# Patient Record
Sex: Female | Born: 1940
Health system: Southern US, Community
[De-identification: ages and names within clinical notes are randomized; demographics above are authoritative.]

## PROBLEM LIST (undated history)

## (undated) DIAGNOSIS — E669 Obesity, unspecified: Secondary | ICD-10-CM

## (undated) DIAGNOSIS — Z9289 Personal history of other medical treatment: Secondary | ICD-10-CM

## (undated) DIAGNOSIS — K921 Melena: Secondary | ICD-10-CM

## (undated) DIAGNOSIS — C20 Malignant neoplasm of rectum: Secondary | ICD-10-CM

## (undated) DIAGNOSIS — C49A0 Gastrointestinal stromal tumor, unspecified site: Secondary | ICD-10-CM

## (undated) DIAGNOSIS — R601 Generalized edema: Secondary | ICD-10-CM

## (undated) DIAGNOSIS — K922 Gastrointestinal hemorrhage, unspecified: Secondary | ICD-10-CM

## (undated) DIAGNOSIS — D509 Iron deficiency anemia, unspecified: Secondary | ICD-10-CM

## (undated) DIAGNOSIS — D500 Iron deficiency anemia secondary to blood loss (chronic): Secondary | ICD-10-CM

## (undated) DIAGNOSIS — K2970 Gastritis, unspecified, without bleeding: Secondary | ICD-10-CM

## (undated) DIAGNOSIS — K219 Gastro-esophageal reflux disease without esophagitis: Secondary | ICD-10-CM

## (undated) DIAGNOSIS — K649 Unspecified hemorrhoids: Secondary | ICD-10-CM

## (undated) DIAGNOSIS — N179 Acute kidney failure, unspecified: Secondary | ICD-10-CM

## (undated) HISTORY — PX: ABDOMINAL HYSTERECTOMY: SHX81

## (undated) HISTORY — DX: Acute kidney failure, unspecified: N17.9

## (undated) HISTORY — DX: Personal history of other medical treatment: Z92.89

## (undated) HISTORY — DX: Generalized edema: R60.1

## (undated) HISTORY — DX: Unspecified hemorrhoids: K64.9

## (undated) HISTORY — DX: Gastrointestinal hemorrhage, unspecified: K92.2

## (undated) HISTORY — DX: Gastrointestinal stromal tumor, unspecified site: C49.A0

## (undated) HISTORY — DX: Iron deficiency anemia, unspecified: D50.9

## (undated) HISTORY — DX: Melena: K92.1

## (undated) HISTORY — DX: Gastritis, unspecified, without bleeding: K29.70

## (undated) HISTORY — DX: Malignant neoplasm of rectum: C20

## (undated) HISTORY — DX: Iron deficiency anemia secondary to blood loss (chronic): D50.0

---

## 2012-03-31 ENCOUNTER — Emergency Department (HOSPITAL_COMMUNITY): Payer: Medicare Other

## 2012-03-31 ENCOUNTER — Inpatient Hospital Stay (HOSPITAL_COMMUNITY)
Admission: EM | Admit: 2012-03-31 | Discharge: 2012-04-06 | DRG: 375 | Disposition: A | Discharge status: Home Health | Payer: Medicare Other | Attending: Internal Medicine | Admitting: Internal Medicine

## 2012-03-31 ENCOUNTER — Encounter (HOSPITAL_COMMUNITY): Payer: Self-pay | Admitting: Nurse Practitioner

## 2012-03-31 DIAGNOSIS — R5383 Other fatigue: Secondary | ICD-10-CM | POA: Diagnosis not present

## 2012-03-31 DIAGNOSIS — R339 Retention of urine, unspecified: Secondary | ICD-10-CM | POA: Diagnosis present

## 2012-03-31 DIAGNOSIS — D490 Neoplasm of unspecified behavior of digestive system: Secondary | ICD-10-CM | POA: Diagnosis not present

## 2012-03-31 DIAGNOSIS — J9819 Other pulmonary collapse: Secondary | ICD-10-CM | POA: Diagnosis not present

## 2012-03-31 DIAGNOSIS — E440 Moderate protein-calorie malnutrition: Secondary | ICD-10-CM | POA: Diagnosis present

## 2012-03-31 DIAGNOSIS — D500 Iron deficiency anemia secondary to blood loss (chronic): Secondary | ICD-10-CM | POA: Diagnosis present

## 2012-03-31 DIAGNOSIS — K2970 Gastritis, unspecified, without bleeding: Secondary | ICD-10-CM

## 2012-03-31 DIAGNOSIS — K5730 Diverticulosis of large intestine without perforation or abscess without bleeding: Secondary | ICD-10-CM | POA: Diagnosis present

## 2012-03-31 DIAGNOSIS — D649 Anemia, unspecified: Secondary | ICD-10-CM

## 2012-03-31 DIAGNOSIS — J90 Pleural effusion, not elsewhere classified: Secondary | ICD-10-CM | POA: Diagnosis not present

## 2012-03-31 DIAGNOSIS — E8809 Other disorders of plasma-protein metabolism, not elsewhere classified: Secondary | ICD-10-CM | POA: Diagnosis present

## 2012-03-31 DIAGNOSIS — K922 Gastrointestinal hemorrhage, unspecified: Secondary | ICD-10-CM | POA: Diagnosis present

## 2012-03-31 DIAGNOSIS — R630 Anorexia: Secondary | ICD-10-CM | POA: Diagnosis present

## 2012-03-31 DIAGNOSIS — Z452 Encounter for adjustment and management of vascular access device: Secondary | ICD-10-CM | POA: Diagnosis not present

## 2012-03-31 DIAGNOSIS — E876 Hypokalemia: Secondary | ICD-10-CM | POA: Diagnosis present

## 2012-03-31 DIAGNOSIS — R0989 Other specified symptoms and signs involving the circulatory and respiratory systems: Secondary | ICD-10-CM | POA: Diagnosis not present

## 2012-03-31 DIAGNOSIS — D72829 Elevated white blood cell count, unspecified: Secondary | ICD-10-CM | POA: Diagnosis present

## 2012-03-31 DIAGNOSIS — R600 Localized edema: Secondary | ICD-10-CM | POA: Diagnosis present

## 2012-03-31 DIAGNOSIS — D509 Iron deficiency anemia, unspecified: Secondary | ICD-10-CM | POA: Diagnosis not present

## 2012-03-31 DIAGNOSIS — R609 Edema, unspecified: Secondary | ICD-10-CM | POA: Diagnosis present

## 2012-03-31 DIAGNOSIS — K649 Unspecified hemorrhoids: Secondary | ICD-10-CM | POA: Diagnosis present

## 2012-03-31 DIAGNOSIS — Z6841 Body Mass Index (BMI) 40.0 and over, adult: Secondary | ICD-10-CM | POA: Diagnosis not present

## 2012-03-31 DIAGNOSIS — D481 Neoplasm of uncertain behavior of connective and other soft tissue: Secondary | ICD-10-CM | POA: Diagnosis not present

## 2012-03-31 DIAGNOSIS — C20 Malignant neoplasm of rectum: Secondary | ICD-10-CM

## 2012-03-31 DIAGNOSIS — Z7982 Long term (current) use of aspirin: Secondary | ICD-10-CM

## 2012-03-31 DIAGNOSIS — A048 Other specified bacterial intestinal infections: Secondary | ICD-10-CM | POA: Diagnosis not present

## 2012-03-31 DIAGNOSIS — I509 Heart failure, unspecified: Secondary | ICD-10-CM | POA: Diagnosis not present

## 2012-03-31 DIAGNOSIS — R195 Other fecal abnormalities: Secondary | ICD-10-CM

## 2012-03-31 DIAGNOSIS — R9431 Abnormal electrocardiogram [ECG] [EKG]: Secondary | ICD-10-CM | POA: Diagnosis present

## 2012-03-31 DIAGNOSIS — N179 Acute kidney failure, unspecified: Secondary | ICD-10-CM | POA: Diagnosis not present

## 2012-03-31 DIAGNOSIS — R0602 Shortness of breath: Secondary | ICD-10-CM | POA: Diagnosis not present

## 2012-03-31 DIAGNOSIS — R5381 Other malaise: Secondary | ICD-10-CM | POA: Diagnosis not present

## 2012-03-31 HISTORY — DX: Obesity, unspecified: E66.9

## 2012-03-31 HISTORY — DX: Gastro-esophageal reflux disease without esophagitis: K21.9

## 2012-03-31 LAB — CBC WITH DIFFERENTIAL/PLATELET
Basophils Relative: 2 % — ABNORMAL HIGH (ref 0–1)
Eosinophils Absolute: 0.1 10*3/uL (ref 0.0–0.7)
HCT: 12.5 % — ABNORMAL LOW (ref 36.0–46.0)
Hemoglobin: 2.9 g/dL — CL (ref 12.0–15.0)
MCH: 12.1 pg — ABNORMAL LOW (ref 26.0–34.0)
MCHC: 23.2 g/dL — ABNORMAL LOW (ref 30.0–36.0)
Monocytes Absolute: 1.8 10*3/uL — ABNORMAL HIGH (ref 0.1–1.0)
Neutro Abs: 3.9 10*3/uL (ref 1.7–7.7)

## 2012-03-31 LAB — COMPREHENSIVE METABOLIC PANEL
AST: 25 U/L (ref 0–37)
BUN: 14 mg/dL (ref 6–23)
CO2: 24 mEq/L (ref 19–32)
Chloride: 105 mEq/L (ref 96–112)
Creatinine, Ser: 0.96 mg/dL (ref 0.50–1.10)
GFR calc non Af Amer: 59 mL/min — ABNORMAL LOW (ref >90)
Total Bilirubin: 0.4 mg/dL (ref 0.3–1.2)

## 2012-03-31 LAB — RETICULOCYTES
RBC.: 2.48 MIL/uL — ABNORMAL LOW (ref 3.87–5.11)
Retic Ct Pct: 2.4 % (ref 0.4–3.1)

## 2012-03-31 LAB — PREPARE RBC (CROSSMATCH)

## 2012-03-31 LAB — OCCULT BLOOD, POC DEVICE: Fecal Occult Bld: POSITIVE — AB

## 2012-03-31 LAB — PRO B NATRIURETIC PEPTIDE: Pro B Natriuretic peptide (BNP): 2414 pg/mL — ABNORMAL HIGH (ref 0–125)

## 2012-03-31 MED ORDER — SODIUM CHLORIDE 0.9 % IV BOLUS (SEPSIS): 1000.0000 mL | Freq: Once | INTRAVENOUS | Status: DC

## 2012-03-31 NOTE — ED Provider Notes (Addendum)
History     CSN: [id number]  Arrival date & time 03/31/12  [id number]   First MD Initiated Contact with Patient 03/31/12 1959      Chief Complaint  Patient presents with  . Weakness    (Consider location/radiation/quality/duration/timing/severity/associated sxs/prior treatment) Patient is a 72 y.o. female presenting with weakness. The history is provided by the patient (the pt complains of weakness for 3 months). No language interpreter was used.  Weakness Primary symptoms do not include headaches or seizures. The symptoms began more than 1 week ago. The symptoms are worsening. The neurological symptoms are diffuse. Context: nothing.  Additional symptoms include weakness. Additional symptoms do not include hallucinations.    History reviewed. No pertinent past medical history.  History reviewed. No pertinent past surgical history.  History reviewed. No pertinent family history.  History  Substance Use Topics  . Smoking status: Never Smoker   . Smokeless tobacco: Not on file  . Alcohol Use: No    OB History    Grav Para Term Preterm Abortions TAB SAB Ect Mult Living                  Review of Systems  Constitutional: Negative for fatigue.  HENT: Negative for congestion, sinus pressure and ear discharge.   Eyes: Negative for discharge.  Respiratory: Negative for cough.   Cardiovascular: Negative for chest pain.  Gastrointestinal: Negative for abdominal pain and diarrhea.  Genitourinary: Negative for frequency and hematuria.  Musculoskeletal: Negative for back pain.       Edema  Skin: Negative for rash.  Neurological: Positive for weakness. Negative for seizures and headaches.  Hematological: Negative.   Psychiatric/Behavioral: Negative for hallucinations.    Allergies  Review of patient's allergies indicates no known allergies.  Home Medications   Current Outpatient Rx  Name  Route  Sig  Dispense  Refill  . ASPIRIN BUFFERED 325 MG PO TABS   Oral   Take 325 mg  by mouth daily.           BP 138/54  Pulse 84  Temp 98.6 F (37 C) (Oral)  Resp 17  SpO2 100%  Physical Exam  Constitutional: She is oriented to person, place, and time. She appears well-developed.  HENT:  Head: Normocephalic and atraumatic.  Eyes: Conjunctivae normal and EOM are normal. No scleral icterus.  Neck: Neck supple. No thyromegaly present.  Cardiovascular: Normal rate and regular rhythm.  Exam reveals no gallop and no friction rub.   No murmur heard. Pulmonary/Chest: No stridor. She has no wheezes. She has no rales. She exhibits no tenderness.  Abdominal: She exhibits no distension. There is no tenderness. There is no rebound.  Genitourinary: Guaiac positive stool.       Mass felt anterior on rectal exam  Musculoskeletal: Normal range of motion. She exhibits edema.  Lymphadenopathy:    She has no cervical adenopathy.  Neurological: She is oriented to person, place, and time. Coordination normal.  Skin: No rash noted. No erythema.  Psychiatric: She has a normal mood and affect. Her behavior is normal.    ED Course  IO LINE INSERTION Performed by: Lezli Danek L Authorized by: Stepfanie Yott L Comments: External jugular access done to the left neck with 20 gauge needle.  No complication   (including critical care time)  Labs Reviewed  CBC WITH DIFFERENTIAL - Abnormal; Notable for the following:    RBC 2.39 (*)     Hemoglobin 2.9 (*)     HCT 12.5 (*)  MCV 52.3 (*)     MCH 12.1 (*)     MCHC 23.2 (*)     RDW 26.0 (*)     Platelets 422 (*)     Monocytes Relative 25 (*)     Basophils Relative 2 (*)     Monocytes Absolute 1.8 (*)     All other components within normal limits  COMPREHENSIVE METABOLIC PANEL - Abnormal; Notable for the following:    Glucose, Bld 108 (*)     Calcium 8.2 (*)     Albumin 2.8 (*)     Alkaline Phosphatase 120 (*)     GFR calc non Af Amer 59 (*)     GFR calc Af Amer 68 (*)     All other components within normal limits    PRO B NATRIURETIC PEPTIDE - Abnormal; Notable for the following:    Pro B Natriuretic peptide (BNP) 2414.0 (*)     All other components within normal limits  RETICULOCYTES - Abnormal; Notable for the following:    RBC. 2.48 (*)     All other components within normal limits  URINALYSIS, ROUTINE W REFLEX MICROSCOPIC  VITAMIN B12  FOLATE  IRON AND TIBC  FERRITIN  TYPE AND SCREEN  PREPARE RBC (CROSSMATCH)   Dg Chest Port 1 View  03/31/2012  *RADIOLOGY REPORT*  Clinical Data: Shortness of breath.  PORTABLE CHEST - 1 VIEW  Comparison: None.  Findings: There is marked enlargement of the cardiopericardial silhouette with vascular congestion.  No consolidative process, pneumothorax or effusion is identified.  Study is limited by the patient's body habitus and portable technique.  IMPRESSION: Marked enlargement of the cardiopericardial silhouette compatible with cardiomegaly and/or pericardial effusion.  Pulmonary vascular congestion without frank edema.   Original Report Authenticated By: Orlean Patten, M.D.      1. Anemia      CRITICAL CARE Performed by: Zaiya Annunziato L   Total critical care time:40  Critical care time was exclusive of separately billable procedures and treating other patients.  Critical care was necessary to treat or prevent imminent or life-threatening deterioration.  Critical care was time spent personally by me on the following activities: development of treatment plan with patient and/or surrogate as well as nursing, discussions with consultants, evaluation of patient's response to treatment, examination of patient, obtaining history from patient or surrogate, ordering and performing treatments and interventions, ordering and review of laboratory studies, ordering and review of radiographic studies, pulse oximetry and re-evaluation of patient's condition.  MDM          Maudry Diego, MD 03/31/12 2324  Maudry Diego, MD 03/31/12 2326

## 2012-03-31 NOTE — ED Notes (Signed)
hgb 2.9 hct 12.5 mcb 52.3 called from lab, edp made aware

## 2012-03-31 NOTE — ED Notes (Signed)
Per ems: pt began to have diarrhea and increased weakness tonight, but has been having weakness since October. Pt from home. On arrival to scene, ems found pt with abd swelling, soft abd to touch, and edema to BLE from knee up, none noted around her feet. Lung sounds clear. VSS en route, bp 140/58. Pt denies any medical history.

## 2012-03-31 NOTE — ED Notes (Signed)
Unable to gain iv access, iv team paged

## 2012-04-01 ENCOUNTER — Encounter (HOSPITAL_COMMUNITY): Payer: Self-pay | Admitting: Internal Medicine

## 2012-04-01 ENCOUNTER — Inpatient Hospital Stay (HOSPITAL_COMMUNITY): Payer: Medicare Other

## 2012-04-01 DIAGNOSIS — R9431 Abnormal electrocardiogram [ECG] [EKG]: Secondary | ICD-10-CM | POA: Diagnosis present

## 2012-04-01 DIAGNOSIS — D509 Iron deficiency anemia, unspecified: Secondary | ICD-10-CM

## 2012-04-01 DIAGNOSIS — R600 Localized edema: Secondary | ICD-10-CM | POA: Diagnosis present

## 2012-04-01 DIAGNOSIS — R195 Other fecal abnormalities: Secondary | ICD-10-CM | POA: Diagnosis present

## 2012-04-01 DIAGNOSIS — K649 Unspecified hemorrhoids: Secondary | ICD-10-CM | POA: Diagnosis present

## 2012-04-01 DIAGNOSIS — K922 Gastrointestinal hemorrhage, unspecified: Secondary | ICD-10-CM

## 2012-04-01 DIAGNOSIS — I509 Heart failure, unspecified: Secondary | ICD-10-CM

## 2012-04-01 DIAGNOSIS — N179 Acute kidney failure, unspecified: Secondary | ICD-10-CM | POA: Diagnosis present

## 2012-04-01 DIAGNOSIS — D649 Anemia, unspecified: Secondary | ICD-10-CM

## 2012-04-01 DIAGNOSIS — D500 Iron deficiency anemia secondary to blood loss (chronic): Secondary | ICD-10-CM | POA: Diagnosis present

## 2012-04-01 DIAGNOSIS — Z9289 Personal history of other medical treatment: Secondary | ICD-10-CM

## 2012-04-01 HISTORY — DX: Personal history of other medical treatment: Z92.89

## 2012-04-01 LAB — BASIC METABOLIC PANEL
CO2: 27 mEq/L (ref 19–32)
Calcium: 8 mg/dL — ABNORMAL LOW (ref 8.4–10.5)
Chloride: 110 mEq/L (ref 96–112)
GFR calc Af Amer: 75 mL/min — ABNORMAL LOW (ref >90)
Sodium: 143 mEq/L (ref 135–145)

## 2012-04-01 LAB — URINE CULTURE
Colony Count: NO GROWTH
Culture: NO GROWTH

## 2012-04-01 LAB — PROTIME-INR
INR: 1.47 (ref 0.00–1.49)
Prothrombin Time: 17.4 seconds — ABNORMAL HIGH (ref 11.6–15.2)

## 2012-04-01 LAB — CBC
HCT: 22.2 % — ABNORMAL LOW (ref 36.0–46.0)
Hemoglobin: 9.3 g/dL — ABNORMAL LOW (ref 12.0–15.0)
MCH: 21.8 pg — ABNORMAL LOW (ref 26.0–34.0)
MCHC: 29.7 g/dL — ABNORMAL LOW (ref 30.0–36.0)
MCV: 69.2 fL — ABNORMAL LOW (ref 78.0–100.0)
Platelets: ADEQUATE 10*3/uL (ref 150–400)
RBC: 4.26 MIL/uL (ref 3.87–5.11)
WBC: 8.9 10*3/uL (ref 4.0–10.5)

## 2012-04-01 LAB — FERRITIN: Ferritin: 8 ng/mL — ABNORMAL LOW (ref 10–291)

## 2012-04-01 LAB — POCT I-STAT 3, ART BLOOD GAS (G3+)
O2 Saturation: 59 %
TCO2: 28 mmol/L (ref 0–100)
pCO2 arterial: 48.3 mmHg — ABNORMAL HIGH (ref 35.0–45.0)
pH, Arterial: 7.356 (ref 7.350–7.450)
pO2, Arterial: 32 mmHg — CL (ref 80.0–100.0)

## 2012-04-01 LAB — IRON AND TIBC

## 2012-04-01 LAB — VITAMIN B12: Vitamin B-12: 950 pg/mL — ABNORMAL HIGH (ref 211–911)

## 2012-04-01 LAB — GLUCOSE, CAPILLARY
Glucose-Capillary: 85 mg/dL (ref 70–99)
Glucose-Capillary: 101 mg/dL — ABNORMAL HIGH (ref 70–99)

## 2012-04-01 LAB — URINE MICROSCOPIC-ADD ON

## 2012-04-01 LAB — URINALYSIS, ROUTINE W REFLEX MICROSCOPIC
Bilirubin Urine: NEGATIVE
Glucose, UA: NEGATIVE mg/dL
Hgb urine dipstick: NEGATIVE
Protein, ur: 30 mg/dL — AB
Urobilinogen, UA: 1 mg/dL (ref 0.0–1.0)

## 2012-04-01 LAB — TSH: TSH: 6.106 u[IU]/mL — ABNORMAL HIGH (ref 0.350–4.500)

## 2012-04-01 LAB — MRSA PCR SCREENING: MRSA by PCR: NEGATIVE

## 2012-04-01 LAB — ABO/RH: ABO/RH(D): B POS

## 2012-04-01 MED ORDER — FUROSEMIDE 10 MG/ML IJ SOLN
INTRAMUSCULAR | Status: AC
  Administered 2012-04-01: 20 mg
  Filled 2012-04-01: qty 2

## 2012-04-01 MED ORDER — PEG-KCL-NACL-NASULF-NA ASC-C 100 G PO SOLR
1.0000 | Freq: Once | ORAL | Status: AC
  Administered 2012-04-01: 100 g via ORAL
  Filled 2012-04-01: qty 1

## 2012-04-01 MED ORDER — ACETAMINOPHEN 325 MG PO TABS: 650.0000 mg | ORAL_TABLET | Freq: Four times a day (QID) | ORAL | Status: DC | PRN

## 2012-04-01 MED ORDER — PANTOPRAZOLE SODIUM 40 MG PO TBEC
40.0000 mg | DELAYED_RELEASE_TABLET | Freq: Every day | ORAL | Status: DC
  Administered 2012-04-01 – 2012-04-06 (×5): 40 mg via ORAL
  Filled 2012-04-01 (×5): qty 1

## 2012-04-01 MED ORDER — FUROSEMIDE 10 MG/ML IJ SOLN: 20.0000 mg | INTRAMUSCULAR | Status: DC

## 2012-04-01 MED ORDER — SODIUM CHLORIDE 0.9 % IJ SOLN
3.0000 mL | Freq: Two times a day (BID) | INTRAMUSCULAR | Status: DC
  Administered 2012-04-01: 3 mL via INTRAVENOUS

## 2012-04-01 MED ORDER — PANTOPRAZOLE SODIUM 40 MG IV SOLR
40.0000 mg | Freq: Two times a day (BID) | INTRAVENOUS | Status: DC
  Administered 2012-04-01: 40 mg via INTRAVENOUS
  Filled 2012-04-01 (×4): qty 40

## 2012-04-01 MED ORDER — ONDANSETRON HCL 4 MG/2ML IJ SOLN: 4.0000 mg | Freq: Four times a day (QID) | INTRAMUSCULAR | Status: DC | PRN

## 2012-04-01 MED ORDER — ACETAMINOPHEN 650 MG RE SUPP: 650.0000 mg | Freq: Four times a day (QID) | RECTAL | Status: DC | PRN

## 2012-04-01 MED ORDER — ONDANSETRON HCL 4 MG PO TABS: 4.0000 mg | ORAL_TABLET | Freq: Four times a day (QID) | ORAL | Status: DC | PRN

## 2012-04-01 MED ORDER — METOCLOPRAMIDE HCL 5 MG/ML IJ SOLN
10.0000 mg | Freq: Once | INTRAMUSCULAR | Status: AC
  Administered 2012-04-01: 10 mg via INTRAVENOUS
  Filled 2012-04-01: qty 2

## 2012-04-01 MED ORDER — OXYCODONE-ACETAMINOPHEN 5-325 MG PO TABS: 1.0000 | ORAL_TABLET | Freq: Once | ORAL | Status: DC

## 2012-04-01 MED ORDER — SODIUM CHLORIDE 0.9 % IV SOLN
INTRAVENOUS | Status: DC
  Administered 2012-04-01: 20 mL/h via INTRAVENOUS
  Administered 2012-04-02: 22:00:00 via INTRAVENOUS
  Administered 2012-04-02: 20 mL/h via INTRAVENOUS

## 2012-04-01 MED ORDER — SODIUM CHLORIDE 0.9 % IJ SOLN
3.0000 mL | Freq: Two times a day (BID) | INTRAMUSCULAR | Status: DC
  Administered 2012-04-01: 3 mL via INTRAVENOUS
  Administered 2012-04-01 – 2012-04-02 (×2): 10 mL via INTRAVENOUS

## 2012-04-01 MED ORDER — HYOSCYAMINE SULFATE 0.125 MG SL SUBL
0.2500 mg | SUBLINGUAL_TABLET | Freq: Once | SUBLINGUAL | Status: DC
  Filled 2012-04-01: qty 2

## 2012-04-01 NOTE — Progress Notes (Signed)
UR COMPLETED  

## 2012-04-01 NOTE — Progress Notes (Signed)
[facility] Progress Note [place] TEAM 1 - Stepdown/ICU TEAM   Shannon Terry [id number] DOB: [birth year] DOA: 03/31/2012 PCP: Pcp Not In System  Brief narrative: 72 year old female patient endorsed chronic fatigue and weakness since October 2013 as well as chronic intermittent bleeding per rectum. This has been associated with rectal pain and a sensation of bulging. She presented to the hospital because of profound weakness and fatigue. In the emergency department her hemoglobin was 2.9. Fecal occult blood was positive. Patient has also had recurrent headaches for which she takes aspirin regularly. She was otherwise hemodynamically stable at presentation. She has also had increasing lower extremity edema over several months with shortness of breath on exertion without associated chest pain. She was admitted to step down.  Assessment/Plan:  Microcytic severe Iron deficiency anemia due to suspected chronic blood loss -Hgb up to 6.6 after 4 units PRBC's - tx add'l 2 units today  -Iron <10- will need IV iron later this admission after transfusions complete -TSH pending  GI bleed/Fecal occult blood test positive/?  Hemorrhoids -GI following -Endoscopy planned for 1/28 -Differential: hemorrhoids vs malignancy  Bilateral lower extremity edema -Likely 2/2 profound chronic anemia w/ assoc low intravascular oncotic pressure -No indication for lower extremity venous duplex at this time as sx are B and explained by anemia  Acute renal failure -Baseline Creat unknown but GFR has increased after transfusion so likley renal dysfunction acute and 2/2 to anemia/low perfusion  Nonspecific abnormal electrocardiogram / unspecified ventricular conduction delay -Hold ECHO until hgb stable  Healthcare screening/monitoring -By choice pt does not have or follow w/ PCP - has NOT had mammogram EVER but has undergone remote hysterectomy for fibroids - likewise, has had no colon CA screening   DVT  prophylaxis: SCDs Code Status: Full Family Communication: Patient and daughters Disposition Plan: Stepdown  Consultants: Gastroenterology  Procedures: Insertion of central venous catheter 04/01/2012 by PCCM  Antibiotics: None  HPI/Subjective: Patient alert and reports feeling better than prior to admission otherwise no specific complaints verbalized.   Objective: Blood pressure 161/71, pulse 81, temperature 97.7 F (36.5 C), temperature source Oral, resp. rate 15, height 5\' 4"  (1.626 m), weight 108.6 kg (239 lb 6.7 oz), SpO2 100.00%.  Intake/Output Summary (Last 24 hours) at 04/01/12 1217 Last data filed at 04/01/12 1100  Gross per 24 hour  Intake 1602.5 ml  Output    775 ml  Net  827.5 ml     Exam: Follow up exam completed  Data Reviewed: Basic Metabolic Panel:  Lab 0000000 0500 03/31/12 2039  NA 143 140  K 3.8 4.1  CL 110 105  CO2 27 24  GLUCOSE 89 108*  BUN 13 14  CREATININE 0.89 0.96  CALCIUM 8.0* 8.2*  MG -- --  PHOS -- --   Liver Function Tests:  Lab 03/31/12 2039  AST 25  ALT 16  ALKPHOS 120*  BILITOT 0.4  PROT 6.1  ALBUMIN 2.8*   CBC:  Lab 04/01/12 0500 03/31/12 2039  WBC 8.9 7.2  NEUTROABS -- 3.9  HGB 6.6* 2.9*  HCT 22.2* 12.5*  MCV 64.9* 52.3*  PLT PLATELET CLUMPS NOTED ON SMEAR, COUNT APPEARS ADEQUATE 422*    Basename 03/31/12 2039  PROBNP 2414.0*   CBG:  Lab 04/01/12 0740 04/01/12 0104  GLUCAP 85 80    Recent Results (from the past 240 hour(s))  MRSA PCR SCREENING     Status: Normal   Collection Time   04/01/12  1:00 AM  Component Value Range Status Comment   MRSA by PCR NEGATIVE  NEGATIVE Final      Studies:  Recent x-ray studies have been reviewed in detail by the Attending Physician  Scheduled Meds:  Reviewed in detail by the Attending Physician   Erin Hearing, ANP [facility] Office  [phone] Pager [phone]  On-Call/Text Page:      [email or web address]      password [email or web address]  If 7PM-7AM,  please contact night-coverage [email or web address] Password [facility] 04/01/2012, 12:17 PM   LOS: 1 day   I have personally examined this patient and reviewed the entire database. I have reviewed the above note, made any necessary editorial changes, and agree with its content.  Cherene Altes, MD [facility]

## 2012-04-01 NOTE — Progress Notes (Signed)
Patient admitted to 2100 from at approx. 0100. Patient alert and oriented. Family at bedside. Patient has PIV 20g to LEJ. 1st unit of blood transfusing. Site unremarkable. 0230 upon completion of 1st unit of blood patient complains of pain at the site. Area is slightly swollen and raised. Difficult to flush. Patient complains of pain to area upon palpation. [facility] paged and informed concerning infiltrated EJ. RN also spoke with Dr. Hal Hope concerning this event.  Will hold 2nd unit of blood  for now until another PIV or CVL is placed. Patient VS remain stable at this time. Will continue to closely monitor.

## 2012-04-01 NOTE — Procedures (Signed)
Central Venous Catheter Insertion Procedure Note Gana Gilgenbach [id number] 15-Dec-1940  Procedure: Insertion of Central Venous Catheter Indications: Drug and/or fluid administration and Frequent blood sampling  Procedure Details Consent: Risks of procedure as well as the alternatives and risks of each were explained to the (patient/caregiver).  Consent for procedure obtained. Time Out: Verified patient identification, verified procedure, site/side was marked, verified correct patient position, special equipment/implants available, medications/allergies/relevent history reviewed, required imaging and test results available.  Performed  Maximum sterile technique was used including antiseptics, cap, gloves, gown, hand hygiene, mask and sheet. Skin prep: Chlorhexidine; local anesthetic administered A antimicrobial bonded/coated triple lumen catheter was placed in the right internal jugular vein using the Seldinger technique.  Evaluation Blood flow good Complications: No apparent complications Patient did tolerate procedure well. Chest X-ray ordered to verify placement.  CXR: pending.  Waynetta Pean, M.D. Pulmonary and Critical Care Medicine Call [place] with questions [phone] 04/01/2012, 4:39 AM

## 2012-04-01 NOTE — ED Notes (Signed)
Kakrakandy MD at bedside.  

## 2012-04-01 NOTE — H&P (Addendum)
Temilola Hautala is an 72 y.o. female.   Patient was seen and examined on April 01, 2012. PCP - none. Chief Complaint:  Weakness and fatigue. HPI:  72 year-old female who has not been to a physician for many years has been experiencing weakness since last October. Last week she got very weak and had to use walker to move around. But since yesterday she was unable to even get out off the bed. In the ER patient's hemoglobin was found to be around 2.9 and at this time has been admitted for further management. Patient's 2 focal blood has been positive. Patient states that she has been having some pretty stool for last many months which she has attributed to hemorrhoids. Patient also frequently takes aspirin for headaches. Patient otherwise presently is hemodynamically stable. Patient also has been noticing increasing lower extremity edema since last few months with shortness of breath on exertion. Denies any chest pain. Patient does have loose stools for last many days. Denies having used any antibiotics. Denies any abdominal pain nausea vomiting.  History reviewed. No pertinent past medical history.  Past Surgical History  Procedure Date  . Abdominal hysterectomy     Family History  Problem Relation Age of Onset  . Diabetes Mellitus II Father   . Leukemia Brother    Social History:  reports that she has never smoked. She does not have any smokeless tobacco history on file. She reports that she does not drink alcohol or use illicit drugs.  Allergies: No Known Allergies   (Not in a hospital admission)  Results for orders placed during the hospital encounter of 03/31/12 (from the past 48 hour(s))  CBC WITH DIFFERENTIAL     Status: Abnormal   Collection Time   03/31/12  8:39 PM      Component Value Range Comment   WBC 7.2  4.0 - 10.5 K/uL    RBC 2.39 (*) 3.87 - 5.11 MIL/uL    Hemoglobin 2.9 (*) 12.0 - 15.0 g/dL    HCT 12.5 (*) 36.0 - 46.0 %    MCV 52.3 (*) 78.0 - 100.0 fL    MCH 12.1 (*) 26.0  - 34.0 pg    MCHC 23.2 (*) 30.0 - 36.0 g/dL    RDW 26.0 (*) 11.5 - 15.5 %    Platelets 422 (*) 150 - 400 K/uL    Neutrophils Relative 53  43 - 77 %    Lymphocytes Relative 18  12 - 46 %    Monocytes Relative 25 (*) 3 - 12 %    Eosinophils Relative 2  0 - 5 %    Basophils Relative 2 (*) 0 - 1 %    Neutro Abs 3.9  1.7 - 7.7 K/uL    Lymphs Abs 1.3  0.7 - 4.0 K/uL    Monocytes Absolute 1.8 (*) 0.1 - 1.0 K/uL    Eosinophils Absolute 0.1  0.0 - 0.7 K/uL    Basophils Absolute 0.1  0.0 - 0.1 K/uL    RBC Morphology POLYCHROMASIA PRESENT      WBC Morphology ATYPICAL MONONUCLEAR CELLS      Smear Review PLATELETS APPEAR ADEQUATE     COMPREHENSIVE METABOLIC PANEL     Status: Abnormal   Collection Time   03/31/12  8:39 PM      Component Value Range Comment   Sodium 140  135 - 145 mEq/L    Potassium 4.1  3.5 - 5.1 mEq/L    Chloride 105  96 - 112  mEq/L    CO2 24  19 - 32 mEq/L    Glucose, Bld 108 (*) 70 - 99 mg/dL    BUN 14  6 - 23 mg/dL    Creatinine, Ser 0.96  0.50 - 1.10 mg/dL    Calcium 8.2 (*) 8.4 - 10.5 mg/dL    Total Protein 6.1  6.0 - 8.3 g/dL    Albumin 2.8 (*) 3.5 - 5.2 g/dL    AST 25  0 - 37 U/L    ALT 16  0 - 35 U/L    Alkaline Phosphatase 120 (*) 39 - 117 U/L    Total Bilirubin 0.4  0.3 - 1.2 mg/dL    GFR calc non Af Amer 59 (*) >90 mL/min    GFR calc Af Amer 68 (*) >90 mL/min   PRO B NATRIURETIC PEPTIDE     Status: Abnormal   Collection Time   03/31/12  8:39 PM      Component Value Range Comment   Pro B Natriuretic peptide (BNP) 2414.0 (*) 0 - 125 pg/mL   RETICULOCYTES     Status: Abnormal   Collection Time   03/31/12 10:45 PM      Component Value Range Comment   Retic Ct Pct 2.4  0.4 - 3.1 %    RBC. 2.48 (*) 3.87 - 5.11 MIL/uL    Retic Count, Manual 59.5  19.0 - 186.0 K/uL   TYPE AND SCREEN     Status: Normal (Preliminary result)   Collection Time   03/31/12 10:55 PM      Component Value Range Comment   ABO/RH(D) B POS      Antibody Screen NEG      Sample Expiration  04/03/2012      Unit Number [id number]      Blood Component Type RED CELLS,LR      Unit division 00      Status of Unit ALLOCATED      Transfusion Status OK TO TRANSFUSE      Crossmatch Result Compatible      Unit Number [id number]      Blood Component Type RED CELLS,LR      Unit division 00      Status of Unit ALLOCATED      Transfusion Status OK TO TRANSFUSE      Crossmatch Result Compatible      Unit Number [id number]      Blood Component Type RED CELLS,LR      Unit division 00      Status of Unit ISSUED      Transfusion Status OK TO TRANSFUSE      Crossmatch Result Compatible      Unit Number [id number]      Blood Component Type RED CELLS,LR      Unit division 00      Status of Unit ALLOCATED      Transfusion Status OK TO TRANSFUSE      Crossmatch Result Compatible      Unit Number [id number]      Blood Component Type RED CELLS,LR      Unit division 00      Status of Unit ALLOCATED      Transfusion Status OK TO TRANSFUSE      Crossmatch Result Compatible      Unit Number [id number]      Blood Component Type RED CELLS,LR      Unit division 00      Status of Unit ALLOCATED  Transfusion Status OK TO TRANSFUSE      Crossmatch Result Compatible     PREPARE RBC (CROSSMATCH)     Status: Normal   Collection Time   03/31/12 10:55 PM      Component Value Range Comment   Order Confirmation ORDER PROCESSED BY BLOOD BANK     ABO/RH     Status: Normal (Preliminary result)   Collection Time   03/31/12 10:55 PM      Component Value Range Comment   ABO/RH(D) B POS     OCCULT BLOOD, POC DEVICE     Status: Abnormal   Collection Time   03/31/12 11:21 PM      Component Value Range Comment   Fecal Occult Bld POSITIVE (*) NEGATIVE    Dg Chest Port 1 View  03/31/2012  *RADIOLOGY REPORT*  Clinical Data: Shortness of breath.  PORTABLE CHEST - 1 VIEW  Comparison: None.  Findings: There is marked enlargement of the cardiopericardial silhouette with vascular  congestion.  No consolidative process, pneumothorax or effusion is identified.  Study is limited by the patient's body habitus and portable technique.  IMPRESSION: Marked enlargement of the cardiopericardial silhouette compatible with cardiomegaly and/or pericardial effusion.  Pulmonary vascular congestion without frank edema.   Original Report Authenticated By: Orlean Patten, M.D.     Review of Systems  Constitutional: Positive for malaise/fatigue.  HENT: Negative.   Eyes: Negative.   Respiratory: Positive for shortness of breath.   Cardiovascular: Negative.   Gastrointestinal: Negative.   Genitourinary: Negative.   Musculoskeletal: Negative.   Skin: Negative.   Neurological: Positive for weakness.  Endo/Heme/Allergies: Negative.   Psychiatric/Behavioral: Negative.     Blood pressure 114/44, pulse 90, temperature 98.2 F (36.8 C), temperature source Oral, resp. rate 18, SpO2 100.00%. Physical Exam  Constitutional: She is oriented to person, place, and time. She appears well-developed and well-nourished. No distress.  HENT:  Head: Normocephalic.  Right Ear: External ear normal.  Left Ear: External ear normal.  Nose: Nose normal.  Mouth/Throat: Oropharynx is clear and moist. No oropharyngeal exudate.  Eyes: Pupils are equal, round, and reactive to light. Right eye exhibits no discharge. Left eye exhibits no discharge. No scleral icterus.       Pallor.  Neck: Normal range of motion. Neck supple.  Cardiovascular: Normal rate and regular rhythm.   Respiratory: Effort normal and breath sounds normal. No respiratory distress. She has no wheezes. She has no rales.  GI: Soft. Bowel sounds are normal.  Musculoskeletal: She exhibits edema.  Neurological: She is alert and oriented to person, place, and time.       Moves all extremities.  Skin: She is not diaphoretic. There is pallor.     Assessment/Plan #1. Symptomatic severe microcytic hypochromic anemia - patient probably has  chronic GI bleed. At this time 4 units of packed blood cells transfusion has been ordered. Recheck CBC along with INR in a.m. Since patient also has CHF if there is any shortness of breath in between transfusions then Lasix should be administered. Protonix I.V. GI consult. #2. CHF - probably worsened by anemia. Patient's chest x-ray does show large cardiac shadow concerning for cardiomegaly and pericardial effusion. At this time patient does not look like she is in tamponade. Hemodynamically stable.Check 2-D echo. Once patient's hemodynamic status is stable consider Lasix. Check Dopplers of the lower extremity to rule out DVT.  Will consult pulmonary critical care.  CODE STATUS - full code.  Madysyn Hanken N. 04/01/2012, 12:28 AM

## 2012-04-01 NOTE — Consult Note (Signed)
Patient seen, examined, and I agree with the above documentation, including the assessment and plan. Profound microcytic anemia with hx of BRBPR.  Now s/p 4U pRBC, with plans for 1 additional unit today Agree with EGD/colon if she remains stable from cardiopulm standpoint. Followup TSH, repeat Hgb

## 2012-04-01 NOTE — Consult Note (Signed)
[facility] Consult: 11:42 AM 04/01/2012   Referring Provider: Dr Hal Hope.   Primary Care Physician:  None.  No MD visits for  Years.  Primary Gastroenterologist:  none  Reason for Consultation:  Profound microcytic anemia.   HPI: Shannon Terry is a 72 y.o. female.  Fatigue and weakness at least since 12/2011.  Chronic intermittent bleeding per rectum, uses a protective pad and on some days may have to change this.  Some rectal pain and sense of bulging.  She always thought this was due to hemorrhoids.   In ED yesterday Hgb 2.9, low MCV.  BUN normal.   S/p 4 units PRBCs.  Hgb up to 6.6 now.    Anorexia but no early satiety for several months. No weight loss but has had swelling in legs and belly. No previous transfusions.  Takes 650 ASA about 3 times a week for joint pain.  No ETOH.  No hx liver disease.  No blood in urine.  Had been rx'd Iron by GYN many years ago.  Never took it. Has ice but no clay pica.  Does not use ETOH or have hx liver problems.     History reviewed. No pertinent past medical history.  Past Surgical History  Procedure Date  . Abdominal hysterectomy     Prior to Admission medications   Medication Sig Start Date End Date Taking? Authorizing Provider  aspirin 325 MG buffered tablet Take 325 mg by mouth daily.   Yes Historical Provider, MD    Scheduled Meds:    . pantoprazole  40 mg Oral Q0600  . peg 3350 powder  1 kit Oral Once  . sodium chloride  3 mL Intravenous Q12H   Infusions:    . sodium chloride 20 mL/hr (04/01/12 0700)   PRN Meds: acetaminophen, acetaminophen, ondansetron (ZOFRAN) IV, ondansetron   Allergies as of 03/31/2012  . (No Known Allergies)    Family History  Problem Relation Age of Onset  . Diabetes Mellitus II Father   . Leukemia Brother        Younger sister died of pancreatic cancer.  She was in her 12s.  History   Social History  . Marital Status: Married   Spouse Name: N/A    Number of Children: N/A  . Years of Education: N/A   Occupational History  . Not on file.   Social History Main Topics  . Smoking status: Never Smoker   . Smokeless tobacco: Not on file  . Alcohol Use: No  . Drug Use: No  . Sexually Active:      REVIEW OF SYSTEMS: Per HPI.  Relevant pos and negatives from 12 system review noted above.    PHYSICAL EXAM: Vital signs in last 24 hours: Temp:  [97.6 F (36.4 C)-99 F (37.2 C)] 97.7 F (36.5 C) (01/27 0749) Pulse Rate:  [74-114] 81  (01/27 1100) Resp:  [6-25] 15  (01/27 1100) BP: (107-161)/(43-90) 161/71 mmHg (01/27 1100) SpO2:  [98 %-100 %] 100 % (01/27 1100) Weight:  [108.6 kg (239 lb 6.7 oz)] 108.6 kg (239 lb 6.7 oz) (01/27 0800)  General: obese, AAF who is vague historian.   Head:  No signs of trauma  Eyes:  No icterus, con pale Ears:  Slightly HOH  Nose:  No discharge Mouth: many absent teeth, no sores. Neck:  No mass, TMG or bruits.  Large hematoma on left where central line, external jugular, was previously in place. Has int jugular line on right.  Lungs:  Clear B.  No labored breathing or cough.  Heart: RRR.  No MRG Abdomen:  Obese, soft, NT, ND.  Active BS.  No mass or bruits. Some pitting edema in abdominal skin.  Rectal: palpable mass vs hemorrhoids internally, red blood on padding and on exam glove   Musc/Skeltl: no joint pain.   Extremities: pedal edema  Neurologic:  No confusion, no tremor, moves all 4s.  Oriented x 3.  Skin:  No rash or sores,  No angiomata Tattoos:  none Nodes:  No cervical adenopathy.   Psych:  Pleasant, cooperative.   Intake/Output from previous day: 01/26 0701 - 01/27 0700 In: 1542.5 [I.V.:20; Blood:1512.5; IV Piggyback:10] Out: -  Intake/Output this shift: Total I/O In: 60 [I.V.:60] Out: 775 [Urine:775]  LAB RESULTS:  Basename 04/01/12 0500 03/31/12 2039  WBC 8.9 7.2  HGB 6.6* 2.9*  HCT 22.2* 12.5*  PLT PLATELET CLUMPS NOTED ON SMEAR, COUNT APPEARS  ADEQUATE 422*  MCV           52  BMET Lab Results  Component Value Date   NA 143 04/01/2012   NA 140 03/31/2012   K 3.8 04/01/2012   K 4.1 03/31/2012   CL 110 04/01/2012   CL 105 03/31/2012   CO2 27 04/01/2012   CO2 24 03/31/2012   GLUCOSE 89 04/01/2012   GLUCOSE 108* 03/31/2012   BUN 13 04/01/2012   BUN 14 03/31/2012   CREATININE 0.89 04/01/2012   CREATININE 0.96 03/31/2012   CALCIUM 8.0* 04/01/2012   CALCIUM 8.2* 03/31/2012   LFT  Basename 03/31/12 2039  PROT 6.1  ALBUMIN 2.8*  AST 25  ALT 16  ALKPHOS 120*  BILITOT 0.4  BILIDIR --  IBILI --   PT/INR No results found for this basename: INR,  PROTIME   BNP  2414.   RADIOLOGY STUDIES: Dg Chest Port 1 View 04/01/2012  *RADIOLOGY REPORT*  Clinical Data: Line placement  PORTABLE CHEST - 1 VIEW  Comparison: 03/31/2012  Findings: Right IJ catheter placement in the interval with tip projecting over the proximal SVC.  No pneumothorax.  Cardiomegaly. Central vascular congestion.  Mild interstitial and airspace opacities. There may be trace effusions.  Multilevel degenerative changes.  IMPRESSION: Right IJ catheter placed with tip projecting over the proximal SVC. No pneumothorax.  Cardiomegaly with central vascular congestion and mild edema pattern, similar to prior.   Original Report Authenticated By: Carlos Levering, M.D.    Dg Chest Port 1 View 03/31/2012  *RADIOLOGY REPORT*  Clinical Data: Shortness of breath.  PORTABLE CHEST - 1 VIEW  Comparison: None.  Findings: There is marked enlargement of the cardiopericardial silhouette with vascular congestion.  No consolidative process, pneumothorax or effusion is identified.  Study is limited by the patient's body habitus and portable technique.  IMPRESSION: Marked enlargement of the cardiopericardial silhouette compatible with cardiomegaly and/or pericardial effusion.  Pulmonary vascular congestion without frank edema.   Original Report Authenticated By: Orlean Patten, M.D.     ENDOSCOPIC  STUDIES: None ever  IMPRESSION: *  Microcytic anemia in pt with chronic bleeding per rectum. Rule out cancer, rule out hemorrhoids Hgb improved post 4 units PRBCs but needs at least one more unit.  *  Lack of medical care, pt's choice *   Hysterectomy in 1980s for painful, not bleeding, fibroid tumors.  *  Hypoalbuminemia, several months anorexia without weight loss *  Cardiomegaly.  Suspect demand induced CHF.   PLAN: *  Colonoscopy and EGD tomorrow, 10 AM.   Pt agreeable.  Movi  prep tonite *  Get CBC tonite, along with PT/INR.  TSH is pending.  *  Will order one more unit red cells    LOS: 1 day   Azucena Freed  04/01/2012, 11:42 AM Pager: [phone]

## 2012-04-02 DIAGNOSIS — R609 Edema, unspecified: Secondary | ICD-10-CM

## 2012-04-02 DIAGNOSIS — R195 Other fecal abnormalities: Secondary | ICD-10-CM

## 2012-04-02 DIAGNOSIS — R0989 Other specified symptoms and signs involving the circulatory and respiratory systems: Secondary | ICD-10-CM

## 2012-04-02 DIAGNOSIS — D500 Iron deficiency anemia secondary to blood loss (chronic): Secondary | ICD-10-CM

## 2012-04-02 LAB — TYPE AND SCREEN
Antibody Screen: NEGATIVE
Unit division: 0
Unit division: 0
Unit division: 0
Unit division: 0

## 2012-04-02 LAB — CBC
HCT: 31.3 % — ABNORMAL LOW (ref 36.0–46.0)
Hemoglobin: 9.8 g/dL — ABNORMAL LOW (ref 12.0–15.0)
WBC: 17.3 10*3/uL — ABNORMAL HIGH (ref 4.0–10.5)

## 2012-04-02 LAB — GLUCOSE, CAPILLARY: Glucose-Capillary: 83 mg/dL (ref 70–99)

## 2012-04-02 MED ORDER — SODIUM CHLORIDE 0.9 % IJ SOLN
10.0000 mL | Freq: Two times a day (BID) | INTRAMUSCULAR | Status: DC
  Administered 2012-04-03 – 2012-04-05 (×2): 10 mL via INTRAVENOUS

## 2012-04-02 MED ORDER — POLYETHYLENE GLYCOL 3350 17 GM/SCOOP PO POWD
1.0000 | Freq: Once | ORAL | Status: AC
  Administered 2012-04-02: 1 via ORAL
  Filled 2012-04-02: qty 255

## 2012-04-02 MED ORDER — BISACODYL 5 MG PO TBEC
10.0000 mg | DELAYED_RELEASE_TABLET | Freq: Three times a day (TID) | ORAL | Status: AC
  Administered 2012-04-02 (×3): 10 mg via ORAL
  Filled 2012-04-02 (×3): qty 2
  Filled 2012-04-02: qty 1

## 2012-04-02 NOTE — Progress Notes (Signed)
Patient seen, examined, and I agree with the above documentation, including the assessment and plan. Change to a different colonoscopy prep and plan EGD colonoscopy tomorrow for evaluation of profound microcytic anemia

## 2012-04-02 NOTE — Progress Notes (Signed)
Patient c/o of feeling full, abdominal pain and cramping. Patient not able to drink all of the movi-prep at this time. Daughter requested an alternative if possible. Dr. Hilarie Fredrickson notified and aware. At this time Dr. Hilarie Fredrickson prefers that patient  May take small sips and if possible may need to delay procedure if unable to finish prep. Daughter and patient made aware of Dr. Vena Rua decision. Patient to try and drink movi prep at this time. Given orders for reglan if needed

## 2012-04-02 NOTE — Progress Notes (Signed)
[facility] Progress Note [name] TEAM 1 - Stepdown/ICU TEAM   Shannon Terry [id number] DOB: December 19, 1940 DOA: 03/31/2012 PCP: Pcp Not In System  Brief narrative: 72 year old female patient endorsed chronic fatigue and weakness since October 2013 as well as chronic intermittent bleeding per rectum. This has been associated with rectal pain and a sensation of bulging. She presented to the hospital because of profound weakness and fatigue. In the emergency department her hemoglobin was 2.9. Fecal occult blood was positive. Patient has also had recurrent headaches for which she takes aspirin regularly. She was otherwise hemodynamically stable at presentation. She has also had increasing lower extremity edema over several months with shortness of breath on exertion without associated chest pain. She was admitted to step down.  Assessment/Plan:  Microcytic severe Iron deficiency anemia due to suspected chronic blood loss -Hgb up to 9.3 after 6 units PRBC's -  -Iron <10- will need IV iron later this admission after transfusions complete -TSH 6.106- check free T4 and T3  GI bleed/Fecal occult blood test positive/?  Hemorrhoids -GI following -Endoscopy rescheduled for 1/29 due to incomplete bowel prep (pt diff complying 2/2 abd. Cramps/pain) -Differential: hemorrhoids vs malignancy  Diminished left dorsalis pedis pulse/? PVD -Noted on exam-no apparent claudication but will check ABI/arterial duplex  Bilateral lower extremity edema -Likely 2/2 profound chronic anemia w/ assoc low intravascular oncotic pressure -No indication for lower extremity venous duplex at this time as sx can be explained by anemia  Acute renal failure -Baseline Creat unknown but GFR has increased after transfusion so likley renal dysfunction acute and 2/2 to anemia/low perfusion -Repeat lytes in AM  Nonspecific abnormal electrocardiogram / unspecified ventricular conduction delay -Check ECHO today  Healthcare  screening/monitoring -By choice pt does not have or follow w/ PCP - has NOT had mammogram EVER but has undergone remote hysterectomy for fibroids - likewise, has had no colon CA screening   DVT prophylaxis: SCDs Code Status: Full Family Communication: Patient and husband Disposition Plan: Floor  Consultants: Gastroenterology  Procedures: Insertion of central venous catheter 04/01/2012 by PCCM  Antibiotics: None  HPI/Subjective: Patient alert without complaints. Husband in room. Frustrated because has made repeated attempts to get pt to seek medical care.   Objective: Blood pressure 147/68, pulse 78, temperature 98.3 F (36.8 C), temperature source Oral, resp. rate 14, height 5\' 4"  (1.626 m), weight 108.6 kg (239 lb 6.7 oz), SpO2 100.00%.  Intake/Output Summary (Last 24 hours) at 04/02/12 [id number] Last data filed at 04/02/12 0800  Gross per 24 hour  Intake   1487 ml  Output   2225 ml  Net   -738 ml     Exam: Gen: In no resp distress Lungs: CTA bilaterally, 2 L oxygen, no tachypnea Heart: S1 S2, no rubs, murmur, thrills or gallup, 1-2+ bilateral lower extremity edema, left DP pulse weak about 1-, no JVD Abd: obese, soft, non tender and non distended, BS present Musc-Skel: Symmetrical without cyanosis or clubbing Neuro; Alert and oriented x 3, MOE x 4, exam non focal, CN 2-12 grossly intact   Data Reviewed: Basic Metabolic Panel:  Lab 0000000 0500 03/31/12 2039  NA 143 140  K 3.8 4.1  CL 110 105  CO2 27 24  GLUCOSE 89 108*  BUN 13 14  CREATININE 0.89 0.96  CALCIUM 8.0* 8.2*  MG -- --  PHOS -- --   Liver Function Tests:  Lab 03/31/12 2039  AST 25  ALT 16  ALKPHOS 120*  BILITOT 0.4  PROT 6.1  ALBUMIN 2.8*   CBC:  Lab 04/01/12 2117 04/01/12 0500 03/31/12 2039  WBC 13.9* 8.9 7.2  NEUTROABS -- -- 3.9  HGB 9.3* 6.6* 2.9*  HCT 29.5* 22.2* 12.5*  MCV 69.2* 64.9* 52.3*  PLT 420* PLATELET CLUMPS NOTED ON SMEAR, COUNT APPEARS ADEQUATE 422*    Basename  03/31/12 2039  PROBNP 2414.0*   CBG:  Lab 04/02/12 0817 04/01/12 1601 04/01/12 1259 04/01/12 0740 04/01/12 0104  GLUCAP 83 101* 86 85 80    Recent Results (from the past 240 hour(s))  MRSA PCR SCREENING     Status: Normal   Collection Time   04/01/12  1:00 AM      Component Value Range Status Comment   MRSA by PCR NEGATIVE  NEGATIVE Final   URINE CULTURE     Status: Normal   Collection Time   04/01/12  1:00 AM      Component Value Range Status Comment   Specimen Description URINE, CATHETERIZED   Final    Special Requests NONE   Final    Culture  Setup Time 04/01/2012 01:40   Final    Colony Count NO GROWTH   Final    Culture NO GROWTH   Final    Report Status 04/01/2012 FINAL   Final      Studies:  Recent x-ray studies have been reviewed in detail by the Attending Physician  Scheduled Meds:  Reviewed in detail by the Attending Physician   Erin Hearing, ANP [facility] Office  [phone] Pager [phone]  On-Call/Text Page:      [email or web address]      password [email or web address]  If 7PM-7AM, please contact night-coverage [email or web address] Password [email or web address] 04/02/2012, 9:22 AM   LOS: 2 days   I have examined the patient, reviewed the chart and modified the above note which I agree with.   [id number] 04/02/2012, 3:41 PM

## 2012-04-02 NOTE — Progress Notes (Signed)
     Shannon Terry Daily Rounding Note 04/02/2012, 8:29 AM  SUBJECTIVE:       Did not even drink 1/2 of the first liter of movi prep.  Refused the remainder due to abdominal bloating, discomfort. No vomiting.  Only one small brown stool, no blood.  OBJECTIVE:         Vital signs in last 24 hours:    Temp:  [97.5 F (36.4 C)-98.4 F (36.9 C)] 97.8 F (36.6 C) (01/28 0400) Pulse Rate:  [62-92] 92  (01/28 0600) Resp:  [3-24] 11  (01/28 0600) BP: (123-161)/(54-82) 152/70 mmHg (01/28 0600) SpO2:  [99 %-100 %] 100 % (01/28 0600) Last BM Date: 04/01/12 General: obese, comfortable   Heart: RRR Chest: no resp distress Abdomen: obese, NT, BS active  Extremities: no pitting edema Neuro/Psych:  Cooperative, not confused.   Intake/Output from previous day: 01/27 0701 - 01/28 0700 In: 1467 [I.V.:770; Blood:695; IV Piggyback:2] Out: 2665 [Urine:2665]  Intake/Output this shift:    Lab Results:  Basename 04/01/12 2117 04/01/12 0500 03/31/12 2039  WBC 13.9* 8.9 7.2  HGB 9.3* 6.6* 2.9*  HCT 29.5* 22.2* 12.5*  PLT 420* PLATELET CLUMPS NOTED ON SMEAR, COUNT APPEARS ADEQUATE 422*   BMET  Basename 04/01/12 0500 03/31/12 2039  NA 143 140  K 3.8 4.1  CL 110 105  CO2 27 24  GLUCOSE 89 108*  BUN 13 14  CREATININE 0.89 0.96  CALCIUM 8.0* 8.2*   LFT  Basename 03/31/12 2039  PROT 6.1  ALBUMIN 2.8*  AST 25  ALT 16  ALKPHOS 120*  BILITOT 0.4  BILIDIR --  IBILI --   PT/INR  Basename 04/01/12 2155  LABPROT 17.4*  INR 1.47    Studies/Results: Dg Chest Port 1 View 04/01/2012  *RADIOLOGY REPORT*  Clinical Data: Line placement  PORTABLE CHEST - 1 VIEW  Comparison: 03/31/2012  Findings: Right IJ catheter placement in the interval with tip projecting over the proximal SVC.  No pneumothorax.  Cardiomegaly. Central vascular congestion.  Mild interstitial and airspace opacities. There may be trace effusions.  Multilevel degenerative changes.  IMPRESSION: Right IJ catheter placed with tip  projecting over the proximal SVC. No pneumothorax.  Cardiomegaly with central vascular congestion and mild edema pattern, similar to prior.   Original Report Authenticated By: Carlos Levering, M.D.     ASSESMENT: * Microcytic anemia in pt with chronic bleeding per rectum.  hgb improved post 6 units RBCs.  Rule out cancer, rule out hemorrhoids  Hgb improved post 4 units PRBCs but needs at least one more unit.  *  Difficulty with bowel prep.   *  Elevated PT.  * Lack of medical care, pt's choice  * Hypoalbuminemia, several months anorexia without weight loss  * Cardiomegaly. Suspect demand induced CHF.     PLAN: *  Attempt re prep with Dulcolax every 8 hours, Miralax/Gatorade. *  Procedures for 10 PM tomorrow.  *  CBC in AM    LOS: 2 days   Azucena Freed  04/02/2012, 8:29 AM Pager: [phone]

## 2012-04-02 NOTE — Progress Notes (Signed)
Patient refusing to drink movi-prep. Patient aware that the procedure maybe delayed until prep is done. VSS, patient is alert and oriented. Dr. Hilarie Fredrickson notified and aware. Will continue to monitor patient

## 2012-04-02 NOTE — Progress Notes (Signed)
Addendum to progress note below:  Patient has a L hematoma d/t EJ infiltrate 04/01/12. Patient denies any pain, tender to touch. Feels soft. There is still some bruising from patients neck that radiates to L shoulder. Will continue to monitor

## 2012-04-02 NOTE — Progress Notes (Signed)
Patient has a R hematoma d/t EJ infiltrate 04/01/12. Patient denies any pain, tender to touch. Feels soft. There is still some bruising from patients neck that radiates to R shoulder. Will continue to monitor

## 2012-04-02 NOTE — Progress Notes (Signed)
  Echocardiogram 2D Echocardiogram has been performed.  Shannon Terry 04/02/2012, 11:43 AM

## 2012-04-02 NOTE — Progress Notes (Signed)
VASCULAR LAB PRELIMINARY  ARTERIAL  ABI completed:  ABIs and pedal waveforms within normal limits.    RIGHT    LEFT    PRESSURE WAVEFORM  PRESSURE WAVEFORM  BRACHIAL 150 Triphasic  BRACHIAL 140 Triphasic   DP 164 Triphasic  DP 176 Biphasic   AT   AT    PT 178 Triphasic Triphasic  PT 177   PER   PER    GREAT TOE  NA GREAT TOE  NA    RIGHT LEFT  ABI 1.19 1.18     Shannon Terry, RVT 04/02/2012, 12:54 PM

## 2012-04-03 ENCOUNTER — Encounter (HOSPITAL_COMMUNITY): Payer: Self-pay | Admitting: *Deleted

## 2012-04-03 ENCOUNTER — Inpatient Hospital Stay (HOSPITAL_COMMUNITY): Payer: Medicare Other

## 2012-04-03 ENCOUNTER — Encounter (HOSPITAL_COMMUNITY)
Admission: EM | Disposition: A | Discharge status: Home Health | Payer: Self-pay | Source: Home / Self Care | Attending: Internal Medicine

## 2012-04-03 DIAGNOSIS — C20 Malignant neoplasm of rectum: Secondary | ICD-10-CM

## 2012-04-03 DIAGNOSIS — K2990 Gastroduodenitis, unspecified, without bleeding: Secondary | ICD-10-CM

## 2012-04-03 DIAGNOSIS — K2970 Gastritis, unspecified, without bleeding: Secondary | ICD-10-CM

## 2012-04-03 HISTORY — PX: COLONOSCOPY: SHX5424

## 2012-04-03 HISTORY — PX: ESOPHAGOGASTRODUODENOSCOPY: SHX5428

## 2012-04-03 HISTORY — PX: BIOPSY STOMACH: PRO33

## 2012-04-03 HISTORY — DX: Malignant neoplasm of rectum: C20

## 2012-04-03 HISTORY — PX: RECTAL BIOPSY: SHX2303

## 2012-04-03 LAB — CBC
HCT: 32.2 % — ABNORMAL LOW (ref 36.0–46.0)
Hemoglobin: 9.8 g/dL — ABNORMAL LOW (ref 12.0–15.0)
MCH: 21.4 pg — ABNORMAL LOW (ref 26.0–34.0)
MCHC: 30.4 g/dL (ref 30.0–36.0)

## 2012-04-03 LAB — COMPREHENSIVE METABOLIC PANEL
ALT: 12 U/L (ref 0–35)
Alkaline Phosphatase: 100 U/L (ref 39–117)
CO2: 30 mEq/L (ref 19–32)
Calcium: 8 mg/dL — ABNORMAL LOW (ref 8.4–10.5)
Chloride: 108 mEq/L (ref 96–112)
GFR calc Af Amer: >90 mL/min (ref >90)
GFR calc non Af Amer: 88 mL/min — ABNORMAL LOW (ref >90)
Glucose, Bld: 102 mg/dL — ABNORMAL HIGH (ref 70–99)
Sodium: 143 mEq/L (ref 135–145)
Total Bilirubin: 0.6 mg/dL (ref 0.3–1.2)

## 2012-04-03 SURGERY — EGD (ESOPHAGOGASTRODUODENOSCOPY)
Anesthesia: Moderate Sedation

## 2012-04-03 MED ORDER — SODIUM CHLORIDE 0.9 % IV SOLN
1020.0000 mg | Freq: Once | INTRAVENOUS | Status: AC
  Administered 2012-04-03: 1020 mg via INTRAVENOUS
  Filled 2012-04-03: qty 34

## 2012-04-03 MED ORDER — IOHEXOL 300 MG/ML  SOLN
60.0000 mL | Freq: Once | INTRAMUSCULAR | Status: AC | PRN
  Administered 2012-04-03: 60 mL via INTRAVENOUS

## 2012-04-03 MED ORDER — MIDAZOLAM HCL 10 MG/2ML IJ SOLN
INTRAMUSCULAR | Status: DC | PRN
  Administered 2012-04-03: 1 mg via INTRAVENOUS
  Administered 2012-04-03 (×2): 2 mg via INTRAVENOUS
  Administered 2012-04-03: 1 mg via INTRAVENOUS

## 2012-04-03 MED ORDER — MIDAZOLAM HCL 5 MG/ML IJ SOLN
INTRAMUSCULAR | Status: AC
  Filled 2012-04-03: qty 2

## 2012-04-03 MED ORDER — POTASSIUM CHLORIDE 10 MEQ/100ML IV SOLN
10.0000 meq | INTRAVENOUS | Status: AC
  Administered 2012-04-03 (×3): 10 meq via INTRAVENOUS
  Filled 2012-04-03 (×3): qty 100

## 2012-04-03 MED ORDER — BUTAMBEN-TETRACAINE-BENZOCAINE 2-2-14 % EX AERO
INHALATION_SPRAY | CUTANEOUS | Status: DC | PRN
  Administered 2012-04-03: 2 via TOPICAL

## 2012-04-03 MED ORDER — SODIUM CHLORIDE 0.9 % IJ SOLN
10.0000 mL | INTRAMUSCULAR | Status: DC | PRN
  Administered 2012-04-03: 20 mL
  Administered 2012-04-03 – 2012-04-05 (×4): 10 mL

## 2012-04-03 MED ORDER — IOHEXOL 300 MG/ML  SOLN
20.0000 mL | INTRAMUSCULAR | Status: AC
  Administered 2012-04-03: 25 mL via ORAL
  Administered 2012-04-03: 15:00:00 via ORAL

## 2012-04-03 MED ORDER — FENTANYL CITRATE 0.05 MG/ML IJ SOLN
INTRAMUSCULAR | Status: AC
  Filled 2012-04-03: qty 2

## 2012-04-03 MED ORDER — FENTANYL CITRATE 0.05 MG/ML IJ SOLN
INTRAMUSCULAR | Status: DC | PRN
  Administered 2012-04-03: 15 ug via INTRAVENOUS
  Administered 2012-04-03: 10 ug via INTRAVENOUS
  Administered 2012-04-03: 15 ug via INTRAVENOUS
  Administered 2012-04-03: 25 ug via INTRAVENOUS

## 2012-04-03 NOTE — Evaluation (Signed)
Physical Therapy Evaluation Patient Details Name: Shannon Terry MRN: [id number] DOB: 1940/09/26 Today's Date: 04/03/2012 Time: [id number] PT Time Calculation (min): 37 min  PT Assessment / Plan / Recommendation Clinical Impression  72 y.o. 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  She presents today with generalized weakness, deconditioning, decreased balance and decreased activity tolerance as demonstrated by increased DOE 2/4 with gait.  I was unable to get a good O2 sat reading while she was walking on RA.  Family is very supportive and avaialbe 24 hours at discharge.  Family's prefernece and my recommendation is for HHPT f/u.      PT Assessment  Patient needs continued PT services    Follow Up Recommendations  Home health PT;Supervision/Assistance - 24 hour    Does the patient have the potential to tolerate intense rehabilitation    NA- pt/family prefer home  Barriers to Discharge None      Equipment Recommendations  None recommended by PT    Recommendations for Other Services   none  Frequency Min 3X/week    Precautions / Restrictions Precautions Precautions: Fall   Pertinent Vitals/Pain Reports rectal pain, but did not rate, repositioned pt and she was able to let out some gas with gait which helped with the discomfort.  O2 sats on RA EOB 92%, increased DOE with gait from 0/4 to 2/4, but unable to get a good pulse ox reading while walking. 2 L O2 [place] returned to nose after gait due to DOE.  Will re attempt monitoring O2 with mobility next session.       Mobility  Bed Mobility Bed Mobility: Supine to Sit;Sitting - Scoot to [employer] of Bed;Sit to Supine;Scooting to [facility] Supine to Sit: 4: Min assist;With rails;HOB elevated Sitting - Scoot to Edge of Bed: 4: Min assist;With rail Sit to Supine: 3: Mod assist;With rail;HOB flat Scooting to HOB: 4: Min assist;With rail;Other (comment) (bed in  trendelenberg) Details for Bed Mobility Assistance: Pt relying heavily on railing to get to EOB.  Therapist supporting trunk during transition.  Use of bed pad to assist pt with reciprocal unweighting of hips to scoot.  Scoot to [facility] with cues for bridging.   Transfers Transfers: Sit to Stand;Stand to Sit Sit to Stand: 4: Min assist;From elevated surface;With upper extremity assist;With armrests;From bed Stand to Sit: 4: Min assist;With upper extremity assist;With armrests;To bed Details for Transfer Assistance: min assist from elevated surfaces to support trunk over weak legs.   Ambulation/Gait Ambulation/Gait Assistance: 4: Min assist Ambulation Distance (Feet): 65 Feet Assistive device: Rolling walker Ambulation/Gait Assistance Details: min assist to steady pt for balance.  At first started with short, choppy shuffle steps and once up and more confidenet on her feet she was able to take larger steps with RW.  Gait Pattern: Step-through pattern;Shuffle;Decreased stride length Gait velocity: less than 1.8 ft/sec which indicates risk for recurrent falls.  General Gait Details: increased DOE with gait to 2/4 from 0/4 at rest.  [facility] without O2, but unable to get good O 2 sat reading.  Will try again next session.  O2 re-applied to nose after session 2L- [place]       Exercises General Exercises - Lower Extremity Ankle Circles/Pumps: AROM;Both;20 reps;Supine Quad Sets: AROM;Both;10 reps;Supine Heel Slides: AROM;AAROM;Both;10 reps;Supine;Other (comment) (therapist resisted extension "leg press") Hip ABduction/ADduction: AROM;Both;10 reps;Supine Straight Leg Raises: AROM;Both;10 reps;Supine   PT Diagnosis: Difficulty walking;Abnormality of gait;Generalized weakness;Acute pain  PT Problem List: Decreased strength;Decreased activity tolerance;Decreased balance;Decreased mobility;Decreased knowledge of use of DME;Obesity;Pain PT Treatment Interventions: Gait training;Stair training;Functional mobility  training;Therapeutic activities;Therapeutic exercise;Balance training;Neuromuscular re-education;Patient/family education   PT Goals Acute Rehab PT Goals PT Goal Formulation: With patient Time For Goal Achievement: 04/17/12 Potential to Achieve Goals: Good Pt will go Supine/Side to Sit: with supervision PT Goal: Supine/Side to Sit - Progress: Goal set today Pt will go Sit to Stand: with supervision PT Goal: Sit to Stand - Progress: Goal set today Pt will go Stand to Sit: with supervision PT Goal: Stand to Sit - Progress: Goal set today Pt will Ambulate: 51 - 150 feet;with supervision;with rolling walker PT Goal: Ambulate - Progress: Goal set today Pt will Go Up / Down Stairs: 1-2 stairs;with min assist;with least restrictive assistive device PT Goal: Up/Down Stairs - Progress: Goal set today  Visit Information  Last PT Received On: 04/03/12 Assistance Needed: +1    Subjective Data  Subjective: Pt reports that she has not been out of the house since she started having to use the walker 3-4 weeks ago.   Patient Stated Goal: to return home   Prior [place] Lives With: Spouse Available Help at Discharge: Family;Available 24 hours/day (has very supportive daughter, granddaughter and husband.  ) Type of Home: House Home Access: Stairs to enter Technical brewer of Steps: 2 Entrance Stairs-Rails: None Home Layout: One level Home Adaptive Equipment: Walker - rolling Prior Function Level of Independence: Independent with assistive device(s) (until just prior to coming to the hospital) Able to Take Stairs?: Yes Driving: No Communication Communication: No difficulties Dominant Hand: Right    Cognition  Overall Cognitive Status: Appears within functional limits for tasks assessed/performed Arousal/Alertness: Awake/alert    Extremity/Trunk Assessment Right Lower Extremity Assessment RLE ROM/Strength/Tone: Deficits RLE ROM/Strength/Tone Deficits: grossly 4/5  per MMT EOB.  Right hip stronger than left hip flexion Left Lower Extremity Assessment LLE ROM/Strength/Tone: Deficits LLE ROM/Strength/Tone Deficits: grossly 4/5 throughout except left hip flexion 3-/5- needing to lean trunk back to lift hip and knee against gravity.     Balance Static Sitting Balance Static Sitting - Balance Support: Bilateral upper extremity supported;Feet supported Static Sitting - Level of Assistance: 5: Stand by assistance Static Sitting - Comment/# of Minutes: sat EOB 3-5 mins while preparing to walk, checking o2 sats, etc.    End of Session PT - End of Session Equipment Utilized During Treatment: Oxygen Activity Tolerance: Patient limited by fatigue Patient left: in bed;with call bell/phone within reach;with family/visitor present (daughter and granddaughter assisting with tx)       Wells Guiles B. [place], [place], DPT [phone]   04/03/2012, 2:23 PM

## 2012-04-03 NOTE — Progress Notes (Signed)
[facility] PROGRESS NOTE  Shannon Terry [id number] DOB: 1940-05-17 DOA: 03/31/2012 PCP: Pcp Not In System  Assessment/Plan:  Microcytic severe Iron deficiency anemia due to suspected chronic blood loss  Received 6 units. Hgb now 9.6.  Received IV iron (feraheme 1020 mg) today.  Transfuse as necessary   GI bleed/Fecal occult blood test positive- likely secondary to rectal cancer On colonoscopy 1/29 circumferential, partially obstructing, friable, fungating rectal mass.  Most likely rectal cancer. Mild antral gastropathy noted on upper endoscopy Awaiting biopsy results. CT abdomen/pelvis and chest ordered to eval for mets. Based on CT and biopsy results will call onc / surgery.  Hypokalemia - Secondary to GI loss from colonoscopy preparation - Replete and recheck in a.m.  Diminished left dorsalis pedis pulse/? PVD  Noted on exam-no apparent claudication.  ABI within normal limits.  Bilateral lower extremity edema  Likely 2/2 profound chronic anemia w/ assoc low intravascular oncotic pressure  No indication for lower extremity venous duplex at this time as sx can be explained by anemia   Acute renal failure  Resolved.  Creatinine now normal. renal dysfunction acute and 2/2 to anemia/low perfusion  Repeat lytes in AM   Nonspecific abnormal electrocardiogram / unspecified ventricular conduction delay  Mild pulmonary hypertension and grade 1 diastolic dysfunction on echo.  Healthcare screening/monitoring  Does not have a PCP.   Code Status: full Family Communication:  Disposition Plan: inpatient.  Awaiting biopsy results.   Consultants:  Gastroenterology  Procedures: 2D Study Conclusions  - Left ventricle: The cavity size was normal. Systolic function was normal. The estimated ejection fraction was in the range of 55% to 60%. Wall motion was normal; there were no regional wall motion abnormalities. There was an increased relative contribution of atrial  contraction to ventricular filling. Doppler parameters are consistent with abnormal left ventricular relaxation (grade 1 diastolic dysfunction). - Mitral valve: Mild regurgitation. - Left atrium: The atrium was mildly dilated. - Atrial septum: No defect or patent foramen ovale was identified. - Pulmonary arteries: PA peak pressure: 44mm Hg (S).  Impressions:  - The right ventricular systolic pressure was increased consistent with mild pulmonary hypertension.  Antibiotics:    HPI/Subjective: No complaints.  Completed bowel prep.  Stools were brown.    Objective: Filed Vitals:   04/03/12 1125 04/03/12 1140 04/03/12 1150 04/03/12 1326  BP: 148/84 148/72 161/73   Pulse:      Temp:      TempSrc:      Resp: 22 17 20    Height:      Weight:      SpO2: 92% 96% 99% 93%    Intake/Output Summary (Last 24 hours) at 04/03/12 1508 Last data filed at 04/03/12 0525  Gross per 24 hour  Intake   1010 ml  Output    352 ml  Net    658 ml   Filed Weights   04/01/12 0130 04/01/12 0800 04/03/12 0210  Weight: 108.6 kg (239 lb 6.7 oz) 108.6 kg (239 lb 6.7 oz) 108.047 kg (238 lb 3.2 oz)    Exam:   General:  Awake and alert, comfortable in bed.  Awaiting colonoscopy today  Cardiovascular: rrr, no m/r/g  Respiratory: cta no w/c/r  Abdomen: soft nt, nd, +BS, no masses.  Data Reviewed: Basic Metabolic Panel:  Lab [id number] 04/01/12 0500 03/31/12 2039  NA 143 143 140  K 2.8* 3.8 4.1  CL 108 110 105  CO2 30 27 24   GLUCOSE 102* 89 108*  BUN 6 13  14  CREATININE 0.67 0.89 0.96  CALCIUM 8.0* 8.0* 8.2*  MG -- -- --  PHOS -- -- --   Liver Function Tests:  Lab 04/03/12 0305 03/31/12 2039  AST 17 25  ALT 12 16  ALKPHOS 100 120*  BILITOT 0.6 0.4  PROT 5.3* 6.1  ALBUMIN 2.2* 2.8*   CBC:  Lab 04/03/12 0305 04/02/12 0820 04/01/12 2117 04/01/12 0500 03/31/12 2039  WBC 14.9* 17.3* 13.9* 8.9 7.2  NEUTROABS -- -- -- -- 3.9  HGB 9.8* 9.8* 9.3* 6.6* 2.9*  HCT 32.2* 31.3* 29.5*  22.2* 12.5*  MCV 70.2* 69.7* 69.2* 64.9* 52.3*  PLT 431* 438* 420* PLATELET CLUMPS NOTED ON SMEAR, COUNT APPEARS ADEQUATE 422*   BNP (last 3 results)  Basename 03/31/12 2039  PROBNP 2414.0*   CBG:  Lab 04/02/12 0817 04/01/12 1601 04/01/12 1259 04/01/12 0740 04/01/12 0104  GLUCAP 83 101* 86 85 80    Recent Results (from the past 240 hour(s))  MRSA PCR SCREENING     Status: Normal   Collection Time   04/01/12  1:00 AM      Component Value Range Status Comment   MRSA by PCR NEGATIVE  NEGATIVE Final   URINE CULTURE     Status: Normal   Collection Time   04/01/12  1:00 AM      Component Value Range Status Comment   Specimen Description URINE, CATHETERIZED   Final    Special Requests NONE   Final    Culture  Setup Time 04/01/2012 01:40   Final    Colony Count NO GROWTH   Final    Culture NO GROWTH   Final    Report Status 04/01/2012 FINAL   Final      Studies: No results found.  Scheduled Meds:   . bisacodyl  10 mg Oral TID  . hyoscyamine  0.25 mg Sublingual Once  . oxyCODONE-acetaminophen  1 tablet Oral Once  . pantoprazole  40 mg Oral Q0600  . sodium chloride  10 mL Intravenous Q12H   Continuous Infusions:   . sodium chloride 20 mL/hr at 04/02/12 2221    Principal Problem:  *Iron deficiency anemia due to chronic blood loss Active Problems:  GI bleed  Bilateral lower extremity edema  Hemorrhoids  Nonspecific abnormal electrocardiogram (ECG) (EKG)/unspecified ventricular conduction delay  Fecal occult blood test positive  Acute renal failure  Rectal cancer  Gastritis without bleeding    Time spent: 40 min.    Melton Alar  [facility] Pager [phone]. If 8PM-8AM, please contact night-coverage at [email or web address], password [facility] 04/03/2012, 3:08 PM  LOS: 3 days     Attending Patient seen and examined, in view of the above assessment and plan. Colonoscopy reveals a large partially obstructing rectal mass-biopsies pending. CT of the abdomen and  chest ordered for staging. Will likely need surgery and oncology consultation depending on her CT results  S Ghimire

## 2012-04-03 NOTE — Op Note (Signed)
[facility] [place], [address]   ENDOSCOPY PROCEDURE REPORT  PATIENT: Shannon Terry, Shannon Terry  MR#: [id number] BIRTHDATE: [birth year] , 19  yrs. old GENDER: Female ENDOSCOPIST: Jerene Bears, MD REFERRED BY:  [facility] PROCEDURE DATE:  04/03/2012 PROCEDURE:  EGD w/ biopsy for H.pylori ASA CLASS:     Class III INDICATIONS:  Unexplained iron deficiency anemia. MEDICATIONS: Fentanyl-Quick Pick, Fentanyl-Detailed 65 mcg IV, and Versed 6 mg IV   (combined procedure) TOPICAL ANESTHETIC: Cetacaine Spray  DESCRIPTION OF PROCEDURE: After the risks benefits and alternatives of the procedure were thoroughly explained, informed consent was obtained.  The [id number] endoscope was introduced through the mouth and advanced to the second portion of the duodenum. Without limitations.  The instrument was slowly withdrawn as the mucosa was fully examined.    ESOPHAGUS: The mucosa of the esophagus appeared normal.  STOMACH: There was mild antral gastropathy noted.  Cold forcep biopsies were taken at the antrum and angularis.   The stomach otherwise appeared normal.  DUODENUM: The duodenal mucosa showed no abnormalities in the bulb and second portion of the duodenum.  Retroflexed views revealed no abnormalities.     The scope was then withdrawn from the patient and the procedure completed.  COMPLICATIONS: There were no complications. ENDOSCOPIC IMPRESSION: 1.   The mucosa of the esophagus appeared normal 2.   There was mild antral gastropathy noted; biopsies obtained 3.   The stomach otherwise appeared normal 4.   The duodenal mucosa showed no abnormalities in the bulb and second portion of the duodenum  RECOMMENDATIONS: 1.  Await pathology results 2.  Follow-up of helicobacter pylori status, treat if indicated 3.  Proceed with a Colonoscopy.   eSigned:  Jerene Bears, MD 04/03/2012 11:27 AM CC:The Patient

## 2012-04-03 NOTE — Op Note (Signed)
[facility] [place], [address]   COLONOSCOPY PROCEDURE REPORT  PATIENT: Shannon Terry, Shannon Terry  MR#: [id number] BIRTHDATE: 1940-03-14 , 39  yrs. old GENDER: Female ENDOSCOPIST: Jerene Bears, MD REFERRED BY: [facility] PROCEDURE DATE:  04/03/2012 PROCEDURE:   Colonoscopy with biopsy ASA CLASS:   Class III INDICATIONS:Iron Deficiency Anemia and Rectal Bleeding. MEDICATIONS: Fentanyl-Detailed 65 mg IV and Versed 6 mg IV  DESCRIPTION OF PROCEDURE:   After the risks benefits and alternatives of the procedure were thoroughly explained, informed consent was obtained.  A digital rectal exam revealed a palpable rectal mass.   The adult upper endoscope was introduced through the anus (the standard adult colonoscope could not pass the rectal tumor) and advanced to the cecum, which was identified by both the appendix and ileocecal valve. No adverse events experienced.   The quality of the prep was Miralax fair  The instrument was then slowly withdrawn as the colon was fully examined.   COLON FINDINGS: A more than 2/3 circumferential, fungating and partially obstructing malignant tumor/mass, measuring 10cm in length, was seen in the rectum.  The tumor was friability with contact bleeding.  The tumor extends from very near the dentate line to approximately 12 cm.  Multiple biopsies of the lesion were performed using cold forceps.   Mild diverticulosis was noted in the descending colon and sigmoid colon.  Retroflexion was not performed due to rectal tumor.      The scope was withdrawn and the procedure completed.  COMPLICATIONS: There were no complications.  ENDOSCOPIC IMPRESSION: 1.   Malignant tumor in the rectum with contact bleeding; partial obstructing; multiple biopsies of the lesion were performed 2.   Mild diverticulosis was noted in the descending colon and sigmoid colon   RECOMMENDATIONS: 1.  Await pathology results 2.  CT scan chest,  abdomen, and pelvis. 3.  Surgical and oncologic consultations   eSigned:  Jerene Bears, MD 04/03/2012 11:37 AM   cc: The Patient   PATIENT NAME:  Shannon Terry, Shannon Terry MR#: [id number]

## 2012-04-03 NOTE — Interval H&P Note (Signed)
History and Physical Interval Note: Pt able to complete the preparation.   The nature of the procedures, as well as the risks, benefits, and alternatives were carefully and thoroughly reviewed with the patient. Ample time for discussion and questions allowed. The patient understood, was satisfied, and agreed to proceed.     04/03/2012 10:34 AM  Shannon Terry  has presented today for surgery, with the diagnosis of anemia.  blood per rectum  The various methods of treatment have been discussed with the patient and family. After consideration of risks, benefits and other options for treatment, the patient has consented to  Procedure(s) (LRB) with comments: ESOPHAGOGASTRODUODENOSCOPY (EGD) (N/A) COLONOSCOPY (N/A) as a surgical intervention .  The patient's history has been reviewed, patient examined, no change in status, stable for surgery.  I have reviewed the patient's chart and labs.  Questions were answered to the patient's satisfaction.     Tracer Gutridge M

## 2012-04-03 NOTE — Progress Notes (Signed)
Shannon Terry(daughter) needs FMLA paperwork filled out by Dr. Wynelle Cleveland on 1/29. Family member will be present at all times in the room and will have the paperwork. She is a Airline pilot at [employer]. She is requesting a PT consult to evaluate her mother's progressive weakness over a long period of time.

## 2012-04-04 ENCOUNTER — Encounter (HOSPITAL_COMMUNITY): Payer: Self-pay | Admitting: Internal Medicine

## 2012-04-04 DIAGNOSIS — D490 Neoplasm of unspecified behavior of digestive system: Secondary | ICD-10-CM

## 2012-04-04 LAB — CBC
Hemoglobin: 10.1 g/dL — ABNORMAL LOW (ref 12.0–15.0)
Platelets: 382 10*3/uL (ref 150–400)
RBC: 4.67 MIL/uL (ref 3.87–5.11)
WBC: 15.6 10*3/uL — ABNORMAL HIGH (ref 4.0–10.5)

## 2012-04-04 LAB — BASIC METABOLIC PANEL
Calcium: 8.1 mg/dL — ABNORMAL LOW (ref 8.4–10.5)
GFR calc Af Amer: >90 mL/min (ref >90)
GFR calc non Af Amer: 86 mL/min — ABNORMAL LOW (ref >90)
Potassium: 3.1 mEq/L — ABNORMAL LOW (ref 3.5–5.1)
Sodium: 143 mEq/L (ref 135–145)

## 2012-04-04 LAB — CEA: CEA: 1.8 ng/mL (ref 0.0–5.0)

## 2012-04-04 MED ORDER — ENSURE PUDDING PO PUDG
1.0000 | Freq: Three times a day (TID) | ORAL | Status: DC
  Administered 2012-04-05 – 2012-04-06 (×2): 1 via ORAL

## 2012-04-04 MED ORDER — FUROSEMIDE 40 MG PO TABS
40.0000 mg | ORAL_TABLET | Freq: Every day | ORAL | Status: DC
  Administered 2012-04-05: 40 mg via ORAL
  Filled 2012-04-04: qty 1

## 2012-04-04 MED ORDER — FUROSEMIDE 40 MG PO TABS
40.0000 mg | ORAL_TABLET | Freq: Every day | ORAL | Status: DC
  Filled 2012-04-04: qty 1

## 2012-04-04 MED ORDER — FUROSEMIDE 10 MG/ML IJ SOLN
40.0000 mg | Freq: Once | INTRAMUSCULAR | Status: AC
  Administered 2012-04-04: 40 mg via INTRAVENOUS
  Filled 2012-04-04: qty 4

## 2012-04-04 MED ORDER — POLYSACCHARIDE IRON COMPLEX 150 MG PO CAPS
150.0000 mg | ORAL_CAPSULE | Freq: Every day | ORAL | Status: DC
  Administered 2012-04-04 – 2012-04-06 (×3): 150 mg via ORAL
  Filled 2012-04-04 (×3): qty 1

## 2012-04-04 MED ORDER — POTASSIUM CHLORIDE CRYS ER 20 MEQ PO TBCR
40.0000 meq | EXTENDED_RELEASE_TABLET | Freq: Two times a day (BID) | ORAL | Status: AC
  Administered 2012-04-04 (×2): 40 meq via ORAL
  Filled 2012-04-04 (×2): qty 2

## 2012-04-04 NOTE — Care Management Note (Signed)
    Page 1 of 2   04/05/2012     12:33:16 PM   CARE MANAGEMENT NOTE 04/05/2012  Patient:  Shannon Terry, Shannon Terry   Account Number:  [id number]  Date Initiated:  04/04/2012  Documentation initiated by:  Tomi Bamberger  Subjective/Objective Assessment:   dx iron def anemia, rectal ca     Action/Plan:   pt eval- rec hhpt   Anticipated DC Date:  04/05/2012   Anticipated DC Plan:  [place]  CM consult      [facility] Choice  HOME HEALTH   Choice offered to / List presented to:  C-4 Adult Children   DME arranged  3-N-1      DME agency  [place] arranged  [facility]      [place].   Status of service:  Completed, signed off Medicare Important Message given?   (If response is "NO", the following Medicare IM given date fields will be blank) Date Medicare IM given:   Date Additional Medicare IM given:    Discharge Disposition:  [place]  Per UR Regulation:  Reviewed for med. necessity/level of care/duration of stay  If discussed at [place] of Stay Meetings, dates discussed:    Comments:  04/05/12 12:29 Tomi Bamberger RN, BSN [phone] patient is for dc today, she will go home with HHPT and HHRN to draw CBC on Friday and send results to Dr. Jorje Guild (oncologist)  and dr. Dorthy Cooler (PCP),  patient does not have med part D but daughter states they will be able to get her medications for her and she has transportation.  BSC has been brought up to her room.  Patient's daughter states they will go to social services or the security office to apply for Medicare part D.  HHRN will draw the CBC and send this to the MD  and after that patient should be established with a PCP where they can draw her CBC.  04/04/12 16:34 Tomi Bamberger RN, BSN [phone] patient lives with spouse, I spoke with patient's daughter who works here, Lelon Frohlich [phone], she states she would like to work with [facility], she  states she will ask mom if it is ok for her to have a [facility] for medication management and she will let me know tomorrow.  Referral made to [facility] for HHPT , Butch Penny notified.  Lelon Frohlich states that she will also need a BSC.

## 2012-04-04 NOTE — Progress Notes (Signed)
Pt's daughter is requesting that the pt gets a new foley put in. The patient's foley was discontinued earlier today. Pt is voiding just fine. Pt has no indications for a foley catheter. Pt was given lasix iv at 1306. Pt is frequently voiding. Pt voided 50cc. Bladder scan was done that showed 200 cc. RN already paged NP on call Schorr who stated that she wouldn't give the pt a foley because it's not indicated. Pt daughter's is requesting a foley so that the pt could sleep. NP Schorr who stated that she would give the pt an order for something to help her sleep but would not give an order for a foley that isn't indicated. RN will page NP Schorr back again. Pt's daughter Lelon Frohlich stated "If the doctor isn't going to give an order for my mom to get a foley, i'm going to need for her to come up here and explain to me why she isn't going to get it." RN will page NP on call and await further orders. -Larose Kells, RN

## 2012-04-04 NOTE — Progress Notes (Signed)
[facility] Daily Rounding Note 04/04/2012, 8:46 AM  SUBJECTIVE:       Some rectal bleeding overnight.  No nausea, no abdominal pain, no dizziness.  Slept well.   OBJECTIVE:         Vital signs in last 24 hours:    Temp:  [98 F (36.7 C)-98.8 F (37.1 C)] 98.8 F (37.1 C) (01/30 0625) Pulse Rate:  [80-86] 82  (01/30 0625) Resp:  [16-30] 20  (01/30 0625) BP: (123-162)/(64-94) 142/72 mmHg (01/30 0625) SpO2:  [89 %-99 %] 96 % (01/30 0625) Last BM Date: 04/03/12 General: looks well   Heart: RRR Chest: clear Abdomen: soft, NT, ND, positive BS  Extremities: no CCE.    Neuro/Psych:  Relaxed, not confused.   Intake/Output from previous day: 01/29 0701 - 01/30 0700 In: 759.3 [I.V.:459.3] Out: 450 [Urine:450]  Intake/Output this shift:    Lab Results:  Basename 04/03/12 0305 04/02/12 0820 04/01/12 2117  WBC 14.9* 17.3* 13.9*  HGB 9.8* 9.8* 9.3*  HCT 32.2* 31.3* 29.5*  PLT 431* 438* 420*   BMET  Basename 04/03/12 0305  NA 143  K 2.8*  CL 108  CO2 30  GLUCOSE 102*  BUN 6  CREATININE 0.67  CALCIUM 8.0*    Studies/Results: 04/03/2012   CT CHEST  Findings:  Diffuse prominent subcutaneous edema noted with moderate bilateral pleural effusions and passive atelectasis.  Moderate cardiomegaly is present.  No pericardial effusion.  Small bilateral axillary lymph nodes are not pathologically enlarged by size criteria.  No pathologic hilar or mediastinal adenopathy.  No definite enhancement along the pleural surfaces to specifically indicate an exudative effusion.  In the upper chest, the subcutaneous edema is especially concentrated anteriorly and along the breasts, whereas in the lower chest and abdomen the extensive subcutaneous edema is circumferential.  Aside from the passive atelectasis, the lungs appear relatively clear.  Despite efforts by the patient and technologist, motion artifact is present on some series of today's examination and could not be totally eliminated.  This  reduces diagnostic sensitivity and specificity.  Technical factors related to patient body habitus also reduce diagnostic sensitivity and specificity.  Due to the large body habitus and dilutional effects of the relatively small contrast volume, it is difficult to observe the interventricular septum to tell if there is flattening or contour reversal to indicate elevated right heart pressures.  IMPRESSION:  1.  Cardiomegaly, with moderate bilateral pleural effusions and associated passive atelectasis, but without overt pulmonary edema noted. 2.  Extensive subcutaneous edema, especially down in the abdomen and lower chest.  In the upper chest, the subcutaneous edema is particularly concentrated anteriorly along the breasts.  The appearance suggests third spacing of fluid possibly from elevated right heart pressures or more likely from the patient's known hypoproteinemia and hypoalbuminemia. 3.  We do not demonstrate compelling evidence of metastatic disease to the chest.   CT ABDOMEN AND PELVIS  Findings:  The liver, spleen, pancreas, and adrenal glands appear unremarkable.  The gallbladder appears contracted. The kidneys appear unremarkable, as do the proximal ureters.  A Foley catheter is present in the otherwise of the urinary bladder.  There is low-level mesenteric edema, but significantly less than the degree of subcutaneous edema present diffusely in the abdomen pelvis, although particularly anteriorly in the lower pelvis and thigh region.  The distal pancreatic node appears to measure 1.4 cm on image 58 of series 2.  Small mesenteric lymph nodes are present along with small retroperitoneal lymph nodes  which are not pathologically enlarged by size criteria.  Similarly left external iliac lymph nodes and right external iliac nodes are not pathologically enlarged by size criteria.  No definite nodularity of the omentum or liver margins.  I do not observe a discrete mesenteric mass.  A large mixed density in the  expected vicinity of the rectum measures 8.4 x 7.7 cm and appears to displace luminal contrast medium to the right posterior margin.  I expect that this represents the patient's rectal tumor.  Landmarks are obscured but involvement of the anus is quite likely.  There is some lobularity of this mass along the left perirectal region.  No definite nodularity or invasion in the ischiorectal fossa.  No abnormally enlarged perirectal lymph nodes are present.  I suspect that the fatty appearance along the right obturator canal is due to fatty atrophy of the adductor musculature or although a lipoma could have a similar appearance.  There are bilateral chronic pars defects at L5 but with only about 2 mm of anterolisthesis at L5-S1.  Uterus is absent.  Sigmoid diverticulosis noted.  IMPRESSION:  1.  Large rectal mass may extend to or mildly beyond the anus.  The only enlarged lymph node the abdomen and pelvis is in the peripancreatic region, and could possibly be reactive. 2.  No compelling regions of metastatic disease identified. 3.  Extensive subcutaneous edema in the abdomen and pelvis, possibly due to the patient's hypoproteinemia and hypoalbuminemia. 4.  Sigmoid diverticulosis. 5.  Bilateral pars defects at L5.   Original Report Authenticated By: Van Clines, M.D.     ASSESMENT: *  Rectal cancer. Presenting with chronic rectal bleeding and  Symptomatic Hgb of 2.9. Colonoscopy with Bx 1/29.  Awaiting pathology.  CT scan with no strong evidence of mets *  Chronic blood loss anemia.  S/p multiple PRBCs. Hgb stable. *  Cardiomegaly and  right heart failure. By CT and echo.    PLAN: *    The path on rectal mass not back yet, but this is certainly a rectal cancer. Oncology consult called, surgery PA has seen pt. *  GI will sign off.  Call for reinvolvement.  We are available PRN.   *  CBCs can be followed by oncology.  She may require future transfusions if rectal bleeding continues.     LOS: 4 days    Azucena Freed  04/04/2012, 8:46 AM Pager: [phone]

## 2012-04-04 NOTE — Progress Notes (Signed)
INITIAL NUTRITION ASSESSMENT  DOCUMENTATION CODES Per approved criteria  -Non-severe (moderate) malnutrition in the context of social or environmental circumstances   INTERVENTION: 1. Ensure Pudding po TID, each supplement provides 170 kcal and 4 grams of protein. As ordered by PA 2. RD will continue to follow   NUTRITION DIAGNOSIS: Malnutrition related to decreased appetite as evidenced by unable to meet nutrition needs and fluid accumulation.   Goal: PO intake to meet >/=90% estimated nutrition needs  Monitor:  PO intake, supplement tolerance, weight trends, I/O's  Reason for Assessment: Consult-diet education   72 y.o. female  Admitting Dx: Iron deficiency anemia due to chronic blood loss   ASSESSMENT: Pt admitted with increased weakness, found to have rectal mass likely rectal CA. Still awaiting bx results. Admitting hb was 2.9, required 6 units of blood.   RD was consulted for diet education, pt with low albumin (2.2) and low protein (5.3). Pt with severe anasarca per notes. RN documented 2+ edema in BLE, and generalized edema.   Spoke with pt who states that she has been eating poorly for some time, eating less then 1/2 of her meals. Foods were not appealing anymore. Unsure if she has had any weight loss, does not know what her usual weight is. If she has had weight loss, could be masked by fluids accumulation.   Pt meets criteria for moderate malnutrition in the context of chronic illness 2/2 meeting <75% estimated nutrition needs for >/= 1 month and fluid accumulation.   Pt is somewhat willing to try Ensure. Does not really want to eat or drink anything right now as she reports she is using the bathroom often. RD encouraged high protein intake and try the Ensure.   Height: Ht Readings from Last 1 Encounters:  04/03/12 5\' 4"  (1.626 m)    Weight: Wt Readings from Last 1 Encounters:  04/03/12 238 lb 3.2 oz (108.047 kg)    Ideal Body Weight: 120 lbs   % Ideal  Body Weight: 198%  Wt Readings from Last 10 Encounters:  04/03/12 238 lb 3.2 oz (108.047 kg)  04/03/12 238 lb 3.2 oz (108.047 kg)    Usual Body Weight: unknown  % Usual Body Weight: --  BMI:  Body mass index is 40.89 kg/(m^2). Obesity class 3  Estimated Nutritional Needs: Kcal: 2000-2200 Protein: 90-100 gm  Fluid: 2-2.2 L/day  Skin: intact   Diet Order: General Only one meal documented at 50% completion   EDUCATION NEEDS: -Education needs addressed   Intake/Output Summary (Last 24 hours) at 04/04/12 1443 Last data filed at 04/04/12 0900  Gross per 24 hour  Intake 1119.33 ml  Output    600 ml  Net 519.33 ml    Last BM: 1/30- reports diarrhea started today and has been using the bathroom several times an hour.    Labs:   Lab 04/04/12 0918 04/03/12 0305 04/01/12 0500  NA 143 143 143  K 3.1* 2.8* 3.8  CL 107 108 110  CO2 27 30 27   BUN 6 6 13   CREATININE 0.71 0.67 0.89  CALCIUM 8.1* 8.0* 8.0*  MG -- -- --  PHOS -- -- --  GLUCOSE 93 102* 89    CBG (last 3)   Basename 04/02/12 0817 04/01/12 1601  GLUCAP 83 101*    Scheduled Meds:   . feeding supplement  1 Container Oral TID BM  . furosemide  40 mg Oral Daily  . hyoscyamine  0.25 mg Sublingual Once  . iron polysaccharides  150  mg Oral Daily  . oxyCODONE-acetaminophen  1 tablet Oral Once  . pantoprazole  40 mg Oral Q0600  . potassium chloride  40 mEq Oral BID  . sodium chloride  10 mL Intravenous Q12H    Continuous Infusions:   Past Medical History  Diagnosis Date  . Obesity     BMI 42 Jan 2014.   Marland Kitchen Anemia   . GERD (gastroesophageal reflux disease)     Past Surgical History  Procedure Date  . Abdominal hysterectomy   . Esophagogastroduodenoscopy 04/03/2012    Procedure: ESOPHAGOGASTRODUODENOSCOPY (EGD);  Surgeon: Jerene Bears, MD;  Location: [place];  Service: Gastroenterology;  Laterality: N/A;  . Colonoscopy 04/03/2012    Procedure: COLONOSCOPY;  Surgeon: Jerene Bears, MD;  Location:  [place];  Service: Gastroenterology;  Laterality: N/A;    Orson Slick RD, LDN Pager [phone] After Hours pager [phone]

## 2012-04-04 NOTE — Consult Note (Signed)
Reason for Consult: Rectal mass Referring Physician: Dr. Verlon Setting is an 72 y.o. female.  HPI: 72 yr old female who was admitted to [facility] from the ER on 04/01/12 due to severe weakness and profound anemia.  Her hgb on admission was 2.9.  She received 6 units of PRBC.  She was hemodynamically stable and her anemia seemed to be due to chronic losses.  GI was consulted due to positive guaiac results.  They performed endoscopies.  Colonoscopy showed a large, circumfrential rectal mass that extended to the anus.  This was noted to be very long, friable, and very low.  Biopsies were taken.  We are asked to see the patient by Dr. Sloan Leiter to evaluate need for surgical intervention.  The patient relates pain with bowel movements but no pain otherwise.  She has been having regular bowel movements.  She denies nausea or vomiting.  She has had a hysterectomy in the past but no other abdominal surgeries.  Past Medical History  Diagnosis Date  . Obesity     BMI 42 Jan 2014.   Marland Kitchen Anemia   . GERD (gastroesophageal reflux disease)     Past Surgical History  Procedure Date  . Abdominal hysterectomy   . Esophagogastroduodenoscopy 04/03/2012    Procedure: ESOPHAGOGASTRODUODENOSCOPY (EGD);  Surgeon: Jerene Bears, MD;  Location: [place];  Service: Gastroenterology;  Laterality: N/A;  . Colonoscopy 04/03/2012    Procedure: COLONOSCOPY;  Surgeon: Jerene Bears, MD;  Location: [place];  Service: Gastroenterology;  Laterality: N/A;    Family History  Problem Relation Age of Onset  . Diabetes Mellitus II Father   . Leukemia Brother     Social History:  reports that she has never smoked. She does not have any smokeless tobacco history on file. She reports that she does not drink alcohol or use illicit drugs.  Allergies: No Known Allergies  Medications: I have reviewed the patient's current medications.  Results for orders placed during the hospital encounter of 03/31/12 (from the past 48 hour(s))   CBC     Status: Abnormal   Collection Time   04/03/12  3:05 AM      Component Value Range Comment   WBC 14.9 (*) 4.0 - 10.5 K/uL WHITE COUNT CONFIRMED ON SMEAR   RBC 4.59  3.87 - 5.11 MIL/uL    Hemoglobin 9.8 (*) 12.0 - 15.0 g/dL    HCT 32.2 (*) 36.0 - 46.0 %    MCV 70.2 (*) 78.0 - 100.0 fL    MCH 21.4 (*) 26.0 - 34.0 pg    MCHC 30.4  30.0 - 36.0 g/dL    RDW NOT CALCULATED  11.5 - 15.5 %    Platelets 431 (*) 150 - 400 K/uL PLATELET COUNT CONFIRMED BY SMEAR  T3     Status: Abnormal   Collection Time   04/03/12  3:05 AM      Component Value Range Comment   T3, Total 44.3 (*) 80.0 - 204.0 ng/dl   COMPREHENSIVE METABOLIC PANEL     Status: Abnormal   Collection Time   04/03/12  3:05 AM      Component Value Range Comment   Sodium 143  135 - 145 mEq/L    Potassium 2.8 (*) 3.5 - 5.1 mEq/L DELTA CHECK NOTED   Chloride 108  96 - 112 mEq/L    CO2 30  19 - 32 mEq/L    Glucose, Bld 102 (*) 70 - 99 mg/dL    BUN  6  6 - 23 mg/dL    Creatinine, Ser 0.67  0.50 - 1.10 mg/dL    Calcium 8.0 (*) 8.4 - 10.5 mg/dL    Total Protein 5.3 (*) 6.0 - 8.3 g/dL    Albumin 2.2 (*) 3.5 - 5.2 g/dL    AST 17  0 - 37 U/L    ALT 12  0 - 35 U/L    Alkaline Phosphatase 100  39 - 117 U/L    Total Bilirubin 0.6  0.3 - 1.2 mg/dL    GFR calc non Af Amer 88 (*) >90 mL/min    GFR calc Af Amer >90  >90 mL/min   T4, FREE     Status: Normal   Collection Time   04/03/12  3:05 AM      Component Value Range Comment   Free T4 1.06  0.80 - 1.80 ng/dL     Ct Chest W Contrast  04/03/2012  *RADIOLOGY REPORT*  Clinical Data:  Rectal tumor, query metastatic disease.  Anemia.  CT CHEST, ABDOMEN AND PELVIS WITH CONTRAST  Technique:  Multidetector CT imaging of the chest, abdomen and pelvis was performed following the standard protocol during bolus administration of intravenous contrast.  Contrast: 28mL OMNIPAQUE IOHEXOL 300 MG/ML  SOLN  Comparison:  04/01/2012  CT CHEST  Findings:  Diffuse prominent subcutaneous edema noted with  moderate bilateral pleural effusions and passive atelectasis.  Moderate cardiomegaly is present.  No pericardial effusion.  Small bilateral axillary lymph nodes are not pathologically enlarged by size criteria.  No pathologic hilar or mediastinal adenopathy.  No definite enhancement along the pleural surfaces to specifically indicate an exudative effusion.  In the upper chest, the subcutaneous edema is especially concentrated anteriorly and along the breasts, whereas in the lower chest and abdomen the extensive subcutaneous edema is circumferential.  Aside from the passive atelectasis, the lungs appear relatively clear.  Despite efforts by the patient and technologist, motion artifact is present on some series of today's examination and could not be totally eliminated.  This reduces diagnostic sensitivity and specificity.  Technical factors related to patient body habitus also reduce diagnostic sensitivity and specificity.  Due to the large body habitus and dilutional effects of the relatively small contrast volume, it is difficult to observe the interventricular septum to tell if there is flattening or contour reversal to indicate elevated right heart pressures.  IMPRESSION:  1.  Cardiomegaly, with moderate bilateral pleural effusions and associated passive atelectasis, but without overt pulmonary edema noted. 2.  Extensive subcutaneous edema, especially down in the abdomen and lower chest.  In the upper chest, the subcutaneous edema is particularly concentrated anteriorly along the breasts.  The appearance suggests third spacing of fluid possibly from elevated right heart pressures or more likely from the patient's known hypoproteinemia and hypoalbuminemia. 3.  We do not demonstrate compelling evidence of metastatic disease to the chest.  CT ABDOMEN AND PELVIS  Findings:  The liver, spleen, pancreas, and adrenal glands appear unremarkable.  The gallbladder appears contracted. The kidneys appear unremarkable, as do  the proximal ureters.  A Foley catheter is present in the otherwise of the urinary bladder.  There is low-level mesenteric edema, but significantly less than the degree of subcutaneous edema present diffusely in the abdomen pelvis, although particularly anteriorly in the lower pelvis and thigh region.  The distal pancreatic node appears to measure 1.4 cm on image 58 of series 2.  Small mesenteric lymph nodes are present along with small retroperitoneal lymph nodes  which are not pathologically enlarged by size criteria.  Similarly left external iliac lymph nodes and right external iliac nodes are not pathologically enlarged by size criteria.  No definite nodularity of the omentum or liver margins.  I do not observe a discrete mesenteric mass.  A large mixed density in the expected vicinity of the rectum measures 8.4 x 7.7 cm and appears to displace luminal contrast medium to the right posterior margin.  I expect that this represents the patient's rectal tumor.  Landmarks are obscured but involvement of the anus is quite likely.  There is some lobularity of this mass along the left perirectal region.  No definite nodularity or invasion in the ischiorectal fossa.  No abnormally enlarged perirectal lymph nodes are present.  I suspect that the fatty appearance along the right obturator canal is due to fatty atrophy of the adductor musculature or although a lipoma could have a similar appearance.  There are bilateral chronic pars defects at L5 but with only about 2 mm of anterolisthesis at L5-S1.  Uterus is absent.  Sigmoid diverticulosis noted.  IMPRESSION:  1.  Large rectal mass may extend to or mildly beyond the anus.  The only enlarged lymph node the abdomen and pelvis is in the peripancreatic region, and could possibly be reactive. 2.  No compelling regions of metastatic disease identified. 3.  Extensive subcutaneous edema in the abdomen and pelvis, possibly due to the patient's hypoproteinemia and hypoalbuminemia.  4.  Sigmoid diverticulosis. 5.  Bilateral pars defects at L5.   Original Report Authenticated By: Van Clines, M.D.    Ct Abdomen Pelvis W Contrast  04/03/2012  *RADIOLOGY REPORT*  Clinical Data:  Rectal tumor, query metastatic disease.  Anemia.  CT CHEST, ABDOMEN AND PELVIS WITH CONTRAST  Technique:  Multidetector CT imaging of the chest, abdomen and pelvis was performed following the standard protocol during bolus administration of intravenous contrast.  Contrast: 9mL OMNIPAQUE IOHEXOL 300 MG/ML  SOLN  Comparison:  04/01/2012  CT CHEST  Findings:  Diffuse prominent subcutaneous edema noted with moderate bilateral pleural effusions and passive atelectasis.  Moderate cardiomegaly is present.  No pericardial effusion.  Small bilateral axillary lymph nodes are not pathologically enlarged by size criteria.  No pathologic hilar or mediastinal adenopathy.  No definite enhancement along the pleural surfaces to specifically indicate an exudative effusion.  In the upper chest, the subcutaneous edema is especially concentrated anteriorly and along the breasts, whereas in the lower chest and abdomen the extensive subcutaneous edema is circumferential.  Aside from the passive atelectasis, the lungs appear relatively clear.  Despite efforts by the patient and technologist, motion artifact is present on some series of today's examination and could not be totally eliminated.  This reduces diagnostic sensitivity and specificity.  Technical factors related to patient body habitus also reduce diagnostic sensitivity and specificity.  Due to the large body habitus and dilutional effects of the relatively small contrast volume, it is difficult to observe the interventricular septum to tell if there is flattening or contour reversal to indicate elevated right heart pressures.  IMPRESSION:  1.  Cardiomegaly, with moderate bilateral pleural effusions and associated passive atelectasis, but without overt pulmonary edema noted. 2.   Extensive subcutaneous edema, especially down in the abdomen and lower chest.  In the upper chest, the subcutaneous edema is particularly concentrated anteriorly along the breasts.  The appearance suggests third spacing of fluid possibly from elevated right heart pressures or more likely from the patient's known hypoproteinemia and hypoalbuminemia. 3.  We do not demonstrate compelling evidence of metastatic disease to the chest.  CT ABDOMEN AND PELVIS  Findings:  The liver, spleen, pancreas, and adrenal glands appear unremarkable.  The gallbladder appears contracted. The kidneys appear unremarkable, as do the proximal ureters.  A Foley catheter is present in the otherwise of the urinary bladder.  There is low-level mesenteric edema, but significantly less than the degree of subcutaneous edema present diffusely in the abdomen pelvis, although particularly anteriorly in the lower pelvis and thigh region.  The distal pancreatic node appears to measure 1.4 cm on image 58 of series 2.  Small mesenteric lymph nodes are present along with small retroperitoneal lymph nodes which are not pathologically enlarged by size criteria.  Similarly left external iliac lymph nodes and right external iliac nodes are not pathologically enlarged by size criteria.  No definite nodularity of the omentum or liver margins.  I do not observe a discrete mesenteric mass.  A large mixed density in the expected vicinity of the rectum measures 8.4 x 7.7 cm and appears to displace luminal contrast medium to the right posterior margin.  I expect that this represents the patient's rectal tumor.  Landmarks are obscured but involvement of the anus is quite likely.  There is some lobularity of this mass along the left perirectal region.  No definite nodularity or invasion in the ischiorectal fossa.  No abnormally enlarged perirectal lymph nodes are present.  I suspect that the fatty appearance along the right obturator canal is due to fatty atrophy of  the adductor musculature or although a lipoma could have a similar appearance.  There are bilateral chronic pars defects at L5 but with only about 2 mm of anterolisthesis at L5-S1.  Uterus is absent.  Sigmoid diverticulosis noted.  IMPRESSION:  1.  Large rectal mass may extend to or mildly beyond the anus.  The only enlarged lymph node the abdomen and pelvis is in the peripancreatic region, and could possibly be reactive. 2.  No compelling regions of metastatic disease identified. 3.  Extensive subcutaneous edema in the abdomen and pelvis, possibly due to the patient's hypoproteinemia and hypoalbuminemia. 4.  Sigmoid diverticulosis. 5.  Bilateral pars defects at L5.   Original Report Authenticated By: Van Clines, M.D.     Review of Systems  Constitutional: Positive for malaise/fatigue. Negative for chills and weight loss.  HENT: Negative for hearing loss and tinnitus.   Eyes: Negative.   Respiratory: Positive for shortness of breath (on exertion).   Cardiovascular: Positive for leg swelling. Negative for chest pain and palpitations.  Gastrointestinal: Positive for nausea, vomiting, abdominal pain and blood in stool. Negative for constipation.  Genitourinary: Negative.   Musculoskeletal: Negative.   Skin: Negative.   Neurological: Positive for dizziness, weakness and headaches.  Endo/Heme/Allergies: Negative.   Psychiatric/Behavioral: Negative.    Blood pressure 142/72, pulse 82, temperature 98.8 F (37.1 C), temperature source Oral, resp. rate 20, height 5\' 4"  (1.626 m), weight 238 lb 3.2 oz (108.047 kg), SpO2 96.00%. Physical Exam  Constitutional: She is oriented to person, place, and time. She appears well-developed and well-nourished. No distress.  HENT:  Head: Normocephalic and atraumatic.  Eyes: Conjunctivae normal are normal. Pupils are equal, round, and reactive to light.  Neck: Normal range of motion. Neck supple.  Cardiovascular: Normal rate and regular rhythm.    Respiratory: Breath sounds normal.       Mild decrease bilateral  GI: Soft. Bowel sounds are normal. She exhibits mass (just right of midline). There  is no tenderness.  Genitourinary: Guaiac positive stool (per report).       Did not perform rectal  Musculoskeletal: She exhibits edema (lower ext.).  Neurological: She is alert and oriented to person, place, and time.  Skin: Skin is warm and dry.  Psychiatric: She has a normal mood and affect. Her behavior is normal. Thought content normal.    Assessment/Plan: 1. Low circumferential rectal mass that is non obstructing: at this time there is no need for urgent surgical intervention.  The mass is very low and extends to the anus.  Would recommend oncology consult to consider chemo/radiation therapy to shrink the mass before considering surgical resection.  We will follow the patient on this admission and plan for follow up as outpatient.  2. Iron deficiency anemia due to chronic blood loss: s/p transfusion, and now on iron replacement.   WHITE, [name] 04/04/2012, 9:14 AM

## 2012-04-04 NOTE — Consult Note (Signed)
Large, very low, partially obstructing rectal cancer - on digital rectal examination, this mass extends to the dentate line Some oozing from biopsy Path results pending CT scan shows no sign of distant mets CEA level pending  Agree with Onc/ Rad Onc consults for neoadjuvant therapy.  May follow-up with surgery as an outpatient  Will follow with you.  Imogene Burn. Georgette Dover, MD, [facility] Surgery  04/04/2012 12:34 PM

## 2012-04-04 NOTE — Consult Note (Addendum)
[place]  [phone]) [phone]   HEMATOLOGY ONCOLOGY CONSULTATION   Shannon Terry  DOB: [birth year]  MR#: [id number]  CSN#: [id number]    Requesting Physician: [facility]  Dr. Sloan Leiter  Primary MD: None Reason For Consult: Rectal Cancer  History of present illness:    72 y.o. African American   female  Asked to see for evaluation of rectal carcinoma. In review, Shannon Terry had been experiencing intermittent bright red blood per rectum upon evacuation,  for about one year. She did not seek medical attention. Status complicated since October 2013, when she began to have increasing fatigue and rectal pain, accompanied by bloating. No weight loss but decreased  Appetite. She was brought by daughter to the ED. Exam revealed a palpable large rectal mass..Her Hb was 2.9/ Hct 22.2 requiring 6 Units of blood to date.MCV was 52. She was admitted on  1/26/ 2014 for further evaluation. Colonoscopy performed on 04/03/2012, Dr. Hilarie Fredrickson showed circumferential, partially obstructing, friable, fungating rectal mass, which was biopsied Mild antral gastropathy noted on upper endoscopy on the same day.   CT  of the abdomen and pelvis with contrast on1 /29/2013 showed  a distal pancreatic node appears to measure 1.4 cm, small mesenteric lymph nodes present along with small retroperitoneal lymph nodes which are not pathologically enlarged. No definite nodularity of the omentum or liver margins.No discrete mesenteric mass. A large rectal mass  measures 8.4 x 7.7 cm and appears to displace luminal contrast medium to the right posterior margin.Landmarks are obscured but involvement of the anus is quite likely. There is some lobularity of this mass along the left perirectal region. No definite nodularity or invasion in the ischiorectal fossa. No abnormally enlarged perirectal lymph nodes are present. CT of the chest on 04/03/12 with contrast show cardiomegaly and bilateral pleural effusions, subcutaneous edema in  the setting of elevated heart pressures during this admission, but no discrete mass. CEA is pending. No family history of colon cancer. Never had a  colonoscopy / EGD  rrior to this admission . Denies risk factors for HIV or hepatitis.  She does consume significant amount of red meat.  No Diabetes or Chron's disease.No Tobacco or  ETOH. Denied hanges in bowel caliber.Denies having been treated for  anemia . Never took oral iron.  We were kindly requested to see the patient with recommendations, to determine the role of neoadjuvant chemotherapy prior to likely resection of the mass.   Past medical history:      Past Medical History  Diagnosis Date  . Obesity     BMI 42 Jan 2014.   Marland Kitchen Anemia   . GERD (gastroesophageal reflux disease)     Past surgical history:      Past Surgical History  Procedure Date  . Abdominal hysterectomy   . Esophagogastroduodenoscopy 04/03/2012    Procedure: ESOPHAGOGASTRODUODENOSCOPY (EGD);  Surgeon: Jerene Bears, MD;  Location: [place];  Service: Gastroenterology;  Laterality: N/A;  . Colonoscopy 04/03/2012    Procedure: COLONOSCOPY;  Surgeon: Jerene Bears, MD;  Location: [place];  Service: Gastroenterology;  Laterality: N/A;    Medications:  Prior to Admission:  Prescriptions prior to admission  Medication Sig Dispense Refill  . aspirin 325 MG buffered tablet Take 325 mg by mouth daily.        [id number], acetaminophen, ondansetron (ZOFRAN) IV, ondansetron, sodium chloride  Allergies: No Known Allergies  Family history:     Family History  Problem Relation Age of Onset  .  Diabetes Mellitus II Father   . Leukemia Brother   Mother died of old age. One sister had pancreatic cancer. One brother died with MI. No family history of Colon Cancer.                                          Social history:   Married 3 Children in good health. . Lives in [place]. Retired Oncologist.  No Tobacco or ETOH history.   Review of systems:    See HPI for significant positives.  Constitutional: Positive Negative for weight loss. Negative for fever,night sweats, or chills  Eyes: Negative for blurred vision and double vision.  Respiratory: Negative for cough or  hemoptysis Positive for  shortness of breath in the setting of severe anemia.  Cardiovascular: Negative for chest pain. Negative for palpitations.  GI:  As per HPI. No nausea, vomiting, diarrhea, or constipation. [id number] for hematuria. No loss of urinary control. No Urinary retention.  Skin: Negative for itching. No rash. No petechia. No bruising.  Neurological: No headaches. No motor or sensory deficits.No confusion.     Physical exam:      Filed Vitals:   04/04/12 0625  BP: 142/72  Pulse: 82  Temp: 98.8 F (37.1 C)  Resp: 20     Body mass index is 40.89 kg/(m^2).   General: 72 y.o. female  in no acute distress A. and O. x3  well-developed and well-nourished.  HEENT: Normocephalic, atraumatic, PERRLA. Oral cavity without thrush or lesions. Neck supple. no thyromegaly, no cervical or supraclavicular adenopathy  Lungs clear bilaterally . No wheezing, rhonchi or rales. No axillary masses. Breasts: not examined. Cardiac regular rate and rhythm normal S1-S2, no murmur , rubs or gallops Abdomen  Obese, soft nontender , bowel sounds x4. No HSM. No masses palpable. Abdominal subcutaneous edema. GU/rectal: deferred. Extremities no clubbing, no  cyanosis or edema. No bruising or petechial rash Musculoskeletal: no spinal tenderness.  Neuro: Non Focal   Lab results:       CBC  Lab 04/04/12 0918 04/03/12 0305 04/02/12 0820 04/01/12 2117 04/01/12 0500 03/31/12 2039  WBC 15.6* 14.9* 17.3* 13.9* 8.9 --  HGB 10.1* 9.8* 9.8* 9.3* 6.6* --  HCT 33.8* 32.2* 31.3* 29.5* 22.2* --  PLT 382 431* 438* 420* PLATELET CLUMPS NOTED ON SMEAR, COUNT APPEARS ADEQUATE --  MCV 72.4* 70.2* 69.7* 69.2* 64.9* --  MCH 21.6* 21.4* 21.8* 21.8* 19.3* --  MCHC 29.9* 30.4 31.3 31.5 29.7*  --  RDW NOT CALCULATED NOT CALCULATED NOT CALCULATED NOT CALCULATED NOT CALCULATED --  LYMPHSABS -- -- -- -- -- 1.3  MONOABS -- -- -- -- -- 1.8*  EOSABS -- -- -- -- -- 0.1  BASOSABS -- -- -- -- -- 0.1  BANDABS -- -- -- -- -- --    Anemia panel:  No results found for this basename: VITAMINB12:2,FOLATE:2,FERRITIN:2,TIBC:2,IRON:2,RETICCTPCT:2 in the last 72 hours   Chemistries   Lab 04/04/12 0918 04/03/12 0305 04/01/12 0500 03/31/12 2039  NA 143 143 143 140  K 3.1* 2.8* 3.8 4.1  CL 107 108 110 105  CO2 27 30 27 24   GLUCOSE 93 102* 89 108*  BUN 6 6 13 14   CREATININE 0.71 0.67 0.89 0.96  CALCIUM 8.1* 8.0* 8.0* 8.2*  MG -- -- -- --     Coagulation profile  Lab 04/01/12 2155  INR 1.47  PROTIME --  Urine Studies No results found for this basename: UACOL:2,UAPR:2,USPG:2,UPH:2,UTP:2,UGL:2,UKET:2,UBIL:2,UHGB:2,UNIT:2,UROB:2,ULEU:2,UEPI:2,UWBC:2,URBC:2,UBAC:2,CAST:2,CRYS:2,UCOM:2,BILUA:2 in the last 72 hours  Studies:      Ct Chest W Contrast  04/03/2012  *RADIOLOGY REPORT*  Clinical Data:  Rectal tumor, query metastatic disease.  Anemia.  CT CHEST, ABDOMEN AND PELVIS WITH CONTRAST  Technique:  Multidetector CT imaging of the chest, abdomen and pelvis was performed following the standard protocol during bolus administration of intravenous contrast.  Contrast: 50mL OMNIPAQUE IOHEXOL 300 MG/ML  SOLN  Comparison:  04/01/2012  CT CHEST  Findings:  Diffuse prominent subcutaneous edema noted with moderate bilateral pleural effusions and passive atelectasis.  Moderate cardiomegaly is present.  No pericardial effusion.  Small bilateral axillary lymph nodes are not pathologically enlarged by size criteria.  No pathologic hilar or mediastinal adenopathy.  No definite enhancement along the pleural surfaces to specifically indicate an exudative effusion.  In the upper chest, the subcutaneous edema is especially concentrated anteriorly and along the breasts, whereas in the lower chest and abdomen  the extensive subcutaneous edema is circumferential.  Aside from the passive atelectasis, the lungs appear relatively clear.  Despite efforts by the patient and technologist, motion artifact is present on some series of today's examination and could not be totally eliminated.  This reduces diagnostic sensitivity and specificity.  Technical factors related to patient body habitus also reduce diagnostic sensitivity and specificity.  Due to the large body habitus and dilutional effects of the relatively small contrast volume, it is difficult to observe the interventricular septum to tell if there is flattening or contour reversal to indicate elevated right heart pressures.  IMPRESSION:  1.  Cardiomegaly, with moderate bilateral pleural effusions and associated passive atelectasis, but without overt pulmonary edema noted. 2.  Extensive subcutaneous edema, especially down in the abdomen and lower chest.  In the upper chest, the subcutaneous edema is particularly concentrated anteriorly along the breasts.  The appearance suggests third spacing of fluid possibly from elevated right heart pressures or more likely from the patient's known hypoproteinemia and hypoalbuminemia. 3.  We do not demonstrate compelling evidence of metastatic disease to the chest.  CT ABDOMEN AND PELVIS  Findings:  The liver, spleen, pancreas, and adrenal glands appear unremarkable.  The gallbladder appears contracted. The kidneys appear unremarkable, as do the proximal ureters.  A Foley catheter is present in the otherwise of the urinary bladder.  There is Terry-level mesenteric edema, but significantly less than the degree of subcutaneous edema present diffusely in the abdomen pelvis, although particularly anteriorly in the lower pelvis and thigh region.  The distal pancreatic node appears to measure 1.4 cm on image 58 of series 2.  Small mesenteric lymph nodes are present along with small retroperitoneal lymph nodes which are not pathologically  enlarged by size criteria.  Similarly left external iliac lymph nodes and right external iliac nodes are not pathologically enlarged by size criteria.  No definite nodularity of the omentum or liver margins.  I do not observe a discrete mesenteric mass.  A large mixed density in the expected vicinity of the rectum measures 8.4 x 7.7 cm and appears to displace luminal contrast medium to the right posterior margin.  I expect that this represents the patient's rectal tumor.  Landmarks are obscured but involvement of the anus is quite likely.  There is some lobularity of this mass along the left perirectal region.  No definite nodularity or invasion in the ischiorectal fossa.  No abnormally enlarged perirectal lymph nodes are present.  I suspect that the fatty  appearance along the right obturator canal is due to fatty atrophy of the adductor musculature or although a lipoma could have a similar appearance.  There are bilateral chronic pars defects at L5 but with only about 2 mm of anterolisthesis at L5-S1.  Uterus is absent.  Sigmoid diverticulosis noted.  IMPRESSION:  1.  Large rectal mass may extend to or mildly beyond the anus.  The only enlarged lymph node the abdomen and pelvis is in the peripancreatic region, and could possibly be reactive. 2.  No compelling regions of metastatic disease identified. 3.  Extensive subcutaneous edema in the abdomen and pelvis, possibly due to the patient's hypoproteinemia and hypoalbuminemia. 4.  Sigmoid diverticulosis. 5.  Bilateral pars defects at L5.   Original Report Authenticated By: Van Clines, M.D.    Ct Abdomen Pelvis W Contrast  04/03/2012  *RADIOLOGY REPORT*  Clinical Data:  Rectal tumor, query metastatic disease.  Anemia.  CT CHEST, ABDOMEN AND PELVIS WITH CONTRAST  Technique:  Multidetector CT imaging of the chest, abdomen and pelvis was performed following the standard protocol during bolus administration of intravenous contrast.  Contrast: 61mL OMNIPAQUE  IOHEXOL 300 MG/ML  SOLN  Comparison:  04/01/2012  CT CHEST  Findings:  Diffuse prominent subcutaneous edema noted with moderate bilateral pleural effusions and passive atelectasis.  Moderate cardiomegaly is present.  No pericardial effusion.  Small bilateral axillary lymph nodes are not pathologically enlarged by size criteria.  No pathologic hilar or mediastinal adenopathy.  No definite enhancement along the pleural surfaces to specifically indicate an exudative effusion.  In the upper chest, the subcutaneous edema is especially concentrated anteriorly and along the breasts, whereas in the lower chest and abdomen the extensive subcutaneous edema is circumferential.  Aside from the passive atelectasis, the lungs appear relatively clear.  Despite efforts by the patient and technologist, motion artifact is present on some series of today's examination and could not be totally eliminated.  This reduces diagnostic sensitivity and specificity.  Technical factors related to patient body habitus also reduce diagnostic sensitivity and specificity.  Due to the large body habitus and dilutional effects of the relatively small contrast volume, it is difficult to observe the interventricular septum to tell if there is flattening or contour reversal to indicate elevated right heart pressures.  IMPRESSION:  1.  Cardiomegaly, with moderate bilateral pleural effusions and associated passive atelectasis, but without overt pulmonary edema noted. 2.  Extensive subcutaneous edema, especially down in the abdomen and lower chest.  In the upper chest, the subcutaneous edema is particularly concentrated anteriorly along the breasts.  The appearance suggests third spacing of fluid possibly from elevated right heart pressures or more likely from the patient's known hypoproteinemia and hypoalbuminemia. 3.  We do not demonstrate compelling evidence of metastatic disease to the chest.  CT ABDOMEN AND PELVIS  Findings:  The liver, spleen,  pancreas, and adrenal glands appear unremarkable.  The gallbladder appears contracted. The kidneys appear unremarkable, as do the proximal ureters.  A Foley catheter is present in the otherwise of the urinary bladder.  There is Terry-level mesenteric edema, but significantly less than the degree of subcutaneous edema present diffusely in the abdomen pelvis, although particularly anteriorly in the lower pelvis and thigh region.  The distal pancreatic node appears to measure 1.4 cm on image 58 of series 2.  Small mesenteric lymph nodes are present along with small retroperitoneal lymph nodes which are not pathologically enlarged by size criteria.  Similarly left external iliac lymph nodes and right external  iliac nodes are not pathologically enlarged by size criteria.  No definite nodularity of the omentum or liver margins.  I do not observe a discrete mesenteric mass.  A large mixed density in the expected vicinity of the rectum measures 8.4 x 7.7 cm and appears to displace luminal contrast medium to the right posterior margin.  I expect that this represents the patient's rectal tumor.  Landmarks are obscured but involvement of the anus is quite likely.  There is some lobularity of this mass along the left perirectal region.  No definite nodularity or invasion in the ischiorectal fossa.  No abnormally enlarged perirectal lymph nodes are present.  I suspect that the fatty appearance along the right obturator canal is due to fatty atrophy of the adductor musculature or although a lipoma could have a similar appearance.  There are bilateral chronic pars defects at L5 but with only about 2 mm of anterolisthesis at L5-S1.  Uterus is absent.  Sigmoid diverticulosis noted.  IMPRESSION:  1.  Large rectal mass may extend to or mildly beyond the anus.  The only enlarged lymph node the abdomen and pelvis is in the peripancreatic region, and could possibly be reactive. 2.  No compelling regions of metastatic disease identified. 3.   Extensive subcutaneous edema in the abdomen and pelvis, possibly due to the patient's hypoproteinemia and hypoalbuminemia. 4.  Sigmoid diverticulosis. 5.  Bilateral pars defects at L5.   Original Report Authenticated By: Van Clines, M.D.    Dg Chest Port 1 View  04/01/2012  *RADIOLOGY REPORT*  Clinical Data: Line placement  PORTABLE CHEST - 1 VIEW  Comparison: 03/31/2012  Findings: Right IJ catheter placement in the interval with tip projecting over the proximal SVC.  No pneumothorax.  Cardiomegaly. Central vascular congestion.  Mild interstitial and airspace opacities. There may be trace effusions.  Multilevel degenerative changes.  IMPRESSION: Right IJ catheter placed with tip projecting over the proximal SVC. No pneumothorax.  Cardiomegaly with central vascular congestion and mild edema pattern, similar to prior.   Original Report Authenticated By: Carlos Levering, M.D.    Dg Chest Port 1 View  03/31/2012  *RADIOLOGY REPORT*  Clinical Data: Shortness of breath.  PORTABLE CHEST - 1 VIEW  Comparison: None.  Findings: There is marked enlargement of the cardiopericardial silhouette with vascular congestion.  No consolidative process, pneumothorax or effusion is identified.  Study is limited by the patient's body habitus and portable technique.  IMPRESSION: Marked enlargement of the cardiopericardial silhouette compatible with cardiomegaly and/or pericardial effusion.  Pulmonary vascular congestion without frank edema.   Original Report Authenticated By: Orlean Patten, M.D.     Assessmnent/Plan:71 y.o. female admitted  Via ED with symptomatic anemia with a Hb 2.9 requiring total 6 Units of blood, in the setting of a rectal mass palpated during initial exam, which per further testing with CT of the abdomen and pelvis was consistent with rectal carcinoma. CEA is pending. Patient underwent colonoscocopy on 1/29, which confirmed the rectal mass, from which biopsies were taken, pathology pending. We  were requested to see the patient  with recommendations for possible neoadjuvant chemotherapy  note, for her Fe deficiency she has received   IV iron (feraheme 1020 mg) 1/29 and Started on oral iron therapy (Nu-iron)   Dr.Ennever  is to see the patient following this consult with recommendations regarding diagnosis, treatment options and further workup studies.  An addendum to this note is to be written. Thank you for the referral.   [facility] 04/04/2012  ADDENDUM:  I agree with the above assessment by Clarise Cruz. Ms. Hulton likely has stage III rectal cancer. It is incredible that she came in with a hemoglobin of 2.9. She did get blood. She's got IV iron. She currently is on oral iron.  A staging studies to date did not show any evidence of metastatic disease.  Dr. Georgette Dover of surgery has seen her and is recommending neoadjuvant therapy which I agree with.  I feel that she would benefit from an endoscopic ultrasound. I would speak with Dr. Hilarie Fredrickson and see if he or one of his colleagues could do this.  She will need to be seen by radiation oncology. I would plan to treat her with oral Xeloda as a radiation sensitizer. I don't see that she will need any additional chemotherapy along with radiation.  Surprisingly enough, she's not had any obvious bowel problems. I would recommend a laxative and stool softener to make sure that she continues to go to the bathroom.  Her physical exam does not show anything that is focal. There is no palpable hepatospleno megaly. Her lungs are clear. Cardiac exam regular rate and rhythm. Extremities shows no clubbing cyanosis or edema.  She unfortunately does not have a family Dr. At some point, she will need one to help manage other health issues. Overall, it seems as if she was quite healthy before she got to the hospital. She had to be healthy them manage a hemoglobin of 2.9.  Her CEA is 1.8. This I think is indicative of localized disease. Her hemoglobin from  January 30 was 10.1.  Again, she will need neoadjuvant chemotherapy/radiation therapy. She will benefit from an endoscopic ultrasound of the rectum to get a better sense of the locally advanced nature of this malignancy.  I don't see a pathology report back yet. I have to imagine that this is adenocarcinoma.  She says that she will not have a colostomy. I told her that this might have to happen if she is to have the best chance of cure her. Her tumor is very Terry lying and even with a great response to treatment, the surgeon may have no choice but to do a colostomy. This, I think, might be  tough for Mrs.Klemmer from an emotional point of view. We will pray about this.  She is very nice. Her daughter was with her. I answered all their questions.  Waldemar Dickens 20:4

## 2012-04-04 NOTE — Progress Notes (Signed)
[facility] PROGRESS NOTE  Shannon Terry [id number] DOB: [birth year] DOA: 03/31/2012 PCP: Pcp Not In System  Assessment/Plan:  Microcytic severe Iron deficiency anemia due to chronic, slow, GI blood loss  Received 6 units. Hgb now 10.1  Received IV iron (feraheme 1020 mg) 1/29.  Started on oral iron therapy (Nu-iron, non-constipating)  GI bleed/Fecal occult blood test positive- likely secondary to rectal cancer On colonoscopy 1/29 circumferential, partially obstructing, friable, fungating rectal mass.  Most likely rectal cancer. Mild antral gastropathy noted on upper endoscopy Awaiting biopsy results. CT abdomen/pelvis and chest showed no mets, but did show anasarca in the abdomen. Surgery has evaluated the patient.  They recommend attempting to shrink the tumor prior to surgery in the hopes of saving a portion of the rectum and avoiding a permanent colostomy.   Oncology consulted.  Anasarca Extensive subcutaneous edema in the Chest, Ab, Pelvis evidenced on CT scans. Likely secondary to severe anemia and low protein (5.3), low albumin (2.2) Nutrition consultation.  Added Ensure. Will add lasix and monitor closely  Hypokalemia Secondary to GI loss  Replete and recheck in a.m.  Diminished left dorsalis pedis pulse/? PVD  Noted on exam-no apparent claudication.  ABI within normal limits.  Bilateral lower extremity edema  Likely 2/2 profound chronic anemia w/ assoc low intravascular oncotic pressure  No indication for lower extremity venous duplex at this time as sx can be explained by anemia   Acute renal failure  Resolved.  Creatinine now normal. renal dysfunction acute and 2/2 to anemia/low perfusion   Nonspecific abnormal electrocardiogram / unspecified ventricular conduction delay  Mild pulmonary hypertension and grade 1 diastolic dysfunction on echo.  Healthcare screening/monitoring  Does not have a PCP.   Code Status: full Family Communication:  Disposition  Plan: inpatient.  Awaiting biopsy results.   Consultants:  Gastroenterology  Surgery  Oncology  Procedures: 2D Study Conclusions  - Left ventricle: The cavity size was normal. Systolic function was normal. The estimated ejection fraction was in the range of 55% to 60%. Wall motion was normal; there were no regional wall motion abnormalities. There was an increased relative contribution of atrial contraction to ventricular filling. Doppler parameters are consistent with abnormal left ventricular relaxation (grade 1 diastolic dysfunction). - Mitral valve: Mild regurgitation. - Left atrium: The atrium was mildly dilated. - Atrial septum: No defect or patent foramen ovale was identified. - Pulmonary arteries: PA peak pressure: 18mm Hg (S).  Impressions:  - The right ventricular systolic pressure was increased consistent with mild pulmonary hypertension.  Antibiotics:    HPI/Subjective: Patient with no complaints.  Quiet.      Objective: Filed Vitals:   04/03/12 1326 04/03/12 1520 04/03/12 2159 04/04/12 0625  BP:  156/70 135/64 142/72  Pulse:  80 86 82  Temp:  98.7 F (37.1 C) 98 F (36.7 C) 98.8 F (37.1 C)  TempSrc:  Oral Oral Oral  Resp:  20 18 20   Height:      Weight:      SpO2: 93% 96% 96% 96%    Intake/Output Summary (Last 24 hours) at 04/04/12 1158 Last data filed at 04/04/12 0900  Gross per 24 hour  Intake 1119.33 ml  Output    600 ml  Net 519.33 ml   Filed Weights   04/01/12 0130 04/01/12 0800 04/03/12 0210  Weight: 108.6 kg (239 lb 6.7 oz) 108.6 kg (239 lb 6.7 oz) 108.047 kg (238 lb 3.2 oz)    Exam:   General:  Awake, appears sad, comfortable  in bed.  Cardiovascular: Normal S1, S2, no M/R/G, Minimal lower extremity edema.  Respiratory: No Wheeze, No Accessory muscle use  Abdomen: Obese, soft, non tender, + Bowel sounds, no masses  Data Reviewed: Basic Metabolic Panel:  Lab [id number] 04/03/12 0305 04/01/12 0500 03/31/12 2039  NA 143  143 143 140  K 3.1* 2.8* 3.8 4.1  CL 107 108 110 105  CO2 27 30 27 24   GLUCOSE 93 102* 89 108*  BUN 6 6 13 14   CREATININE 0.71 0.67 0.89 0.96  CALCIUM 8.1* 8.0* 8.0* 8.2*  MG -- -- -- --  PHOS -- -- -- --   Liver Function Tests:  Lab 04/03/12 0305 03/31/12 2039  AST 17 25  ALT 12 16  ALKPHOS 100 120*  BILITOT 0.6 0.4  PROT 5.3* 6.1  ALBUMIN 2.2* 2.8*   CBC:  Lab 04/04/12 0918 04/03/12 0305 04/02/12 0820 04/01/12 2117 04/01/12 0500 03/31/12 2039  WBC 15.6* 14.9* 17.3* 13.9* 8.9 --  NEUTROABS -- -- -- -- -- 3.9  HGB 10.1* 9.8* 9.8* 9.3* 6.6* --  HCT 33.8* 32.2* 31.3* 29.5* 22.2* --  MCV 72.4* 70.2* 69.7* 69.2* 64.9* --  PLT 382 431* 438* 420* PLATELET CLUMPS NOTED ON SMEAR, COUNT APPEARS ADEQUATE --   BNP (last 3 results)  Basename 03/31/12 2039  PROBNP 2414.0*   CBG:  Lab 04/02/12 0817 04/01/12 1601 04/01/12 1259 04/01/12 0740 04/01/12 0104  GLUCAP 83 101* 86 85 80    Recent Results (from the past 240 hour(s))  MRSA PCR SCREENING     Status: Normal   Collection Time   04/01/12  1:00 AM      Component Value Range Status Comment   MRSA by PCR NEGATIVE  NEGATIVE Final   URINE CULTURE     Status: Normal   Collection Time   04/01/12  1:00 AM      Component Value Range Status Comment   Specimen Description URINE, CATHETERIZED   Final    Special Requests NONE   Final    Culture  Setup Time 04/01/2012 01:40   Final    Colony Count NO GROWTH   Final    Culture NO GROWTH   Final    Report Status 04/01/2012 FINAL   Final      Studies: Ct Chest W Contrast  04/03/2012  *RADIOLOGY REPORT*  Clinical Data:  Rectal tumor, query metastatic disease.  Anemia.  CT CHEST, ABDOMEN AND PELVIS WITH CONTRAST  Technique:  Multidetector CT imaging of the chest, abdomen and pelvis was performed following the standard protocol during bolus administration of intravenous contrast.  Contrast: 79mL OMNIPAQUE IOHEXOL 300 MG/ML  SOLN  Comparison:  04/01/2012  CT CHEST  Findings:  Diffuse  prominent subcutaneous edema noted with moderate bilateral pleural effusions and passive atelectasis.  Moderate cardiomegaly is present.  No pericardial effusion.  Small bilateral axillary lymph nodes are not pathologically enlarged by size criteria.  No pathologic hilar or mediastinal adenopathy.  No definite enhancement along the pleural surfaces to specifically indicate an exudative effusion.  In the upper chest, the subcutaneous edema is especially concentrated anteriorly and along the breasts, whereas in the lower chest and abdomen the extensive subcutaneous edema is circumferential.  Aside from the passive atelectasis, the lungs appear relatively clear.  Despite efforts by the patient and technologist, motion artifact is present on some series of today's examination and could not be totally eliminated.  This reduces diagnostic sensitivity and specificity.  Technical factors related to patient  body habitus also reduce diagnostic sensitivity and specificity.  Due to the large body habitus and dilutional effects of the relatively small contrast volume, it is difficult to observe the interventricular septum to tell if there is flattening or contour reversal to indicate elevated right heart pressures.  IMPRESSION:  1.  Cardiomegaly, with moderate bilateral pleural effusions and associated passive atelectasis, but without overt pulmonary edema noted. 2.  Extensive subcutaneous edema, especially down in the abdomen and lower chest.  In the upper chest, the subcutaneous edema is particularly concentrated anteriorly along the breasts.  The appearance suggests third spacing of fluid possibly from elevated right heart pressures or more likely from the patient's known hypoproteinemia and hypoalbuminemia. 3.  We do not demonstrate compelling evidence of metastatic disease to the chest.  CT ABDOMEN AND PELVIS  Findings:  The liver, spleen, pancreas, and adrenal glands appear unremarkable.  The gallbladder appears  contracted. The kidneys appear unremarkable, as do the proximal ureters.  A Foley catheter is present in the otherwise of the urinary bladder.  There is low-level mesenteric edema, but significantly less than the degree of subcutaneous edema present diffusely in the abdomen pelvis, although particularly anteriorly in the lower pelvis and thigh region.  The distal pancreatic node appears to measure 1.4 cm on image 58 of series 2.  Small mesenteric lymph nodes are present along with small retroperitoneal lymph nodes which are not pathologically enlarged by size criteria.  Similarly left external iliac lymph nodes and right external iliac nodes are not pathologically enlarged by size criteria.  No definite nodularity of the omentum or liver margins.  I do not observe a discrete mesenteric mass.  A large mixed density in the expected vicinity of the rectum measures 8.4 x 7.7 cm and appears to displace luminal contrast medium to the right posterior margin.  I expect that this represents the patient's rectal tumor.  Landmarks are obscured but involvement of the anus is quite likely.  There is some lobularity of this mass along the left perirectal region.  No definite nodularity or invasion in the ischiorectal fossa.  No abnormally enlarged perirectal lymph nodes are present.  I suspect that the fatty appearance along the right obturator canal is due to fatty atrophy of the adductor musculature or although a lipoma could have a similar appearance.  There are bilateral chronic pars defects at L5 but with only about 2 mm of anterolisthesis at L5-S1.  Uterus is absent.  Sigmoid diverticulosis noted.  IMPRESSION:  1.  Large rectal mass may extend to or mildly beyond the anus.  The only enlarged lymph node the abdomen and pelvis is in the peripancreatic region, and could possibly be reactive. 2.  No compelling regions of metastatic disease identified. 3.  Extensive subcutaneous edema in the abdomen and pelvis, possibly due to  the patient's hypoproteinemia and hypoalbuminemia. 4.  Sigmoid diverticulosis. 5.  Bilateral pars defects at L5.   Original Report Authenticated By: Van Clines, M.D.    Ct Abdomen Pelvis W Contrast  04/03/2012  *RADIOLOGY REPORT*  Clinical Data:  Rectal tumor, query metastatic disease.  Anemia.  CT CHEST, ABDOMEN AND PELVIS WITH CONTRAST  Technique:  Multidetector CT imaging of the chest, abdomen and pelvis was performed following the standard protocol during bolus administration of intravenous contrast.  Contrast: 58mL OMNIPAQUE IOHEXOL 300 MG/ML  SOLN  Comparison:  04/01/2012  CT CHEST  Findings:  Diffuse prominent subcutaneous edema noted with moderate bilateral pleural effusions and passive atelectasis.  Moderate cardiomegaly  is present.  No pericardial effusion.  Small bilateral axillary lymph nodes are not pathologically enlarged by size criteria.  No pathologic hilar or mediastinal adenopathy.  No definite enhancement along the pleural surfaces to specifically indicate an exudative effusion.  In the upper chest, the subcutaneous edema is especially concentrated anteriorly and along the breasts, whereas in the lower chest and abdomen the extensive subcutaneous edema is circumferential.  Aside from the passive atelectasis, the lungs appear relatively clear.  Despite efforts by the patient and technologist, motion artifact is present on some series of today's examination and could not be totally eliminated.  This reduces diagnostic sensitivity and specificity.  Technical factors related to patient body habitus also reduce diagnostic sensitivity and specificity.  Due to the large body habitus and dilutional effects of the relatively small contrast volume, it is difficult to observe the interventricular septum to tell if there is flattening or contour reversal to indicate elevated right heart pressures.  IMPRESSION:  1.  Cardiomegaly, with moderate bilateral pleural effusions and associated passive  atelectasis, but without overt pulmonary edema noted. 2.  Extensive subcutaneous edema, especially down in the abdomen and lower chest.  In the upper chest, the subcutaneous edema is particularly concentrated anteriorly along the breasts.  The appearance suggests third spacing of fluid possibly from elevated right heart pressures or more likely from the patient's known hypoproteinemia and hypoalbuminemia. 3.  We do not demonstrate compelling evidence of metastatic disease to the chest.  CT ABDOMEN AND PELVIS  Findings:  The liver, spleen, pancreas, and adrenal glands appear unremarkable.  The gallbladder appears contracted. The kidneys appear unremarkable, as do the proximal ureters.  A Foley catheter is present in the otherwise of the urinary bladder.  There is low-level mesenteric edema, but significantly less than the degree of subcutaneous edema present diffusely in the abdomen pelvis, although particularly anteriorly in the lower pelvis and thigh region.  The distal pancreatic node appears to measure 1.4 cm on image 58 of series 2.  Small mesenteric lymph nodes are present along with small retroperitoneal lymph nodes which are not pathologically enlarged by size criteria.  Similarly left external iliac lymph nodes and right external iliac nodes are not pathologically enlarged by size criteria.  No definite nodularity of the omentum or liver margins.  I do not observe a discrete mesenteric mass.  A large mixed density in the expected vicinity of the rectum measures 8.4 x 7.7 cm and appears to displace luminal contrast medium to the right posterior margin.  I expect that this represents the patient's rectal tumor.  Landmarks are obscured but involvement of the anus is quite likely.  There is some lobularity of this mass along the left perirectal region.  No definite nodularity or invasion in the ischiorectal fossa.  No abnormally enlarged perirectal lymph nodes are present.  I suspect that the fatty appearance  along the right obturator canal is due to fatty atrophy of the adductor musculature or although a lipoma could have a similar appearance.  There are bilateral chronic pars defects at L5 but with only about 2 mm of anterolisthesis at L5-S1.  Uterus is absent.  Sigmoid diverticulosis noted.  IMPRESSION:  1.  Large rectal mass may extend to or mildly beyond the anus.  The only enlarged lymph node the abdomen and pelvis is in the peripancreatic region, and could possibly be reactive. 2.  No compelling regions of metastatic disease identified. 3.  Extensive subcutaneous edema in the abdomen and pelvis, possibly due  to the patient's hypoproteinemia and hypoalbuminemia. 4.  Sigmoid diverticulosis. 5.  Bilateral pars defects at L5.   Original Report Authenticated By: Van Clines, M.D.     Scheduled Meds:    . furosemide  40 mg Oral Daily  . hyoscyamine  0.25 mg Sublingual Once  . iron polysaccharides  150 mg Oral Daily  . oxyCODONE-acetaminophen  1 tablet Oral Once  . pantoprazole  40 mg Oral Q0600  . potassium chloride  40 mEq Oral BID  . sodium chloride  10 mL Intravenous Q12H   Continuous Infusions:    . sodium chloride 20 mL/hr at 04/04/12 [id number]    Principal Problem:  *Iron deficiency anemia due to chronic blood loss Active Problems:  GI bleed  Bilateral lower extremity edema  Hemorrhoids  Nonspecific abnormal electrocardiogram (ECG) (EKG)/unspecified ventricular conduction delay  Fecal occult blood test positive  Acute renal failure  Rectal cancer  Gastritis without bleeding    Time spent: 40 min.    Melton Alar  [facility] Pager [phone]. If 8PM-8AM, please contact night-coverage at [email or web address], password [facility] 04/04/2012, 11:58 AM  LOS: 4 days    Attending Patient seen and examined, I reviewed the above assessment and plan. Status post colonoscopy which showed a large partially obstructing rectal mass. Biopsy results pending. There is no evidence of  metastases in the CT scan of the chest and abdomen. She has some mild rectal bleeding overnight-likely losing from the very friable rectal mass. She has been seen by surgery-did recommend adjuvant chemoradiation first prior to any surgical option. Awaiting oncology evaluation. Disposition will depend on oncology evaluation  S Ghimire

## 2012-04-04 NOTE — Progress Notes (Signed)
I participated in the care of this patient, and I agree with the above documentation, including the assessment and plan.

## 2012-04-04 NOTE — Clinical Documentation Improvement (Signed)
MALNUTRITION DOCUMENTATION CLARIFICATION  THIS DOCUMENT IS NOT A PERMANENT PART OF THE MEDICAL RECORD    Please update your documentation within the medical record to reflect your response to this query.                                                                                         04/04/12   Dr. Sloan Leiter and/or Associates,  In a better effort to capture your patient's severity of illness, reflect appropriate length of stay and utilization of resources, a review of the patient medical record has revealed the following indicators:  Patient diagnosed with Rectal Cancer  "Anasarca  Extensive subcutaneous edema in the Chest, Ab, Pelvis evidenced on CT scans.  Likely secondary to severe anemia and low protein (5.3), low albumin (2.2)  Nutrition consultation. Added Ensure.  Will add lasix and monitor closely"  Dr. Sloan Leiter Progress Note 04/04/12  INITIAL NUTRITION ASSESSMENT Sherre Scarlet, RD at 04/04/12 1515  -Non-severe (moderate) malnutrition in the context of social or environmental circumstances  Malnutrition related to decreased appetite as evidenced by unable to meet nutrition needs and fluid accumulation Goal:  PO intake to meet >/=90% estimated nutrition needs INTERVENTION:  1. Ensure Pudding po TID, each supplement provides 170 kcal and 4 grams of protein. As ordered by PA  2. RD will continue to follow   Based on your clinical judgment, please document in the progress notes and discharge summary if a condition below provides greater specificity regarding the patient's nutritional status:   -  Moderate Malnutrition in the context of social or environmental circumstances    - Other condition   - Unable to Clinically Determine   In responding to this query please exercise your independent judgment.    The fact that a query is asked, does not imply that any particular answer is desired or expected.   Reviewed: additional documentation in the medical  record  Thank You,  Erling Conte  RN BSN CCDS Certified Clinical Documentation Specialist: Cell  [phone]  Health Information Management [place]:  1. If needed, update documentation for the patient's encounter via the notes activity.  2. Access this query again and click edit on the [employer].  3. After updating, or not, click F2 to complete all highlighted (required) fields concerning your review. Select "additional documentation in the medical record" OR "no additional documentation provided".  4. Click Sign note button.  5. The deficiency will fall out of your In Basket *Please let us know if you are not able to complete this workflow by phone or e-mail (listed below).

## 2012-04-04 NOTE — Progress Notes (Signed)
Physical Therapy Treatment Patient Details Name: Shannon Terry MRN: [id number] DOB: 14-Oct-1940 Today's Date: 04/04/2012 Time: [id number] PT Time Calculation (min): 10 min  PT Assessment / Plan / Recommendation Comments on Treatment Session  Pt adm with anemia and found to have rectal CA.  Pt with supportive family and should be able to return home with them.    Follow Up Recommendations  Home health PT;Supervision - Intermittent     Does the patient have the potential to tolerate intense rehabilitation     Barriers to Discharge        Equipment Recommendations  None recommended by PT    Recommendations for Other Services    Frequency Min 3X/week   Plan Discharge plan remains appropriate;Frequency remains appropriate    Precautions / Restrictions Precautions Precautions: Fall   Pertinent Vitals/Pain Pt on 2L O2.    Mobility  Transfers Stand to Sit: 5: Supervision;With upper extremity assist;To bed Details for Transfer Assistance: Pt standing at bedside with family. Ambulation/Gait Ambulation/Gait Assistance: 4: Min guard Ambulation Distance (Feet): 60 Feet Assistive device: Rolling walker Ambulation/Gait Assistance Details: Amb on 2L O2.   Gait Pattern: Step-through pattern;Decreased stride length Gait velocity: Less than 1.8 ft/sec    Exercises     PT Diagnosis:    PT Problem List:   PT Treatment Interventions:     PT Goals Acute Rehab PT Goals PT Goal: Stand to Sit - Progress: Met PT Goal: Ambulate - Progress: Progressing toward goal  Visit Information  Last PT Received On: 04/04/12 Assistance Needed: +1    Subjective Data  Subjective: "I might as well," pt stated about walking.   Cognition  Overall Cognitive Status: Appears within functional limits for tasks assessed/performed Arousal/Alertness: Awake/alert Orientation Level: Appears intact for tasks assessed Behavior During Session: [facility] for tasks performed    Balance  Static Standing Balance Static  Standing - Balance Support: Bilateral upper extremity supported (on walker) Static Standing - Level of Assistance: 6: Modified independent (Device/Increase time)  End of Session PT - End of Session Equipment Utilized During Treatment: Oxygen Activity Tolerance: Patient limited by fatigue Patient left: in bed;with call bell/phone within reach;with family/visitor present   GP     [facility] 04/04/2012, 1:47 PM  [facility] PT [phone]

## 2012-04-05 ENCOUNTER — Telehealth: Payer: Self-pay

## 2012-04-05 ENCOUNTER — Other Ambulatory Visit: Payer: Self-pay

## 2012-04-05 DIAGNOSIS — D500 Iron deficiency anemia secondary to blood loss (chronic): Secondary | ICD-10-CM

## 2012-04-05 DIAGNOSIS — K6289 Other specified diseases of anus and rectum: Secondary | ICD-10-CM

## 2012-04-05 DIAGNOSIS — D509 Iron deficiency anemia, unspecified: Secondary | ICD-10-CM

## 2012-04-05 HISTORY — DX: Iron deficiency anemia secondary to blood loss (chronic): D50.0

## 2012-04-05 LAB — BASIC METABOLIC PANEL
CO2: 32 mEq/L (ref 19–32)
Calcium: 7.9 mg/dL — ABNORMAL LOW (ref 8.4–10.5)
Chloride: 108 mEq/L (ref 96–112)
Creatinine, Ser: 0.71 mg/dL (ref 0.50–1.10)
Potassium: 3.1 mEq/L — ABNORMAL LOW (ref 3.5–5.1)
Sodium: 143 mEq/L (ref 135–145)

## 2012-04-05 LAB — CBC
HCT: 30.5 % — ABNORMAL LOW (ref 36.0–46.0)
Hemoglobin: 9.1 g/dL — ABNORMAL LOW (ref 12.0–15.0)
MCHC: 29.8 g/dL — ABNORMAL LOW (ref 30.0–36.0)
WBC: 16.8 10*3/uL — ABNORMAL HIGH (ref 4.0–10.5)

## 2012-04-05 MED ORDER — POLYSACCHARIDE IRON COMPLEX 150 MG PO CAPS: 150.0000 mg | ORAL_CAPSULE | Freq: Every day | ORAL | Status: DC

## 2012-04-05 MED ORDER — ENSURE PUDDING PO PUDG: 1.0000 | Freq: Three times a day (TID) | ORAL | Status: DC

## 2012-04-05 MED ORDER — ONDANSETRON HCL 4 MG PO TABS: 4.0000 mg | ORAL_TABLET | Freq: Four times a day (QID) | ORAL | Status: DC | PRN

## 2012-04-05 MED ORDER — POLYETHYLENE GLYCOL 3350 17 G PO PACK: 17.0000 g | PACK | Freq: Two times a day (BID) | ORAL | Status: DC

## 2012-04-05 MED ORDER — POTASSIUM CHLORIDE ER 10 MEQ PO TBCR: 10.0000 meq | EXTENDED_RELEASE_TABLET | Freq: Two times a day (BID) | ORAL | Status: DC

## 2012-04-05 MED ORDER — SODIUM CHLORIDE 0.9 % IV SOLN
INTRAVENOUS | Status: AC
  Administered 2012-04-05: 20:00:00 via INTRAVENOUS

## 2012-04-05 MED ORDER — MEGESTROL ACETATE 40 MG PO TABS: 40.0000 mg | ORAL_TABLET | Freq: Every day | ORAL | Status: DC

## 2012-04-05 MED ORDER — OXYCODONE-ACETAMINOPHEN 5-325 MG PO TABS: 1.0000 | ORAL_TABLET | Freq: Once | ORAL | Status: DC

## 2012-04-05 MED ORDER — DOCUSATE SODIUM 100 MG PO CAPS
100.0000 mg | ORAL_CAPSULE | Freq: Two times a day (BID) | ORAL | Status: DC
  Administered 2012-04-05 – 2012-04-06 (×3): 100 mg via ORAL
  Filled 2012-04-05 (×4): qty 1

## 2012-04-05 MED ORDER — MEGESTROL ACETATE 40 MG PO TABS
40.0000 mg | ORAL_TABLET | Freq: Every day | ORAL | Status: DC
  Administered 2012-04-06: 40 mg via ORAL
  Filled 2012-04-05 (×2): qty 1

## 2012-04-05 MED ORDER — POLYETHYLENE GLYCOL 3350 17 G PO PACK
17.0000 g | PACK | Freq: Two times a day (BID) | ORAL | Status: DC
  Administered 2012-04-05 – 2012-04-06 (×3): 17 g via ORAL
  Filled 2012-04-05 (×4): qty 1

## 2012-04-05 MED ORDER — FUROSEMIDE 40 MG PO TABS: 40.0000 mg | ORAL_TABLET | Freq: Every day | ORAL | Status: DC

## 2012-04-05 NOTE — Progress Notes (Signed)
Patient discharged today with Onc/ Rad Onc/ GI follow-up.  Will see surgeon at later date.  Imogene Burn. Georgette Dover, MD, [facility] Surgery  04/05/2012 1:51 PM

## 2012-04-05 NOTE — Progress Notes (Signed)
Pt voided but forgot to catch urine in cap. Pt voided a good amount of urine. RN will continue to monitor pt's output.

## 2012-04-05 NOTE — Progress Notes (Signed)
Patient's foley removed earlier today-still with no urine output. Hold Discharge. Start trial of 0.9 NS at 100 cc/hr for 5 hours and recheck bladder scan. Check UA and BMET in am Family updated at bedside

## 2012-04-05 NOTE — Progress Notes (Signed)
04/05/12 Patient was ambulated without 02 remained at 94% without lowering. Patient to go home today f/c removed,iv site removed and discharge instructions reviewed.

## 2012-04-05 NOTE — Telephone Encounter (Signed)
Message copied by Barron Alvine on Fri Apr 05, 2012 10:55 AM ------      Message from: Milus Banister      Created: Fri Apr 05, 2012 10:34 AM      Regarding: RE: Rectal EUS       Laurena Slimmer git er done.  Looking at this coming Monday afternoon.  I'll have Yousef Huge book it, assuming she will be coming from home.             Thanks            Timiko Offutt,      She needs lower EUS, radial only, this Monday to follow the other lower EUS already scheduled. Rectal cancer dx.  Thanks. Moderate sedation is fine for both of those cases.            dj                  ----- Message -----         From: Jerene Bears, MD         Sent: 04/05/2012   9:44 AM           To: Milus Banister, MD      Subject: Rectal EUS                                               Dan      The above is a lady I saw this week in consult for Hgb 2.9 (no medical care basically ever).  She has a large rectal tumor extending to the anus.      Path pending, but this is cancer      CT shows only the rectal tumor without def metastatic disease      Ennever has requested EUS      If you could please take a look at her chart.  I am a bit concerned b/c the mass is large and I am not sure the EUS probe with pass the lesion.  I had to use an EGD scope to do her colon due to narrowing and friability of the mass.      Thanks in advance             [place]

## 2012-04-05 NOTE — Progress Notes (Signed)
Patient ID: Shannon Terry, female   DOB: Nov 13, 1940, 72 y.o.   MRN: [id number] 2 Days Post-Op  Subjective: Had rough night due to urinary frequency then urinary retention resulting in foley being placed.  Denies abd pain/n/v.  +pain with BM/strain  Objective: Vital signs in last 24 hours: Temp:  [98.3 F (36.8 C)-98.5 F (36.9 C)] 98.3 F (36.8 C) (01/31 0543) Pulse Rate:  [75-89] 89  (01/31 0543) Resp:  [20] 20  (01/31 0543) BP: (123-146)/(60-80) 123/60 mmHg (01/31 0543) SpO2:  [96 %-98 %] 98 % (01/31 0543) Last BM Date: 04/04/12  Intake/Output from previous day: 01/30 0701 - 01/31 0700 In: 580 [P.O.:580] Out: 4900 [Urine:4900] Intake/Output this shift:    PE: Abd: soft, non tender, +BS General: tired but awake and alert  Lab Results:   Basename 04/05/12 0630 04/04/12 0918  WBC 16.8* 15.6*  HGB 9.1* 10.1*  HCT 30.5* 33.8*  PLT 400 382   BMET  Basename 04/05/12 0630 04/04/12 0918  NA 143 143  K 3.1* 3.1*  CL 108 107  CO2 32 27  GLUCOSE 68* 93  BUN 5* 6  CREATININE 0.71 0.71  CALCIUM 7.9* 8.1*   PT/INR No results found for this basename: LABPROT:2,INR:2 in the last 72 hours CMP     Component Value Date/Time   NA 143 04/05/2012 0630   K 3.1* 04/05/2012 0630   CL 108 04/05/2012 0630   CO2 32 04/05/2012 0630   GLUCOSE 68* 04/05/2012 0630   BUN 5* 04/05/2012 0630   CREATININE 0.71 04/05/2012 0630   CALCIUM 7.9* 04/05/2012 0630   PROT 5.3* 04/03/2012 0305   ALBUMIN 2.2* 04/03/2012 0305   AST 17 04/03/2012 0305   ALT 12 04/03/2012 0305   ALKPHOS 100 04/03/2012 0305   BILITOT 0.6 04/03/2012 0305   GFRNONAA 86* 04/05/2012 0630   GFRAA >90 04/05/2012 0630   Lipase  No results found for this basename: lipase       Studies/Results: Ct Chest W Contrast  04/03/2012  *RADIOLOGY REPORT*  Clinical Data:  Rectal tumor, query metastatic disease.  Anemia.  CT CHEST, ABDOMEN AND PELVIS WITH CONTRAST  Technique:  Multidetector CT imaging of the chest, abdomen and pelvis was  performed following the standard protocol during bolus administration of intravenous contrast.  Contrast: 20mL OMNIPAQUE IOHEXOL 300 MG/ML  SOLN  Comparison:  04/01/2012  CT CHEST  Findings:  Diffuse prominent subcutaneous edema noted with moderate bilateral pleural effusions and passive atelectasis.  Moderate cardiomegaly is present.  No pericardial effusion.  Small bilateral axillary lymph nodes are not pathologically enlarged by size criteria.  No pathologic hilar or mediastinal adenopathy.  No definite enhancement along the pleural surfaces to specifically indicate an exudative effusion.  In the upper chest, the subcutaneous edema is especially concentrated anteriorly and along the breasts, whereas in the lower chest and abdomen the extensive subcutaneous edema is circumferential.  Aside from the passive atelectasis, the lungs appear relatively clear.  Despite efforts by the patient and technologist, motion artifact is present on some series of today's examination and could not be totally eliminated.  This reduces diagnostic sensitivity and specificity.  Technical factors related to patient body habitus also reduce diagnostic sensitivity and specificity.  Due to the large body habitus and dilutional effects of the relatively small contrast volume, it is difficult to observe the interventricular septum to tell if there is flattening or contour reversal to indicate elevated right heart pressures.  IMPRESSION:  1.  Cardiomegaly, with moderate bilateral pleural  effusions and associated passive atelectasis, but without overt pulmonary edema noted. 2.  Extensive subcutaneous edema, especially down in the abdomen and lower chest.  In the upper chest, the subcutaneous edema is particularly concentrated anteriorly along the breasts.  The appearance suggests third spacing of fluid possibly from elevated right heart pressures or more likely from the patient's known hypoproteinemia and hypoalbuminemia. 3.  We do not  demonstrate compelling evidence of metastatic disease to the chest.  CT ABDOMEN AND PELVIS  Findings:  The liver, spleen, pancreas, and adrenal glands appear unremarkable.  The gallbladder appears contracted. The kidneys appear unremarkable, as do the proximal ureters.  A Foley catheter is present in the otherwise of the urinary bladder.  There is low-level mesenteric edema, but significantly less than the degree of subcutaneous edema present diffusely in the abdomen pelvis, although particularly anteriorly in the lower pelvis and thigh region.  The distal pancreatic node appears to measure 1.4 cm on image 58 of series 2.  Small mesenteric lymph nodes are present along with small retroperitoneal lymph nodes which are not pathologically enlarged by size criteria.  Similarly left external iliac lymph nodes and right external iliac nodes are not pathologically enlarged by size criteria.  No definite nodularity of the omentum or liver margins.  I do not observe a discrete mesenteric mass.  A large mixed density in the expected vicinity of the rectum measures 8.4 x 7.7 cm and appears to displace luminal contrast medium to the right posterior margin.  I expect that this represents the patient's rectal tumor.  Landmarks are obscured but involvement of the anus is quite likely.  There is some lobularity of this mass along the left perirectal region.  No definite nodularity or invasion in the ischiorectal fossa.  No abnormally enlarged perirectal lymph nodes are present.  I suspect that the fatty appearance along the right obturator canal is due to fatty atrophy of the adductor musculature or although a lipoma could have a similar appearance.  There are bilateral chronic pars defects at L5 but with only about 2 mm of anterolisthesis at L5-S1.  Uterus is absent.  Sigmoid diverticulosis noted.  IMPRESSION:  1.  Large rectal mass may extend to or mildly beyond the anus.  The only enlarged lymph node the abdomen and pelvis is in  the peripancreatic region, and could possibly be reactive. 2.  No compelling regions of metastatic disease identified. 3.  Extensive subcutaneous edema in the abdomen and pelvis, possibly due to the patient's hypoproteinemia and hypoalbuminemia. 4.  Sigmoid diverticulosis. 5.  Bilateral pars defects at L5.   Original Report Authenticated By: Van Clines, M.D.    Ct Abdomen Pelvis W Contrast  04/03/2012  *RADIOLOGY REPORT*  Clinical Data:  Rectal tumor, query metastatic disease.  Anemia.  CT CHEST, ABDOMEN AND PELVIS WITH CONTRAST  Technique:  Multidetector CT imaging of the chest, abdomen and pelvis was performed following the standard protocol during bolus administration of intravenous contrast.  Contrast: 45mL OMNIPAQUE IOHEXOL 300 MG/ML  SOLN  Comparison:  04/01/2012  CT CHEST  Findings:  Diffuse prominent subcutaneous edema noted with moderate bilateral pleural effusions and passive atelectasis.  Moderate cardiomegaly is present.  No pericardial effusion.  Small bilateral axillary lymph nodes are not pathologically enlarged by size criteria.  No pathologic hilar or mediastinal adenopathy.  No definite enhancement along the pleural surfaces to specifically indicate an exudative effusion.  In the upper chest, the subcutaneous edema is especially concentrated anteriorly and along the breasts, whereas  in the lower chest and abdomen the extensive subcutaneous edema is circumferential.  Aside from the passive atelectasis, the lungs appear relatively clear.  Despite efforts by the patient and technologist, motion artifact is present on some series of today's examination and could not be totally eliminated.  This reduces diagnostic sensitivity and specificity.  Technical factors related to patient body habitus also reduce diagnostic sensitivity and specificity.  Due to the large body habitus and dilutional effects of the relatively small contrast volume, it is difficult to observe the interventricular septum to  tell if there is flattening or contour reversal to indicate elevated right heart pressures.  IMPRESSION:  1.  Cardiomegaly, with moderate bilateral pleural effusions and associated passive atelectasis, but without overt pulmonary edema noted. 2.  Extensive subcutaneous edema, especially down in the abdomen and lower chest.  In the upper chest, the subcutaneous edema is particularly concentrated anteriorly along the breasts.  The appearance suggests third spacing of fluid possibly from elevated right heart pressures or more likely from the patient's known hypoproteinemia and hypoalbuminemia. 3.  We do not demonstrate compelling evidence of metastatic disease to the chest.  CT ABDOMEN AND PELVIS  Findings:  The liver, spleen, pancreas, and adrenal glands appear unremarkable.  The gallbladder appears contracted. The kidneys appear unremarkable, as do the proximal ureters.  A Foley catheter is present in the otherwise of the urinary bladder.  There is low-level mesenteric edema, but significantly less than the degree of subcutaneous edema present diffusely in the abdomen pelvis, although particularly anteriorly in the lower pelvis and thigh region.  The distal pancreatic node appears to measure 1.4 cm on image 58 of series 2.  Small mesenteric lymph nodes are present along with small retroperitoneal lymph nodes which are not pathologically enlarged by size criteria.  Similarly left external iliac lymph nodes and right external iliac nodes are not pathologically enlarged by size criteria.  No definite nodularity of the omentum or liver margins.  I do not observe a discrete mesenteric mass.  A large mixed density in the expected vicinity of the rectum measures 8.4 x 7.7 cm and appears to displace luminal contrast medium to the right posterior margin.  I expect that this represents the patient's rectal tumor.  Landmarks are obscured but involvement of the anus is quite likely.  There is some lobularity of this mass along  the left perirectal region.  No definite nodularity or invasion in the ischiorectal fossa.  No abnormally enlarged perirectal lymph nodes are present.  I suspect that the fatty appearance along the right obturator canal is due to fatty atrophy of the adductor musculature or although a lipoma could have a similar appearance.  There are bilateral chronic pars defects at L5 but with only about 2 mm of anterolisthesis at L5-S1.  Uterus is absent.  Sigmoid diverticulosis noted.  IMPRESSION:  1.  Large rectal mass may extend to or mildly beyond the anus.  The only enlarged lymph node the abdomen and pelvis is in the peripancreatic region, and could possibly be reactive. 2.  No compelling regions of metastatic disease identified. 3.  Extensive subcutaneous edema in the abdomen and pelvis, possibly due to the patient's hypoproteinemia and hypoalbuminemia. 4.  Sigmoid diverticulosis. 5.  Bilateral pars defects at L5.   Original Report Authenticated By: Van Clines, M.D.     Anti-infectives: Anti-infectives    None       Assessment/Plan 1. Large, very low, partially obstructing rectal cancer: oncology has seen patient, at current  no urgent surgical intervention needed, need mass to be reduced in size before considering resection, diet as tolerated, will follow patient while in hospital and set up OP follow up appts.  LOS: 5 days    Marinna Blane 04/05/2012

## 2012-04-05 NOTE — Progress Notes (Signed)
04/05/12 Foley catheter was removed this morning. She has not voided since 0900 after the foley was removed.

## 2012-04-05 NOTE — Telephone Encounter (Signed)
Pt has been scheduled and Azucena Freed has been notified and will give the pt the information

## 2012-04-05 NOTE — Discharge Summary (Addendum)
Physician Discharge Summary  Shannon Terry [id number] DOB: 1941/01/14 DOA: 03/31/2012  PCP: Shannon Amel, MD  Admit date: 03/31/2012 Discharge date: 04/06/2012  Time spent: 60 minutes  Recommendations for Outpatient Follow-up:   She will need a CBC and a bmet in 1-2 weeks to ensure her hemoglobin is stable this will be done health nurse. Results will be sent to both Dr. Dorthy Terry and Dr. Marin Terry. (Fax [phone])  She will be scheduled with [facility] for Endoscopic Ultrasound rectal mass.  She will also be scheduled to see radiation oncology for an outpatient consultation.  At the appropriate time she will be referred back to Center: Surgery for resection of her mass.  We have requested home health RN and physical therapy.    Discharge Diagnoses:  Principal Problem:  *Iron deficiency anemia due to chronic blood loss Active Problems:  GI bleed  Bilateral lower extremity edema  Hemorrhoids  Nonspecific abnormal electrocardiogram (ECG) (EKG)/unspecified ventricular conduction delay  Fecal occult blood test positive  Acute renal failure  Rectal cancer  Gastritis without bleeding   Discharge Condition: Stable but weak. She is still having some blood with her bowel movements. This is to be expected as she has a very friable rectal mass.   Diet recommendation: As tolerated. It is recommended that she receive several protein drinks per day either boost or Ensure.   Filed Weights   04/01/12 0130 04/01/12 0800 04/03/12 0210  Weight: 108.6 kg (239 lb 6.7 oz) 108.6 kg (239 lb 6.7 oz) 108.047 kg (238 lb 3.2 oz)    History of present illness:  [name] is a 72 year old African American female who has not seen a physician for many years. She began experiencing some blood loss with her stools approximately one year ago. The day prior to admission she became. Weak and had to use a walker to get around. In the emergency department she was found to have a hemoglobin of 2.9. She was  admitted for blood transfusion and further workup.   Hospital Course:   Microcytic severe Iron deficiency anemia due to chronic, slow, GI blood loss  Received 6 units. Hgb now 9.1 on 04/05/2012. Received IV iron (feraheme 1020 mg) 1/29.  Started on oral iron therapy (Nu-iron, non-constipating)  A CBC will be checked in one week the results of which will be sent to both oncology and her primary care physician.  GI bleed/Fecal occult blood test positive- likely secondary to rectal cancer  On colonoscopy 1/29 circumferential, partially obstructing, friable, fungating rectal mass was found at the distal end of her rectum.. Most likely rectal cancer. Biopsies are still pending. Mild antral gastropathy noted on upper endoscopy. Biopsy results for H. pylori are still pending.  CT abdomen/pelvis and chest showed no mets, but did show anasarca in the abdomen.  Surgery has evaluated the patient. They recommend attempting to shrink the tumor prior to surgery in the hopes of saving a portion of the rectum and avoiding a permanent colostomy.  Oncology consulted and recommended radiation with radiation enhancing medication, Xeloda.  She will followup as an outpatient with radiation oncology. She will have a endoscopic ultrasound of her rectum done as an outpatient by gastroenterology.  She has been asked to be on a bowel regimen to keep her stools as soft as possible  Anasarca  Extensive subcutaneous edema in the Chest, Ab, Pelvis evidenced on CT scans.  Likely secondary to severe anemia and low protein (5.3), low albumin (2.2).  Nutrition consulted and added Ensure 3 times a  day. Lasix 40 mg daily has been added to reduce subcutaneous fluid. She will need a bmet in 1 week.  Hypokalemia  Secondary to GI loss.  This was repleted with oral supplementation. She'll have a bmet in 1 week secondary to starting Lasix.  Diminished left dorsalis pedis pulse/? PVD  Noted on exam-no apparent claudication. ABI  within normal limits.   Bilateral lower extremity edema  Likely 2/2 profound chronic anemia w/ assoc low intravascular oncotic pressure  Attempt to diurese with lasix  Protein calorie malnutrition - Moderate - Continue supplements  Acute renal failure  Resolved. Creatinine now normal. Renal dysfunction acute and 2/2 to anemia/low perfusion   Nonspecific abnormal electrocardiogram / unspecified ventricular conduction delay  Mild pulmonary hypertension and grade 1 diastolic dysfunction on echo.the patient will be discharged on Lasix. This should be monitored by her primary care physician.   Procedures:  Colonoscopy ENDOSCOPIC IMPRESSION:  1. Malignant tumor in the rectum with contact bleeding; partial obstructing; multiple biopsies of the lesion were performed  2. Mild diverticulosis was noted in the descending colon and sigmoid colon  RECOMMENDATIONS:  1. Await pathology results  2. CT scan chest, abdomen, and pelvis.  3. Surgical and oncologic consultations   Endoscopy ENDOSCOPIC IMPRESSION:  1. The mucosa of the esophagus appeared normal  2. There was mild antral gastropathy noted; biopsies obtained  3. The stomach otherwise appeared normal  4. The duodenal mucosa showed no abnormalities in the bulb and second portion of the duodenum  RECOMMENDATIONS:  1. Await pathology results  2. Follow-up of helicobacter pylori status, treat if indicated     2 D Echo - Left ventriicle: The cavity size was normal. Systolic function was normal. The estimated ejection fraction was in the range of 55% to 60%. Wall motion was normal; there and he is in and an old or Vira Agar is will this is chief rate is is a is a will be and every and the and a half taking it off now and her pulse ox he orders here at is a hair thyroid for is an in were no regional wall motion abnormalities. There was an increased relative contribution of atrial contraction to ventricular filling. Doppler parameters are  consistent with abnormal left ventricular relaxation (grade 1diastolic dysfunction). - Mitral valve: Mild regurgitation. - Left atrium: The atrium was mildly dilated. - Atrial septum: No defect or patent foramen ovale was identified. - Pulmonary arteries: PA peak pressure: 77mm Hg (S). Impressions: - The right ventricular systolic pressure was increased consistent with mild pulmonary hypertension.    Consultations:  gastroenterology Oncology General surgery   Discharge Exam: Filed Vitals:   04/05/12 1100 04/05/12 1420 04/05/12 2122 04/06/12 0538  BP:  138/69 122/75 134/79  Pulse:  85 88 82  Temp:  98.3 F (36.8 C) 98.9 F (37.2 C) 97.9 F (36.6 C)  TempSrc:  Oral Oral Oral  Resp:  20 18 18   Height:      Weight:      SpO2: 94% 97% 97% 96%    General: well-developed well-nourished [place] American female, elderly, lying in bed, appears comfortable  Cardiovascular: regular rate and rhythm, no murmurs rubs or gallops Respiratory: Clear to auscultation, no wheezes crackles or rales  Abdomen: Obese, soft, nontender, nondistended, no masses, positive bowel sounds   extremities: Lower extremities have minimal edema bilaterally  Skin:  Ms. Nacke has bruising on the left side of her neck from a left IJ.    Discharge Instructions  Discharge Orders    Future Appointments: Provider: Department: Dept Phone: Center:   04/11/2012 8:30 AM [facility] [place] [phone] None   04/11/2012 9:00 AM Marye Round, MD [place] [phone] None     Future Orders Please Complete By Expires   Diet - low sodium heart healthy      Increase activity slowly          Medication List     As of 04/06/2012 10:10 AM    STOP taking these medications         aspirin 325 MG buffered tablet      TAKE these medications         feeding supplement Pudg   Take 1 Container by mouth 3 (three) times daily between  meals.      furosemide 40 MG tablet   Commonly known as: LASIX   Take 0.5 tablets (20 mg total) by mouth daily as needed.      iron polysaccharides 150 MG capsule   Commonly known as: NIFEREX   Take 1 capsule (150 mg total) by mouth daily.      megestrol 40 MG tablet   Commonly known as: MEGACE   Take 1 tablet (40 mg total) by mouth daily.      ondansetron 4 MG tablet   Commonly known as: ZOFRAN   Take 1 tablet (4 mg total) by mouth every 6 (six) hours as needed for nausea.      oxyCODONE-acetaminophen 5-325 MG per tablet   Commonly known as: PERCOCET/ROXICET   Take 1 tablet by mouth once.      polyethylene glycol packet   Commonly known as: MIRALAX / GLYCOLAX   Take 17 g by mouth 2 (two) times daily.      potassium chloride 10 MEQ tablet   Commonly known as: K-DUR   Take 1 tablet (10 mEq total) by mouth 2 (two) times daily.         Follow-up Information    Schedule an appointment as soon as possible for a visit in 1 week to follow up. (They Should call you with an appointment in approximately 1 week.)    Contact information:   Radiation Oncology  [phone] at [facility]      Follow up with Volanda Napoleon, MD. Schedule an appointment as soon as possible for a visit in 2 weeks. (His office will call you with an appointment)    Contact information:   [address] [place] [address] [phone]       Follow up with Shannon Amel, MD. On 04/09/2012. (10:30 , bring id and insurance infor, come 15 min early for paperwork)    Contact information:   [address] [place] [address] [phone]       Follow up with [place]. On 04/08/2012. (arrive at Registration at [facility]  at noon.  DO NOT EAT OR DRINK ANYTHING AFTER MINDNITE THE NIGHT BEFORE PROCEDURE.  THIS IS FOR AN ENDOSCOPIC ULTRASOUND.  FOLLOW ATTACHED INSTRUCTIONS REGARDING DIET AND BOWEL PREP FOR FEB 2ND, )    Contact  information:   [address] [id number] [place] [phone]          The results of significant diagnostics from this hospitalization (including imaging, microbiology, ancillary and laboratory) are listed below for reference.    Significant Diagnostic Studies: Ct Chest W Contrast  04/03/2012  *RADIOLOGY REPORT*  Clinical Data:  Rectal tumor, query metastatic disease.  Anemia.  CT CHEST, ABDOMEN AND PELVIS WITH CONTRAST  Technique:  Multidetector CT imaging of the chest, abdomen and pelvis was performed following the standard protocol during bolus administration of intravenous contrast.  Contrast: 6mL OMNIPAQUE IOHEXOL 300 MG/ML  SOLN  Comparison:  04/01/2012  CT CHEST  Findings:  Diffuse prominent subcutaneous edema noted with moderate bilateral pleural effusions and passive atelectasis.  Moderate cardiomegaly is present.  No pericardial effusion.  Small bilateral axillary lymph nodes are not pathologically enlarged by size criteria.  No pathologic hilar or mediastinal adenopathy.  No definite enhancement along the pleural surfaces to specifically indicate an exudative effusion.  In the upper chest, the subcutaneous edema is especially concentrated anteriorly and along the breasts, whereas in the lower chest and abdomen the extensive subcutaneous edema is circumferential.  Aside from the passive atelectasis, the lungs appear relatively clear.  Despite efforts by the patient and technologist, motion artifact is present on some series of today's examination and could not be totally eliminated.  This reduces diagnostic sensitivity and specificity.  Technical factors related to patient body habitus also reduce diagnostic sensitivity and specificity.  Due to the large body habitus and dilutional effects of the relatively small contrast volume, it is difficult to observe the interventricular septum to tell if there is flattening or contour reversal to indicate elevated  right heart pressures.  IMPRESSION:  1.  Cardiomegaly, with moderate bilateral pleural effusions and associated passive atelectasis, but without overt pulmonary edema noted. 2.  Extensive subcutaneous edema, especially down in the abdomen and lower chest.  In the upper chest, the subcutaneous edema is particularly concentrated anteriorly along the breasts.  The appearance suggests third spacing of fluid possibly from elevated right heart pressures or more likely from the patient's known hypoproteinemia and hypoalbuminemia. 3.  We do not demonstrate compelling evidence of metastatic disease to the chest.  CT ABDOMEN AND PELVIS  Findings:  The liver, spleen, pancreas, and adrenal glands appear unremarkable.  The gallbladder appears contracted. The kidneys appear unremarkable, as do the proximal ureters.  A Foley catheter is present in the otherwise of the urinary bladder.  There is low-level mesenteric edema, but significantly less than the degree of subcutaneous edema present diffusely in the abdomen pelvis, although particularly anteriorly in the lower pelvis and thigh region.  The distal pancreatic node appears to measure 1.4 cm on image 58 of series 2.  Small mesenteric lymph nodes are present along with small retroperitoneal lymph nodes which are not pathologically enlarged by size criteria.  Similarly left external iliac lymph nodes and right external iliac nodes are not pathologically enlarged by size criteria.  No definite nodularity of the omentum or liver margins.  I do not observe a discrete mesenteric mass.  A large mixed density in the expected vicinity of the rectum measures 8.4 x 7.7 cm and appears to displace luminal contrast medium to the right posterior margin.  I expect that this represents the patient's rectal tumor.  Landmarks are obscured but involvement of the anus is quite likely.  There is some lobularity of this mass along the left perirectal region.  No definite nodularity or invasion in the  ischiorectal fossa.  No abnormally enlarged perirectal lymph nodes are present.  I suspect that the fatty appearance along the right obturator canal is due to fatty atrophy of the adductor musculature or although a lipoma could have a similar appearance.  There  are bilateral chronic pars defects at L5 but with only about 2 mm of anterolisthesis at L5-S1.  Uterus is absent.  Sigmoid diverticulosis noted.  IMPRESSION:  1.  Large rectal mass may extend to or mildly beyond the anus.  The only enlarged lymph node the abdomen and pelvis is in the peripancreatic region, and could possibly be reactive. 2.  No compelling regions of metastatic disease identified. 3.  Extensive subcutaneous edema in the abdomen and pelvis, possibly due to the patient's hypoproteinemia and hypoalbuminemia. 4.  Sigmoid diverticulosis. 5.  Bilateral pars defects at L5.   Original Report Authenticated By: Van Clines, M.D.    Dg Chest Port 1 View  04/01/2012  *RADIOLOGY REPORT*  Clinical Data: Line placement  PORTABLE CHEST - 1 VIEW  Comparison: 03/31/2012  Findings: Right IJ catheter placement in the interval with tip projecting over the proximal SVC.  No pneumothorax.  Cardiomegaly. Central vascular congestion.  Mild interstitial and airspace opacities. There may be trace effusions.  Multilevel degenerative changes.  IMPRESSION: Right IJ catheter placed with tip projecting over the proximal SVC. No pneumothorax.  Cardiomegaly with central vascular congestion and mild edema pattern, similar to prior.   Original Report Authenticated By: Carlos Levering, M.D.    Dg Chest Port 1 View  03/31/2012  *RADIOLOGY REPORT*  Clinical Data: Shortness of breath.  PORTABLE CHEST - 1 VIEW  Comparison: None.  Findings: There is marked enlargement of the cardiopericardial silhouette with vascular congestion.  No consolidative process, pneumothorax or effusion is identified.  Study is limited by the patient's body habitus and portable technique.   IMPRESSION: Marked enlargement of the cardiopericardial silhouette compatible with cardiomegaly and/or pericardial effusion.  Pulmonary vascular congestion without frank edema.   Original Report Authenticated By: Orlean Patten, M.D.     Microbiology: Recent Results (from the past 240 hour(s))  MRSA PCR SCREENING     Status: Normal   Collection Time   04/01/12  1:00 AM      Component Value Range Status Comment   MRSA by PCR NEGATIVE  NEGATIVE Final   URINE CULTURE     Status: Normal   Collection Time   04/01/12  1:00 AM      Component Value Range Status Comment   Specimen Description URINE, CATHETERIZED   Final    Special Requests NONE   Final    Culture  Setup Time 04/01/2012 01:40   Final    Colony Count NO GROWTH   Final    Culture NO GROWTH   Final    Report Status 04/01/2012 FINAL   Final      Labs: Basic Metabolic Panel:  Lab 0000000 0630 04/04/12 0918 04/03/12 0305 04/01/12 0500 03/31/12 2039  NA 143 143 143 143 140  K 3.1* 3.1* 2.8* 3.8 4.1  CL 108 107 108 110 105  CO2 32 27 30 27 24   GLUCOSE 68* 93 102* 89 108*  BUN 5* 6 6 13 14   CREATININE 0.71 0.71 0.67 0.89 0.96  CALCIUM 7.9* 8.1* 8.0* 8.0* 8.2*  MG -- -- -- -- --  PHOS -- -- -- -- --   Liver Function Tests:  Lab 04/03/12 0305 03/31/12 2039  AST 17 25  ALT 12 16  ALKPHOS 100 120*  BILITOT 0.6 0.4  PROT 5.3* 6.1  ALBUMIN 2.2* 2.8*   CBC:  Lab 04/05/12 0630 04/04/12 0918 04/03/12 0305 04/02/12 0820 04/01/12 2117 03/31/12 2039  WBC 16.8* 15.6* 14.9* 17.3* 13.9* --  NEUTROABS -- -- -- -- --  3.9  HGB 9.1* 10.1* 9.8* 9.8* 9.3* --  HCT 30.5* 33.8* 32.2* 31.3* 29.5* --  MCV 72.8* 72.4* 70.2* 69.7* 69.2* --  PLT 400 382 431* 438* 420* --   BNP: BNP (last 3 results)  Basename 03/31/12 2039  PROBNP 2414.0*   CBG:  Lab 04/02/12 0817 04/01/12 1601 04/01/12 1259 04/01/12 0740 04/01/12 0104  GLUCAP 83 101* 86 85 80    Signed:  Allie Dimmer [phone]   [facility] 04/06/2012,  10:10 AM  Attending - Patient seen and examined, agree with the above assessment and plan. Patient has seen Dr. Marin Terry from oncology, patient is scheduled for a rectal ultrasound and also will be scheduled for outpatient radiation oncology evaluation. [facility] and the cancer center will call the patient for the above. - Her hemoglobin and hematocrit are stable, she has been seen by physical therapy services and deemed to need home health physical therapy which has been arranged. - She does have leukocytosis-but urine cultures are negative. CT chest was negative for any consolidation. She's been persistently afebrile. She's also not toxic looking.? Is this from the rectal mass and associated inflammation - She is still slightly volume overloaded, as a result she will be discharged on Lasix and will need close electrolyte monitoring. - She is stable enough to be discharged home today, for further workup to be done in the outpatient setting. I had a long discussion with the patient's daughter at bedside with his RN herself and explained all the the above. All her questions were answered.  S Latria Mccarron

## 2012-04-05 NOTE — Progress Notes (Signed)
I contacted Dr. Eugenia Pancoast regarding rectal EUS for staging He will likely be able to perform this next week and the patient will be contacted regarding scheduling. This exam may be Monday, February 3, assuming patient is out of the hospital

## 2012-04-05 NOTE — Progress Notes (Signed)
04/05/12 Bladder scan done has 126 cc in bladder refuses to drink at this time.

## 2012-04-06 ENCOUNTER — Inpatient Hospital Stay (HOSPITAL_COMMUNITY): Payer: Medicare Other

## 2012-04-06 LAB — CBC
HCT: 33.3 % — ABNORMAL LOW (ref 36.0–46.0)
MCHC: 30 g/dL (ref 30.0–36.0)
MCV: 73.8 fL — ABNORMAL LOW (ref 78.0–100.0)

## 2012-04-06 LAB — DIFFERENTIAL
Basophils Absolute: 0.2 10*3/uL — ABNORMAL HIGH (ref 0.0–0.1)
Eosinophils Absolute: 0.3 10*3/uL (ref 0.0–0.7)
Lymphs Abs: 1.9 10*3/uL (ref 0.7–4.0)
Neutro Abs: 12.2 10*3/uL — ABNORMAL HIGH (ref 1.7–7.7)

## 2012-04-06 LAB — BASIC METABOLIC PANEL
BUN: 5 mg/dL — ABNORMAL LOW (ref 6–23)
Chloride: 106 mEq/L (ref 96–112)
Creatinine, Ser: 0.74 mg/dL (ref 0.50–1.10)
GFR calc Af Amer: >90 mL/min (ref >90)
GFR calc non Af Amer: 85 mL/min — ABNORMAL LOW (ref >90)

## 2012-04-06 MED ORDER — FUROSEMIDE 40 MG PO TABS: 20.0000 mg | ORAL_TABLET | Freq: Every day | ORAL | Status: DC | PRN

## 2012-04-06 NOTE — Progress Notes (Signed)
04/06/12 patient was discharged home today due to ability to void without problem. IV site removed and discharge instructions reviewed.

## 2012-04-06 NOTE — Progress Notes (Signed)
3 Days Post-Op  Subjective: Stable, she was kept overnight because of low urine output.  She is having BM. Pt being scheduled for radiation and chemo Rx before surgery.    Objective: Vital signs in last 24 hours: Temp:  [97.9 F (36.6 C)-98.9 F (37.2 C)] 97.9 F (36.6 C) (02/01 0538) Pulse Rate:  [82-88] 82  (02/01 0538) Resp:  [18-20] 18  (02/01 0538) BP: (122-138)/(69-79) 134/79 mmHg (02/01 0538) SpO2:  [94 %-97 %] 96 % (02/01 0538) Last BM Date: 04/06/12 +BM, not much urine recorded, d/c held for lower urine output. Afebrile,VSS, K+3.1 yesterday.  Intake/Output from previous day: 01/31 0701 - 02/01 0700 In: 480 [P.O.:480] Out: 100 [Urine:100] Intake/Output this shift:    General appearance: alert, cooperative and no distress  Lab Results:   Basename 04/05/12 0630 04/04/12 0918  WBC 16.8* 15.6*  HGB 9.1* 10.1*  HCT 30.5* 33.8*  PLT 400 382    BMET  Basename 04/05/12 0630 04/04/12 0918  NA 143 143  K 3.1* 3.1*  CL 108 107  CO2 32 27  GLUCOSE 68* 93  BUN 5* 6  CREATININE 0.71 0.71  CALCIUM 7.9* 8.1*   PT/INR No results found for this basename: LABPROT:2,INR:2 in the last 72 hours   Lab 04/03/12 0305 03/31/12 2039  AST 17 25  ALT 12 16  ALKPHOS 100 120*  BILITOT 0.6 0.4  PROT 5.3* 6.1  ALBUMIN 2.2* 2.8*     Lipase  No results found for this basename: lipase     Studies/Results: Dg Chest Port 1 View  04/06/2012  *RADIOLOGY REPORT*  Clinical Data: Shortness of breath  PORTABLE CHEST - 1 VIEW  Comparison: 04/01/2012  Findings: Moderate cardiomegaly.  The IJ central line has been removed.  Interval improvement in interstitial edema/infiltrates. Low lung volumes with some increase in patchy perihilar and infrahilar airspace opacities.  No effusion.  IMPRESSION:  1.  Improving interstitial edema/infiltrates.  Worsening perihilar opacities may simply represent atelectasis secondary to low lung volumes. 2.  Stable cardiomegaly   Original Report Authenticated  By: D. Wallace Going, MD     Medications:    . docusate sodium  100 mg Oral BID  . feeding supplement  1 Container Oral TID BM  . hyoscyamine  0.25 mg Sublingual Once  . iron polysaccharides  150 mg Oral Daily  . megestrol  40 mg Oral Daily  . oxyCODONE-acetaminophen  1 tablet Oral Once  . pantoprazole  40 mg Oral Q0600  . polyethylene glycol  17 g Oral BID  . sodium chloride  10 mL Intravenous Q12H    Assessment/Plan Large, very low, partially obstructing rectal cancer: oncology has seen patient, at current no urgent surgical intervention needed, need mass to be reduced in size before considering resection, diet as tolerated, will follow patient while in hospital and set up OP follow up appts.   Plan:  She will have adjuvant therapy, before resection.  We will see after oncology has reduced the size of the tumor.   LOS: 6 days    Koty Anctil 04/06/2012

## 2012-04-06 NOTE — Progress Notes (Signed)
PATIENT DETAILS Name: Shannon Terry Age: 72 y.o. Sex: female Date of Birth: 27-Jun-1940 Admit Date: 03/31/2012 Admitting Physician Rise Patience, MD [id number], MD  Subjective: Much better today-ambulating in the room with the help of the daughter. Finally passed urine last night. Has urinated multiple times overnight and this morning. Her appetite is much better . She looks a lot better as well.  Assessment/Plan: Principal Problem:  *Iron deficiency anemia due to chronic blood loss - Secondary to rectal cancer - Status post 6 units of PRBC transfusion this admission - Received IV iron - Now on oral iron - Hemoglobin stable posttransfusion and holding.  Active Problems: Rectal cancer - Seen on colonoscopy on 1/29-partially obstructing - Her family has had daily bowel movements - Due for a rectal ultrasound this coming Monday - Radiation oncology to see patient as outpatient - Oncology will followup as outpatient - Surgery will follow as outpatient - Have educated the patient's daughter who is an Therapist, sports of the need to keep stool soft and about the importance of being on a bowel regimen  Anasarca - Secondary to moderate protein calorie malnutrition-in the context of chronic disease/malignancy - She does not have the best of appetite yet-her appetite waxes and wanes - Have instructed the daughter to use Lasix as needed-if she puts on 3-4 pounds rapidly  Protein calorie malnutrition - Moderate - Continue supplements   Leukocytosis - Unknown etiology-suspect inflammation from the tumor to be the culprit here - Urine culture negative - Afebrile and nontoxic looking- intact she looks a whole lot better today than the past few days. - Since afebrile and clinically improving-no further workup pursued this admission - Patient and Daughter instructed to seek medical attention right away if she were to become febrile  Urinary issue - Yesterday Foley catheter was removed, she  did not have urination over the day. She subsequently urinated later in the evening. - Change Lasix to as needed - Encouraged ambulation and oral intake - Since he is passing urine much better today-we'll not pursue further workup   Disposition: Discharge home today  DVT Prophylaxis: SCDs  Code Status: Full code   Procedures:  None  CONSULTS:  GI, hematology/oncology and general surgery  PHYSICAL EXAM: Vital signs in last 24 hours: Filed Vitals:   04/05/12 1100 04/05/12 1420 04/05/12 2122 04/06/12 0538  BP:  138/69 122/75 134/79  Pulse:  85 88 82  Temp:  98.3 F (36.8 C) 98.9 F (37.2 C) 97.9 F (36.6 C)  TempSrc:  Oral Oral Oral  Resp:  20 18 18   Height:      Weight:      SpO2: 94% 97% 97% 96%    Weight change:  Body mass index is 40.89 kg/(m^2).   Gen Exam: Awake and alert with clear speech.   Neck: Supple, No JVD.   Chest: B/L Clear.   CVS: S1 S2 Regular, no murmurs.  Abdomen: soft, BS +, non tender, non distended.  Extremities: 1+ edema, lower extremities warm to touch. Neurologic: Non Focal.   Skin: No Rash.   Wounds: N/A.    Intake/Output from previous day:  Intake/Output Summary (Last 24 hours) at 04/06/12 1010 Last data filed at 04/05/12 1855  Gross per 24 hour  Intake    120 ml  Output      0 ml  Net    120 ml     LAB RESULTS: CBC  Lab 04/05/12 0630 04/04/12 0918 04/03/12 0305 04/02/12 0820 04/01/12 2117 03/31/12 2039  WBC 16.8* 15.6* 14.9* 17.3* 13.9* --  HGB 9.1* 10.1* 9.8* 9.8* 9.3* --  HCT 30.5* 33.8* 32.2* 31.3* 29.5* --  PLT 400 382 431* 438* 420* --  MCV 72.8* 72.4* 70.2* 69.7* 69.2* --  MCH 21.7* 21.6* 21.4* 21.8* 21.8* --  MCHC 29.8* 29.9* 30.4 31.3 31.5 --  RDW NOT CALCULATED NOT CALCULATED NOT CALCULATED NOT CALCULATED NOT CALCULATED --  LYMPHSABS -- -- -- -- -- 1.3  MONOABS -- -- -- -- -- 1.8*  EOSABS -- -- -- -- -- 0.1  BASOSABS -- -- -- -- -- 0.1  BANDABS -- -- -- -- -- --    Chemistries   Lab 04/05/12 0630  04/04/12 0918 04/03/12 0305 04/01/12 0500 03/31/12 2039  NA 143 143 143 143 140  K 3.1* 3.1* 2.8* 3.8 4.1  CL 108 107 108 110 105  CO2 32 27 30 27 24   GLUCOSE 68* 93 102* 89 108*  BUN 5* 6 6 13 14   CREATININE 0.71 0.71 0.67 0.89 0.96  CALCIUM 7.9* 8.1* 8.0* 8.0* 8.2*  MG -- -- -- -- --    CBG:  Lab 04/02/12 0817 04/01/12 1601 04/01/12 1259 04/01/12 0740 04/01/12 0104  GLUCAP 83 101* 86 85 80    GFR Estimated Creatinine Clearance: 79.6 ml/min (by C-G formula based on Cr of 0.71).  Coagulation profile  Lab 04/01/12 2155  INR 1.47  PROTIME --    Cardiac Enzymes No results found for this basename: CK:3,CKMB:3,TROPONINI:3,MYOGLOBIN:3 in the last 168 hours  No components found with this basename: POCBNP:3 No results found for this basename: DDIMER:2 in the last 72 hours No results found for this basename: HGBA1C:2 in the last 72 hours No results found for this basename: CHOL:2,HDL:2,LDLCALC:2,TRIG:2,CHOLHDL:2,LDLDIRECT:2 in the last 72 hours No results found for this basename: TSH,T4TOTAL,FREET3,T3FREE,THYROIDAB in the last 72 hours No results found for this basename: VITAMINB12:2,FOLATE:2,FERRITIN:2,TIBC:2,IRON:2,RETICCTPCT:2 in the last 72 hours No results found for this basename: LIPASE:2,AMYLASE:2 in the last 72 hours  Urine Studies No results found for this basename: UACOL:2,UAPR:2,USPG:2,UPH:2,UTP:2,UGL:2,UKET:2,UBIL:2,UHGB:2,UNIT:2,UROB:2,ULEU:2,UEPI:2,UWBC:2,URBC:2,UBAC:2,CAST:2,CRYS:2,UCOM:2,BILUA:2 in the last 72 hours  MICROBIOLOGY: Recent Results (from the past 240 hour(s))  MRSA PCR SCREENING     Status: Normal   Collection Time   04/01/12  1:00 AM      Component Value Range Status Comment   MRSA by PCR NEGATIVE  NEGATIVE Final   URINE CULTURE     Status: Normal   Collection Time   04/01/12  1:00 AM      Component Value Range Status Comment   Specimen Description URINE, CATHETERIZED   Final    Special Requests NONE   Final    Culture  Setup Time 04/01/2012  01:40   Final    Colony Count NO GROWTH   Final    Culture NO GROWTH   Final    Report Status 04/01/2012 FINAL   Final     RADIOLOGY STUDIES/RESULTS: Ct Chest W Contrast  04/03/2012  *RADIOLOGY REPORT*  Clinical Data:  Rectal tumor, query metastatic disease.  Anemia.  CT CHEST, ABDOMEN AND PELVIS WITH CONTRAST  Technique:  Multidetector CT imaging of the chest, abdomen and pelvis was performed following the standard protocol during bolus administration of intravenous contrast.  Contrast: 31mL OMNIPAQUE IOHEXOL 300 MG/ML  SOLN  Comparison:  04/01/2012  CT CHEST  Findings:  Diffuse prominent subcutaneous edema noted with moderate bilateral pleural effusions and passive atelectasis.  Moderate cardiomegaly is present.  No pericardial effusion.  Small bilateral axillary lymph nodes are not pathologically  enlarged by size criteria.  No pathologic hilar or mediastinal adenopathy.  No definite enhancement along the pleural surfaces to specifically indicate an exudative effusion.  In the upper chest, the subcutaneous edema is especially concentrated anteriorly and along the breasts, whereas in the lower chest and abdomen the extensive subcutaneous edema is circumferential.  Aside from the passive atelectasis, the lungs appear relatively clear.  Despite efforts by the patient and technologist, motion artifact is present on some series of today's examination and could not be totally eliminated.  This reduces diagnostic sensitivity and specificity.  Technical factors related to patient body habitus also reduce diagnostic sensitivity and specificity.  Due to the large body habitus and dilutional effects of the relatively small contrast volume, it is difficult to observe the interventricular septum to tell if there is flattening or contour reversal to indicate elevated right heart pressures.  IMPRESSION:  1.  Cardiomegaly, with moderate bilateral pleural effusions and associated passive atelectasis, but without overt  pulmonary edema noted. 2.  Extensive subcutaneous edema, especially down in the abdomen and lower chest.  In the upper chest, the subcutaneous edema is particularly concentrated anteriorly along the breasts.  The appearance suggests third spacing of fluid possibly from elevated right heart pressures or more likely from the patient's known hypoproteinemia and hypoalbuminemia. 3.  We do not demonstrate compelling evidence of metastatic disease to the chest.  CT ABDOMEN AND PELVIS  Findings:  The liver, spleen, pancreas, and adrenal glands appear unremarkable.  The gallbladder appears contracted. The kidneys appear unremarkable, as do the proximal ureters.  A Foley catheter is present in the otherwise of the urinary bladder.  There is low-level mesenteric edema, but significantly less than the degree of subcutaneous edema present diffusely in the abdomen pelvis, although particularly anteriorly in the lower pelvis and thigh region.  The distal pancreatic node appears to measure 1.4 cm on image 58 of series 2.  Small mesenteric lymph nodes are present along with small retroperitoneal lymph nodes which are not pathologically enlarged by size criteria.  Similarly left external iliac lymph nodes and right external iliac nodes are not pathologically enlarged by size criteria.  No definite nodularity of the omentum or liver margins.  I do not observe a discrete mesenteric mass.  A large mixed density in the expected vicinity of the rectum measures 8.4 x 7.7 cm and appears to displace luminal contrast medium to the right posterior margin.  I expect that this represents the patient's rectal tumor.  Landmarks are obscured but involvement of the anus is quite likely.  There is some lobularity of this mass along the left perirectal region.  No definite nodularity or invasion in the ischiorectal fossa.  No abnormally enlarged perirectal lymph nodes are present.  I suspect that the fatty appearance along the right obturator canal  is due to fatty atrophy of the adductor musculature or although a lipoma could have a similar appearance.  There are bilateral chronic pars defects at L5 but with only about 2 mm of anterolisthesis at L5-S1.  Uterus is absent.  Sigmoid diverticulosis noted.  IMPRESSION:  1.  Large rectal mass may extend to or mildly beyond the anus.  The only enlarged lymph node the abdomen and pelvis is in the peripancreatic region, and could possibly be reactive. 2.  No compelling regions of metastatic disease identified. 3.  Extensive subcutaneous edema in the abdomen and pelvis, possibly due to the patient's hypoproteinemia and hypoalbuminemia. 4.  Sigmoid diverticulosis. 5.  Bilateral pars defects  at L5.   Original Report Authenticated By: Van Clines, M.D.    Ct Abdomen Pelvis W Contrast  04/03/2012  *RADIOLOGY REPORT*  Clinical Data:  Rectal tumor, query metastatic disease.  Anemia.  CT CHEST, ABDOMEN AND PELVIS WITH CONTRAST  Technique:  Multidetector CT imaging of the chest, abdomen and pelvis was performed following the standard protocol during bolus administration of intravenous contrast.  Contrast: 23mL OMNIPAQUE IOHEXOL 300 MG/ML  SOLN  Comparison:  04/01/2012  CT CHEST  Findings:  Diffuse prominent subcutaneous edema noted with moderate bilateral pleural effusions and passive atelectasis.  Moderate cardiomegaly is present.  No pericardial effusion.  Small bilateral axillary lymph nodes are not pathologically enlarged by size criteria.  No pathologic hilar or mediastinal adenopathy.  No definite enhancement along the pleural surfaces to specifically indicate an exudative effusion.  In the upper chest, the subcutaneous edema is especially concentrated anteriorly and along the breasts, whereas in the lower chest and abdomen the extensive subcutaneous edema is circumferential.  Aside from the passive atelectasis, the lungs appear relatively clear.  Despite efforts by the patient and technologist, motion artifact  is present on some series of today's examination and could not be totally eliminated.  This reduces diagnostic sensitivity and specificity.  Technical factors related to patient body habitus also reduce diagnostic sensitivity and specificity.  Due to the large body habitus and dilutional effects of the relatively small contrast volume, it is difficult to observe the interventricular septum to tell if there is flattening or contour reversal to indicate elevated right heart pressures.  IMPRESSION:  1.  Cardiomegaly, with moderate bilateral pleural effusions and associated passive atelectasis, but without overt pulmonary edema noted. 2.  Extensive subcutaneous edema, especially down in the abdomen and lower chest.  In the upper chest, the subcutaneous edema is particularly concentrated anteriorly along the breasts.  The appearance suggests third spacing of fluid possibly from elevated right heart pressures or more likely from the patient's known hypoproteinemia and hypoalbuminemia. 3.  We do not demonstrate compelling evidence of metastatic disease to the chest.  CT ABDOMEN AND PELVIS  Findings:  The liver, spleen, pancreas, and adrenal glands appear unremarkable.  The gallbladder appears contracted. The kidneys appear unremarkable, as do the proximal ureters.  A Foley catheter is present in the otherwise of the urinary bladder.  There is low-level mesenteric edema, but significantly less than the degree of subcutaneous edema present diffusely in the abdomen pelvis, although particularly anteriorly in the lower pelvis and thigh region.  The distal pancreatic node appears to measure 1.4 cm on image 58 of series 2.  Small mesenteric lymph nodes are present along with small retroperitoneal lymph nodes which are not pathologically enlarged by size criteria.  Similarly left external iliac lymph nodes and right external iliac nodes are not pathologically enlarged by size criteria.  No definite nodularity of the omentum or  liver margins.  I do not observe a discrete mesenteric mass.  A large mixed density in the expected vicinity of the rectum measures 8.4 x 7.7 cm and appears to displace luminal contrast medium to the right posterior margin.  I expect that this represents the patient's rectal tumor.  Landmarks are obscured but involvement of the anus is quite likely.  There is some lobularity of this mass along the left perirectal region.  No definite nodularity or invasion in the ischiorectal fossa.  No abnormally enlarged perirectal lymph nodes are present.  I suspect that the fatty appearance along the right obturator canal  is due to fatty atrophy of the adductor musculature or although a lipoma could have a similar appearance.  There are bilateral chronic pars defects at L5 but with only about 2 mm of anterolisthesis at L5-S1.  Uterus is absent.  Sigmoid diverticulosis noted.  IMPRESSION:  1.  Large rectal mass may extend to or mildly beyond the anus.  The only enlarged lymph node the abdomen and pelvis is in the peripancreatic region, and could possibly be reactive. 2.  No compelling regions of metastatic disease identified. 3.  Extensive subcutaneous edema in the abdomen and pelvis, possibly due to the patient's hypoproteinemia and hypoalbuminemia. 4.  Sigmoid diverticulosis. 5.  Bilateral pars defects at L5.   Original Report Authenticated By: Van Clines, M.D.    Dg Chest Port 1 View  04/06/2012  *RADIOLOGY REPORT*  Clinical Data: Shortness of breath  PORTABLE CHEST - 1 VIEW  Comparison: 04/01/2012  Findings: Moderate cardiomegaly.  The IJ central line has been removed.  Interval improvement in interstitial edema/infiltrates. Low lung volumes with some increase in patchy perihilar and infrahilar airspace opacities.  No effusion.  IMPRESSION:  1.  Improving interstitial edema/infiltrates.  Worsening perihilar opacities may simply represent atelectasis secondary to low lung volumes. 2.  Stable cardiomegaly   Original  Report Authenticated By: D. Wallace Going, MD    Dg Chest Port 1 View  04/01/2012  *RADIOLOGY REPORT*  Clinical Data: Line placement  PORTABLE CHEST - 1 VIEW  Comparison: 03/31/2012  Findings: Right IJ catheter placement in the interval with tip projecting over the proximal SVC.  No pneumothorax.  Cardiomegaly. Central vascular congestion.  Mild interstitial and airspace opacities. There may be trace effusions.  Multilevel degenerative changes.  IMPRESSION: Right IJ catheter placed with tip projecting over the proximal SVC. No pneumothorax.  Cardiomegaly with central vascular congestion and mild edema pattern, similar to prior.   Original Report Authenticated By: Carlos Levering, M.D.    Dg Chest Port 1 View  03/31/2012  *RADIOLOGY REPORT*  Clinical Data: Shortness of breath.  PORTABLE CHEST - 1 VIEW  Comparison: None.  Findings: There is marked enlargement of the cardiopericardial silhouette with vascular congestion.  No consolidative process, pneumothorax or effusion is identified.  Study is limited by the patient's body habitus and portable technique.  IMPRESSION: Marked enlargement of the cardiopericardial silhouette compatible with cardiomegaly and/or pericardial effusion.  Pulmonary vascular congestion without frank edema.   Original Report Authenticated By: Orlean Patten, M.D.     MEDICATIONS: Scheduled Meds:   . docusate sodium  100 mg Oral BID  . feeding supplement  1 Container Oral TID BM  . hyoscyamine  0.25 mg Sublingual Once  . iron polysaccharides  150 mg Oral Daily  . megestrol  40 mg Oral Daily  . oxyCODONE-acetaminophen  1 tablet Oral Once  . pantoprazole  40 mg Oral Q0600  . polyethylene glycol  17 g Oral BID  . sodium chloride  10 mL Intravenous Q12H   Continuous Infusions:  PRN Meds:.acetaminophen, acetaminophen, ondansetron (ZOFRAN) IV, ondansetron, sodium chloride  Antibiotics: Anti-infectives    None       Oren Binet, MD  [facility] Pager:[phone]  If 7PM-7AM, please contact night-coverage [email or web address] Password [email or web address] 04/06/2012, 10:10 AM   LOS: 6 days

## 2012-04-06 NOTE — Progress Notes (Signed)
General surgery attending note:  Patient is in the bathroom and I was unable to interviewed and examined her.I did spend a long time talking with her daughter. I emphasized The importance of prompt followup with surgery within 2 weeks of completion of radiation therapy.I explained the rationale for this, and the daughter seems to understand.   Edsel Petrin. Dalbert Batman, M.D., [facility] General and Minimally invasive Surgery Breast and Colorectal Surgery Office:   [phone] Pager:   [phone]

## 2012-04-06 NOTE — Progress Notes (Addendum)
Bladder scan showed that pt had about 200cc in her bladder. Pt's daughter concerned about pt maybe getting flomax. RN paged MD on call awaiting further orders. RN stopped IV fluids. RN spoke with MD on call who would relay the information to the primary physician in the morning.

## 2012-04-08 ENCOUNTER — Encounter (HOSPITAL_COMMUNITY)
Admission: RE | Disposition: A | Discharge status: Home / Self Care | Payer: Self-pay | Source: Ambulatory Visit | Attending: Gastroenterology

## 2012-04-08 ENCOUNTER — Encounter (HOSPITAL_COMMUNITY): Payer: Self-pay | Admitting: Gastroenterology

## 2012-04-08 ENCOUNTER — Ambulatory Visit (HOSPITAL_COMMUNITY)
Admission: RE | Admit: 2012-04-08 | Discharge: 2012-04-08 | Disposition: A | Discharge status: Home / Self Care | Payer: Medicare Other | Source: Ambulatory Visit | Attending: Gastroenterology | Admitting: Gastroenterology

## 2012-04-08 DIAGNOSIS — D481 Neoplasm of uncertain behavior of connective and other soft tissue: Secondary | ICD-10-CM | POA: Insufficient documentation

## 2012-04-08 DIAGNOSIS — K6289 Other specified diseases of anus and rectum: Secondary | ICD-10-CM

## 2012-04-08 DIAGNOSIS — D4819 Other specified neoplasm of uncertain behavior of connective and other soft tissue: Secondary | ICD-10-CM | POA: Insufficient documentation

## 2012-04-08 DIAGNOSIS — R198 Other specified symptoms and signs involving the digestive system and abdomen: Secondary | ICD-10-CM | POA: Diagnosis not present

## 2012-04-08 DIAGNOSIS — K626 Ulcer of anus and rectum: Secondary | ICD-10-CM | POA: Insufficient documentation

## 2012-04-08 HISTORY — PX: EUS: SHX5427

## 2012-04-08 SURGERY — ULTRASOUND, LOWER GI TRACT, ENDOSCOPIC
Anesthesia: Moderate Sedation

## 2012-04-08 MED ORDER — MIDAZOLAM HCL 10 MG/2ML IJ SOLN
INTRAMUSCULAR | Status: DC | PRN
  Administered 2012-04-08 (×2): 2 mg via INTRAVENOUS

## 2012-04-08 MED ORDER — FENTANYL CITRATE 0.05 MG/ML IJ SOLN
INTRAMUSCULAR | Status: AC
  Filled 2012-04-08: qty 2

## 2012-04-08 MED ORDER — FENTANYL CITRATE 0.05 MG/ML IJ SOLN
INTRAMUSCULAR | Status: DC | PRN
  Administered 2012-04-08 (×2): 25 ug via INTRAVENOUS

## 2012-04-08 MED ORDER — SODIUM CHLORIDE 0.9 % IV SOLN
INTRAVENOUS | Status: DC
  Administered 2012-04-08: 500 mL via INTRAVENOUS

## 2012-04-08 MED ORDER — MIDAZOLAM HCL 10 MG/2ML IJ SOLN
INTRAMUSCULAR | Status: AC
  Filled 2012-04-08: qty 2

## 2012-04-08 NOTE — Interval H&P Note (Signed)
History and Physical Interval Note:  04/08/2012 12:32 PM  Shannon Terry  has presented today for surgery, with the diagnosis of Rectal mass [787.99]  The various methods of treatment have been discussed with the patient and family. After consideration of risks, benefits and other options for treatment, the patient has consented to  Procedure(s) (LRB) with comments: LOWER ENDOSCOPIC ULTRASOUND (EUS) (N/A) as a surgical intervention .  The patient's history has been reviewed, patient examined, no change in status, stable for surgery.  I have reviewed the patient's chart and labs.  Questions were answered to the patient's satisfaction.     Owens Loffler

## 2012-04-08 NOTE — H&P (View-Only) (Signed)
[facility] Daily Rounding Note 04/04/2012, 8:46 AM  SUBJECTIVE:       Some rectal bleeding overnight.  No nausea, no abdominal pain, no dizziness.  Slept well.   OBJECTIVE:         Vital signs in last 24 hours:    Temp:  [98 F (36.7 C)-98.8 F (37.1 C)] 98.8 F (37.1 C) (01/30 0625) Pulse Rate:  [80-86] 82  (01/30 0625) Resp:  [16-30] 20  (01/30 0625) BP: (123-162)/(64-94) 142/72 mmHg (01/30 0625) SpO2:  [89 %-99 %] 96 % (01/30 0625) Last BM Date: 04/03/12 General: looks well   Heart: RRR Chest: clear Abdomen: soft, NT, ND, positive BS  Extremities: no CCE.    Neuro/Psych:  Relaxed, not confused.   Intake/Output from previous day: 01/29 0701 - 01/30 0700 In: 759.3 [I.V.:459.3] Out: 450 [Urine:450]  Intake/Output this shift:    Lab Results:  Basename 04/03/12 0305 04/02/12 0820 04/01/12 2117  WBC 14.9* 17.3* 13.9*  HGB 9.8* 9.8* 9.3*  HCT 32.2* 31.3* 29.5*  PLT 431* 438* 420*   BMET  Basename 04/03/12 0305  NA 143  K 2.8*  CL 108  CO2 30  GLUCOSE 102*  BUN 6  CREATININE 0.67  CALCIUM 8.0*    Studies/Results: 04/03/2012   CT CHEST  Findings:  Diffuse prominent subcutaneous edema noted with moderate bilateral pleural effusions and passive atelectasis.  Moderate cardiomegaly is present.  No pericardial effusion.  Small bilateral axillary lymph nodes are not pathologically enlarged by size criteria.  No pathologic hilar or mediastinal adenopathy.  No definite enhancement along the pleural surfaces to specifically indicate an exudative effusion.  In the upper chest, the subcutaneous edema is especially concentrated anteriorly and along the breasts, whereas in the lower chest and abdomen the extensive subcutaneous edema is circumferential.  Aside from the passive atelectasis, the lungs appear relatively clear.  Despite efforts by the patient and technologist, motion artifact is present on some series of today's examination and could not be totally eliminated.  This  reduces diagnostic sensitivity and specificity.  Technical factors related to patient body habitus also reduce diagnostic sensitivity and specificity.  Due to the large body habitus and dilutional effects of the relatively small contrast volume, it is difficult to observe the interventricular septum to tell if there is flattening or contour reversal to indicate elevated right heart pressures.  IMPRESSION:  1.  Cardiomegaly, with moderate bilateral pleural effusions and associated passive atelectasis, but without overt pulmonary edema noted. 2.  Extensive subcutaneous edema, especially down in the abdomen and lower chest.  In the upper chest, the subcutaneous edema is particularly concentrated anteriorly along the breasts.  The appearance suggests third spacing of fluid possibly from elevated right heart pressures or more likely from the patient's known hypoproteinemia and hypoalbuminemia. 3.  We do not demonstrate compelling evidence of metastatic disease to the chest.   CT ABDOMEN AND PELVIS  Findings:  The liver, spleen, pancreas, and adrenal glands appear unremarkable.  The gallbladder appears contracted. The kidneys appear unremarkable, as do the proximal ureters.  A Foley catheter is present in the otherwise of the urinary bladder.  There is low-level mesenteric edema, but significantly less than the degree of subcutaneous edema present diffusely in the abdomen pelvis, although particularly anteriorly in the lower pelvis and thigh region.  The distal pancreatic node appears to measure 1.4 cm on image 58 of series 2.  Small mesenteric lymph nodes are present along with small retroperitoneal lymph nodes  which are not pathologically enlarged by size criteria.  Similarly left external iliac lymph nodes and right external iliac nodes are not pathologically enlarged by size criteria.  No definite nodularity of the omentum or liver margins.  I do not observe a discrete mesenteric mass.  A large mixed density in the  expected vicinity of the rectum measures 8.4 x 7.7 cm and appears to displace luminal contrast medium to the right posterior margin.  I expect that this represents the patient's rectal tumor.  Landmarks are obscured but involvement of the anus is quite likely.  There is some lobularity of this mass along the left perirectal region.  No definite nodularity or invasion in the ischiorectal fossa.  No abnormally enlarged perirectal lymph nodes are present.  I suspect that the fatty appearance along the right obturator canal is due to fatty atrophy of the adductor musculature or although a lipoma could have a similar appearance.  There are bilateral chronic pars defects at L5 but with only about 2 mm of anterolisthesis at L5-S1.  Uterus is absent.  Sigmoid diverticulosis noted.  IMPRESSION:  1.  Large rectal mass may extend to or mildly beyond the anus.  The only enlarged lymph node the abdomen and pelvis is in the peripancreatic region, and could possibly be reactive. 2.  No compelling regions of metastatic disease identified. 3.  Extensive subcutaneous edema in the abdomen and pelvis, possibly due to the patient's hypoproteinemia and hypoalbuminemia. 4.  Sigmoid diverticulosis. 5.  Bilateral pars defects at L5.   Original Report Authenticated By: Van Clines, M.D.     ASSESMENT: *  Rectal cancer. Presenting with chronic rectal bleeding and  Symptomatic Hgb of 2.9. Colonoscopy with Bx 1/29.  Awaiting pathology.  CT scan with no strong evidence of mets *  Chronic blood loss anemia.  S/p multiple PRBCs. Hgb stable. *  Cardiomegaly and  right heart failure. By CT and echo.    PLAN: *    The path on rectal mass not back yet, but this is certainly a rectal cancer. Oncology consult called, surgery PA has seen pt. *  GI will sign off.  Call for reinvolvement.  We are available PRN.   *  CBCs can be followed by oncology.  She may require future transfusions if rectal bleeding continues.     LOS: 4 days    Azucena Freed  04/04/2012, 8:46 AM Pager: [phone]

## 2012-04-08 NOTE — Op Note (Signed)
[facility] [place], [address]   ENDOSCOPIC ULTRASOUND PROCEDURE REPORT  PATIENT: Shannon Terry, Shannon Terry  MR#: [id number] BIRTHDATE: [birth year]  GENDER: Female ENDOSCOPIST: Milus Banister, MD REFERRED BY:  Zenovia Jarred, MD PROCEDURE DATE:  04/08/2012 PROCEDURE:   Lower EUS ASA CLASS:      Class III INDICATIONS:   large rectal mass, no clear metastatic disease on CT last week. MEDICATIONS: Fentanyl 50 mcg IV and Versed 4 mg IV  DESCRIPTION OF PROCEDURE:   After the risks benefits and alternatives of the procedure were  explained, informed consent was obtained. The patient was then placed in the left, lateral, decubitus postion and IV sedation was administered. Throughout the procedure, the patients blood pressure, pulse and oxygen saturations were monitored continuously.  Under direct visualization, the Pentax EUS Radial [id number]  endoscope was introduced through the anus  and advanced to the sigmoid colon . Water was used as necessary to provide an acoustic interface.  Upon completion of the imaging, water was removed and the patient was sent to the recovery room in satisfactory condition.   Sigmoidoscopic findings: 1. Nearly circumferential, 10-12cm long, ulcerated mass in rectum, distal edge easily palpable at dentate line.  EUS findings: 1. The mass above corresponded with an 8-9cm process that was predominatly subepithelial mass that contained solid, hypoechoic tissue as well as ares of clear necrosis. This communicates with the muscularis propria layer of the rectal wall. 2. No perirectal adenopathy.  Impression: 10-12cm predominantly subepithelial mass that has ulcerated into the rectum. The distal edges directly abuts in the anal sphincter.  I spoke with Dr. 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 EUS imaging agrees with that diagnosis.  Her case is to be discussed at this weeks GI multidisciplinary  conference and she is already scheduled to meet with Dr. Marin Olp and Dr. Lisbeth Renshaw.  She met Dr. Fanny Skates last wweek in hospital. She may benefit from neoadjuvant approach (with Gleevec).    _______________________________ eSignedMilus Banister, MD 04/08/2012 1:31 PM

## 2012-04-09 ENCOUNTER — Telehealth: Payer: Self-pay | Admitting: Hematology & Oncology

## 2012-04-09 ENCOUNTER — Encounter (HOSPITAL_COMMUNITY): Payer: Self-pay | Admitting: Gastroenterology

## 2012-04-09 DIAGNOSIS — R609 Edema, unspecified: Secondary | ICD-10-CM | POA: Diagnosis not present

## 2012-04-09 DIAGNOSIS — D490 Neoplasm of unspecified behavior of digestive system: Secondary | ICD-10-CM | POA: Diagnosis not present

## 2012-04-09 DIAGNOSIS — D649 Anemia, unspecified: Secondary | ICD-10-CM | POA: Diagnosis not present

## 2012-04-09 NOTE — Telephone Encounter (Signed)
Pt called made 04-17-12 hospital follow up

## 2012-04-10 ENCOUNTER — Encounter: Payer: Self-pay | Admitting: Radiation Oncology

## 2012-04-10 ENCOUNTER — Telehealth: Payer: Self-pay | Admitting: *Deleted

## 2012-04-10 ENCOUNTER — Other Ambulatory Visit: Payer: Self-pay | Admitting: *Deleted

## 2012-04-10 DIAGNOSIS — D500 Iron deficiency anemia secondary to blood loss (chronic): Secondary | ICD-10-CM | POA: Diagnosis not present

## 2012-04-10 DIAGNOSIS — K219 Gastro-esophageal reflux disease without esophagitis: Secondary | ICD-10-CM | POA: Insufficient documentation

## 2012-04-10 DIAGNOSIS — R5381 Other malaise: Secondary | ICD-10-CM | POA: Diagnosis not present

## 2012-04-10 DIAGNOSIS — E669 Obesity, unspecified: Secondary | ICD-10-CM | POA: Insufficient documentation

## 2012-04-10 DIAGNOSIS — I080 Rheumatic disorders of both mitral and aortic valves: Secondary | ICD-10-CM | POA: Diagnosis not present

## 2012-04-10 DIAGNOSIS — K922 Gastrointestinal hemorrhage, unspecified: Secondary | ICD-10-CM | POA: Insufficient documentation

## 2012-04-10 DIAGNOSIS — D509 Iron deficiency anemia, unspecified: Secondary | ICD-10-CM | POA: Insufficient documentation

## 2012-04-10 DIAGNOSIS — R601 Generalized edema: Secondary | ICD-10-CM | POA: Insufficient documentation

## 2012-04-10 DIAGNOSIS — R609 Edema, unspecified: Secondary | ICD-10-CM | POA: Diagnosis not present

## 2012-04-10 DIAGNOSIS — D649 Anemia, unspecified: Secondary | ICD-10-CM | POA: Insufficient documentation

## 2012-04-10 DIAGNOSIS — K5731 Diverticulosis of large intestine without perforation or abscess with bleeding: Secondary | ICD-10-CM | POA: Diagnosis not present

## 2012-04-10 DIAGNOSIS — R198 Other specified symptoms and signs involving the digestive system and abdomen: Secondary | ICD-10-CM | POA: Diagnosis not present

## 2012-04-10 MED ORDER — ESOMEPRAZOLE MAGNESIUM 40 MG PO CPDR: DELAYED_RELEASE_CAPSULE | ORAL | Status: DC

## 2012-04-10 NOTE — Telephone Encounter (Signed)
Message copied by Lance Morin on Wed Apr 10, 2012  3:07 PM ------      Message from: Jerene Bears      Created: Tue Apr 09, 2012  3:42 PM       Patient's biopsies in the stomach were positive for H. pylori. Please treat with Pylera and BID PPI      Rectal tumor is a specialized tumor called GIST.  This will be discussed further at her appt this week with Dr. Marin Olp

## 2012-04-10 NOTE — Telephone Encounter (Signed)
Pt was resting so I spoke with her daughter in. Pt has no insurance except for medicare and they have been trying to pay for meds as they go. Informed her pt was + for H. Pylori and she needs to be tx with antibiotics; told her it is nothing serious, nbut if left untreated it can cause ulcers. Spoke with the drug rep, Mitzi Hansen and he will provide samples for the pt. Informed daughter Webb Silversmith and I will call her when the samples come in.

## 2012-04-11 ENCOUNTER — Ambulatory Visit
Admit: 2012-04-11 | Discharge: 2012-04-11 | Disposition: A | Discharge status: Home / Self Care | Payer: Medicare Other | Attending: Radiation Oncology | Admitting: Radiation Oncology

## 2012-04-11 ENCOUNTER — Encounter: Payer: Self-pay | Admitting: Radiation Oncology

## 2012-04-11 ENCOUNTER — Telehealth: Payer: Self-pay | Admitting: Radiation Oncology

## 2012-04-11 VITALS — BP 154/80 | HR 90 | Temp 98.3°F | Wt 221.7 lb

## 2012-04-11 DIAGNOSIS — Z79899 Other long term (current) drug therapy: Secondary | ICD-10-CM | POA: Insufficient documentation

## 2012-04-11 DIAGNOSIS — I080 Rheumatic disorders of both mitral and aortic valves: Secondary | ICD-10-CM | POA: Diagnosis not present

## 2012-04-11 DIAGNOSIS — K219 Gastro-esophageal reflux disease without esophagitis: Secondary | ICD-10-CM | POA: Insufficient documentation

## 2012-04-11 DIAGNOSIS — R5383 Other fatigue: Secondary | ICD-10-CM | POA: Diagnosis not present

## 2012-04-11 DIAGNOSIS — C494 Malignant neoplasm of connective and soft tissue of abdomen: Secondary | ICD-10-CM | POA: Insufficient documentation

## 2012-04-11 DIAGNOSIS — C20 Malignant neoplasm of rectum: Secondary | ICD-10-CM | POA: Diagnosis not present

## 2012-04-11 DIAGNOSIS — R198 Other specified symptoms and signs involving the digestive system and abdomen: Secondary | ICD-10-CM | POA: Diagnosis not present

## 2012-04-11 DIAGNOSIS — D500 Iron deficiency anemia secondary to blood loss (chronic): Secondary | ICD-10-CM | POA: Diagnosis not present

## 2012-04-11 DIAGNOSIS — R609 Edema, unspecified: Secondary | ICD-10-CM | POA: Diagnosis not present

## 2012-04-11 DIAGNOSIS — K5731 Diverticulosis of large intestine without perforation or abscess with bleeding: Secondary | ICD-10-CM | POA: Diagnosis not present

## 2012-04-11 NOTE — Progress Notes (Signed)
Patient, daughter and husband here for consultation of rectal mass (GIST).For consideration of neoadjuvant therapy of radiation and xeloda.Patient sought treatment after not being able to get up from chair after which she had husband call EMS.

## 2012-04-11 NOTE — Progress Notes (Signed)
Please see the Nurse Progress Note in the MD Initial Consult Encounter for this patient. 

## 2012-04-11 NOTE — Telephone Encounter (Signed)
Met w pt's dau Shannon Terry) to discuss RO billing. Pt had some concerns in regards to chemo pill financial assistance. Pt advised that her meds are expensive and is going to apply for Medicare part D. I also told pt the med onc could possibly have a drug replacement program to assist with the meds expenses and a FC from med onc will talk w her about it.  I gave pt an EPP and MCD application to take to DSS, bring back to me or give to [place] on their next appt.  Dx: Rectal  Attending Rad:  JM  Rad Tx: None at this time.

## 2012-04-12 DIAGNOSIS — K5731 Diverticulosis of large intestine without perforation or abscess with bleeding: Secondary | ICD-10-CM | POA: Diagnosis not present

## 2012-04-12 DIAGNOSIS — D500 Iron deficiency anemia secondary to blood loss (chronic): Secondary | ICD-10-CM | POA: Diagnosis not present

## 2012-04-12 DIAGNOSIS — R609 Edema, unspecified: Secondary | ICD-10-CM | POA: Diagnosis not present

## 2012-04-12 DIAGNOSIS — R5381 Other malaise: Secondary | ICD-10-CM | POA: Diagnosis not present

## 2012-04-12 DIAGNOSIS — I080 Rheumatic disorders of both mitral and aortic valves: Secondary | ICD-10-CM | POA: Diagnosis not present

## 2012-04-12 DIAGNOSIS — R198 Other specified symptoms and signs involving the digestive system and abdomen: Secondary | ICD-10-CM | POA: Diagnosis not present

## 2012-04-12 MED ORDER — BIS SUBCIT-METRONID-TETRACYC 140-125-125 MG PO CAPS: 3.0000 | ORAL_CAPSULE | Freq: Three times a day (TID) | ORAL | Status: DC

## 2012-04-12 NOTE — Telephone Encounter (Signed)
Informed pt's granddaughter that we have the Pylera samples; they will pick them up and directions are in the bag.

## 2012-04-15 NOTE — Progress Notes (Signed)
Radiation Oncology         [phone] ________________________________  Name: Shannon Terry MRN: [id number]  Date: 04/11/2012  DOB: Apr 24, 1940  [id number], MD  Volanda Napoleon, MD     REFERRING PHYSICIAN: Volanda Napoleon, MD   DIAGNOSIS: The encounter diagnosis was Malignant neoplasm of rectum.: GIST tumor   HISTORY OF PRESENT ILLNESS::Shannon Terry is a 72 y.o. female who is seen for an initial consultation visit. The patient indicates that she has had some bleeding for quite some time, possibly up to about a year or more. She began experiencing some fatigue and some pelvic/rectal pain and then presented to the emergency room. Exam at that time revealed a large palpable rectal mass. The patient's hematocrit was extremely low at 2.9.  The patient's workup has included a a lower endoscopic ultrasound on 04/08/2012. This revealed a nearly circumferential 10-12 cm long ulcerated mass within the rectum. On ultrasound this contains solid and hypoechoic tissues as well as clearer areas of necrosis. The distal edge was easily palpable at the didn't take line. A biopsy was performed and this has returned positive for a gastrointestinal stromal tumor.  Patient's workup has also included a CT scan of the chest abdomen and pelvis. There is not any evidence for metastatic disease within the chest. A large rectal mass was present which was felt to extend to or mildly beyond the anus. There was one enlarged node within the abdomen and pelvis and this was in the [place] pancreatic region, felt to possibly be reactive. There were no compelling regions of metastatic disease identified.  The patient has been seen by Dr. Marin Olp and her case was also discussed at GI tumor Board this morning.   PREVIOUS RADIATION THERAPY: No   PAST MEDICAL HISTORY:  has a past medical history of Obesity; Iron deficiency anemia due to chronic blood loss (04/05/12); GERD (gastroesophageal reflux disease); Rectal cancer  (04/03/12); Anemia, iron deficiency; GI bleeding; Hemorrhoids; Acute renal failure; Gastritis without bleeding; Anasarca; Hematochezia; and History of blood transfusion (04/01/12).     PAST SURGICAL HISTORY: Past Surgical History  Procedure Laterality Date  . Abdominal hysterectomy      Partial  . Esophagogastroduodenoscopy  04/03/2012    Procedure: ESOPHAGOGASTRODUODENOSCOPY (EGD);  Surgeon: Jerene Bears, MD;  Location: [facility];  Service: Gastroenterology;  Laterality: N/A;  . Colonoscopy  04/03/2012    Procedure: COLONOSCOPY;  Surgeon: Jerene Bears, MD;  Location: [facility];  Service: Gastroenterology;  Laterality: N/A;  . Eus  04/08/2012    Procedure: LOWER ENDOSCOPIC ULTRASOUND (EUS);  Surgeon: Milus Banister, MD;  Location: [facility];  Service: Endoscopy;  Laterality: N/A;  . Biopsy stomach  04/03/12    neg for malignancy  . Rectal biopsy  04/03/12    gastrointestinal stromal tumor     FAMILY HISTORY: family history includes Diabetes Mellitus II in her father; Heart attack in her brother; Leukemia in her brother; and Pancreatic cancer in her sister.   SOCIAL HISTORY:  reports that she has never smoked. She does not have any smokeless tobacco history on file. She reports that she does not drink alcohol or use illicit drugs.   ALLERGIES: Review of patient's allergies indicates no known allergies.   MEDICATIONS:  Current Outpatient Prescriptions  Medication Sig Dispense Refill  . feeding supplement (ENSURE) PUDG Take 1 Container by mouth 3 (three) times daily between meals.  150 Container  5  . furosemide (LASIX) 40 MG tablet Take 0.5 tablets (20 mg total)  by mouth daily as needed.  30 tablet  0  . iron polysaccharides (NIFEREX) 150 MG capsule Take 1 capsule (150 mg total) by mouth daily.  30 capsule  2  . megestrol (MEGACE) 40 MG tablet Take 1 tablet (40 mg total) by mouth daily.  60 tablet  0  . ondansetron (ZOFRAN) 4 MG tablet Take 1 tablet (4 mg total) by mouth every 6  (six) hours as needed for nausea.  30 tablet  1  . oxyCODONE-acetaminophen (PERCOCET/ROXICET) 5-325 MG per tablet Take 1 tablet by mouth once.  30 tablet  0  . polyethylene glycol (MIRALAX / GLYCOLAX) packet Take 17 g by mouth 2 (two) times daily.  100 each  1  . potassium chloride (K-DUR) 10 MEQ tablet Take 1 tablet (10 mEq total) by mouth 2 (two) times daily.  60 tablet  0  . bismuth-metronidazole-tetracycline (PYLERA) 140-125-125 MG per capsule Take 3 capsules by mouth 4 (four) times daily -  before meals and at bedtime.  120 capsule  0  . esomeprazole (NEXIUM) 40 MG capsule Take one capsule by mouth twice daily while taking Pylera. 1st dose should be 30 minutes prior to breakfast.  20 capsule  0   No current facility-administered medications for this encounter.     REVIEW OF SYSTEMS:  A 15 point review of systems is documented in the electronic medical record. This was obtained by the nursing staff. However, I reviewed this with the patient to discuss relevant findings and make appropriate changes.  Pertinent items are noted in HPI.    PHYSICAL EXAM:  weight is 221 lb 11.2 oz (100.562 kg). Her temperature is 98.3 F (36.8 C). Her blood pressure is 154/80 and her pulse is 90. Her oxygen saturation is 100%.   General: Well-developed, in no acute distress HEENT: Normocephalic, atraumatic; oral cavity clear Neck: Supple without any lymphadenopathy Cardiovascular: Regular rate and rhythm Respiratory: Clear to auscultation bilaterally GI: Soft, nontender, normal bowel sounds Extremities: No edema present Neuro: No focal deficits Rectal: deferred - no xrt planned    LABORATORY DATA:  Lab Results  Component Value Date   WBC 17.4* 04/06/2012   HGB 10.0* 04/06/2012   HCT 33.3* 04/06/2012   MCV 73.8* 04/06/2012   PLT PLATELET CLUMPS NOTED ON SMEAR, COUNT APPEARS ADEQUATE 04/06/2012   Lab Results  Component Value Date   NA 142 04/06/2012   K 3.6 04/06/2012   CL 106 04/06/2012   CO2 30 04/06/2012    Lab Results  Component Value Date   ALT 12 04/03/2012   AST 17 04/03/2012   ALKPHOS 100 04/03/2012   BILITOT 0.6 04/03/2012      RADIOGRAPHY: Ct Chest W Contrast  04/03/2012  *RADIOLOGY REPORT*  Clinical Data:  Rectal tumor, query metastatic disease.  Anemia.  CT CHEST, ABDOMEN AND PELVIS WITH CONTRAST  Technique:  Multidetector CT imaging of the chest, abdomen and pelvis was performed following the standard protocol during bolus administration of intravenous contrast.  Contrast: 85mL OMNIPAQUE IOHEXOL 300 MG/ML  SOLN  Comparison:  04/01/2012  CT CHEST  Findings:  Diffuse prominent subcutaneous edema noted with moderate bilateral pleural effusions and passive atelectasis.  Moderate cardiomegaly is present.  No pericardial effusion.  Small bilateral axillary lymph nodes are not pathologically enlarged by size criteria.  No pathologic hilar or mediastinal adenopathy.  No definite enhancement along the pleural surfaces to specifically indicate an exudative effusion.  In the upper chest, the subcutaneous edema is especially concentrated anteriorly and along  the breasts, whereas in the lower chest and abdomen the extensive subcutaneous edema is circumferential.  Aside from the passive atelectasis, the lungs appear relatively clear.  Despite efforts by the patient and technologist, motion artifact is present on some series of today's examination and could not be totally eliminated.  This reduces diagnostic sensitivity and specificity.  Technical factors related to patient body habitus also reduce diagnostic sensitivity and specificity.  Due to the large body habitus and dilutional effects of the relatively small contrast volume, it is difficult to observe the interventricular septum to tell if there is flattening or contour reversal to indicate elevated right heart pressures.  IMPRESSION:  1.  Cardiomegaly, with moderate bilateral pleural effusions and associated passive atelectasis, but without overt pulmonary  edema noted. 2.  Extensive subcutaneous edema, especially down in the abdomen and lower chest.  In the upper chest, the subcutaneous edema is particularly concentrated anteriorly along the breasts.  The appearance suggests third spacing of fluid possibly from elevated right heart pressures or more likely from the patient's known hypoproteinemia and hypoalbuminemia. 3.  We do not demonstrate compelling evidence of metastatic disease to the chest.  CT ABDOMEN AND PELVIS  Findings:  The liver, spleen, pancreas, and adrenal glands appear unremarkable.  The gallbladder appears contracted. The kidneys appear unremarkable, as do the proximal ureters.  A Foley catheter is present in the otherwise of the urinary bladder.  There is low-level mesenteric edema, but significantly less than the degree of subcutaneous edema present diffusely in the abdomen pelvis, although particularly anteriorly in the lower pelvis and thigh region.  The distal pancreatic node appears to measure 1.4 cm on image 58 of series 2.  Small mesenteric lymph nodes are present along with small retroperitoneal lymph nodes which are not pathologically enlarged by size criteria.  Similarly left external iliac lymph nodes and right external iliac nodes are not pathologically enlarged by size criteria.  No definite nodularity of the omentum or liver margins.  I do not observe a discrete mesenteric mass.  A large mixed density in the expected vicinity of the rectum measures 8.4 x 7.7 cm and appears to displace luminal contrast medium to the right posterior margin.  I expect that this represents the patient's rectal tumor.  Landmarks are obscured but involvement of the anus is quite likely.  There is some lobularity of this mass along the left perirectal region.  No definite nodularity or invasion in the ischiorectal fossa.  No abnormally enlarged perirectal lymph nodes are present.  I suspect that the fatty appearance along the right obturator canal is due to  fatty atrophy of the adductor musculature or although a lipoma could have a similar appearance.  There are bilateral chronic pars defects at L5 but with only about 2 mm of anterolisthesis at L5-S1.  Uterus is absent.  Sigmoid diverticulosis noted.  IMPRESSION:  1.  Large rectal mass may extend to or mildly beyond the anus.  The only enlarged lymph node the abdomen and pelvis is in the peripancreatic region, and could possibly be reactive. 2.  No compelling regions of metastatic disease identified. 3.  Extensive subcutaneous edema in the abdomen and pelvis, possibly due to the patient's hypoproteinemia and hypoalbuminemia. 4.  Sigmoid diverticulosis. 5.  Bilateral pars defects at L5.   Original Report Authenticated By: Van Clines, M.D.    Ct Abdomen Pelvis W Contrast  04/03/2012  *RADIOLOGY REPORT*  Clinical Data:  Rectal tumor, query metastatic disease.  Anemia.  CT CHEST, ABDOMEN  AND PELVIS WITH CONTRAST  Technique:  Multidetector CT imaging of the chest, abdomen and pelvis was performed following the standard protocol during bolus administration of intravenous contrast.  Contrast: 37mL OMNIPAQUE IOHEXOL 300 MG/ML  SOLN  Comparison:  04/01/2012  CT CHEST  Findings:  Diffuse prominent subcutaneous edema noted with moderate bilateral pleural effusions and passive atelectasis.  Moderate cardiomegaly is present.  No pericardial effusion.  Small bilateral axillary lymph nodes are not pathologically enlarged by size criteria.  No pathologic hilar or mediastinal adenopathy.  No definite enhancement along the pleural surfaces to specifically indicate an exudative effusion.  In the upper chest, the subcutaneous edema is especially concentrated anteriorly and along the breasts, whereas in the lower chest and abdomen the extensive subcutaneous edema is circumferential.  Aside from the passive atelectasis, the lungs appear relatively clear.  Despite efforts by the patient and technologist, motion artifact is present  on some series of today's examination and could not be totally eliminated.  This reduces diagnostic sensitivity and specificity.  Technical factors related to patient body habitus also reduce diagnostic sensitivity and specificity.  Due to the large body habitus and dilutional effects of the relatively small contrast volume, it is difficult to observe the interventricular septum to tell if there is flattening or contour reversal to indicate elevated right heart pressures.  IMPRESSION:  1.  Cardiomegaly, with moderate bilateral pleural effusions and associated passive atelectasis, but without overt pulmonary edema noted. 2.  Extensive subcutaneous edema, especially down in the abdomen and lower chest.  In the upper chest, the subcutaneous edema is particularly concentrated anteriorly along the breasts.  The appearance suggests third spacing of fluid possibly from elevated right heart pressures or more likely from the patient's known hypoproteinemia and hypoalbuminemia. 3.  We do not demonstrate compelling evidence of metastatic disease to the chest.  CT ABDOMEN AND PELVIS  Findings:  The liver, spleen, pancreas, and adrenal glands appear unremarkable.  The gallbladder appears contracted. The kidneys appear unremarkable, as do the proximal ureters.  A Foley catheter is present in the otherwise of the urinary bladder.  There is low-level mesenteric edema, but significantly less than the degree of subcutaneous edema present diffusely in the abdomen pelvis, although particularly anteriorly in the lower pelvis and thigh region.  The distal pancreatic node appears to measure 1.4 cm on image 58 of series 2.  Small mesenteric lymph nodes are present along with small retroperitoneal lymph nodes which are not pathologically enlarged by size criteria.  Similarly left external iliac lymph nodes and right external iliac nodes are not pathologically enlarged by size criteria.  No definite nodularity of the omentum or liver  margins.  I do not observe a discrete mesenteric mass.  A large mixed density in the expected vicinity of the rectum measures 8.4 x 7.7 cm and appears to displace luminal contrast medium to the right posterior margin.  I expect that this represents the patient's rectal tumor.  Landmarks are obscured but involvement of the anus is quite likely.  There is some lobularity of this mass along the left perirectal region.  No definite nodularity or invasion in the ischiorectal fossa.  No abnormally enlarged perirectal lymph nodes are present.  I suspect that the fatty appearance along the right obturator canal is due to fatty atrophy of the adductor musculature or although a lipoma could have a similar appearance.  There are bilateral chronic pars defects at L5 but with only about 2 mm of anterolisthesis at L5-S1.  Uterus  is absent.  Sigmoid diverticulosis noted.  IMPRESSION:  1.  Large rectal mass may extend to or mildly beyond the anus.  The only enlarged lymph node the abdomen and pelvis is in the peripancreatic region, and could possibly be reactive. 2.  No compelling regions of metastatic disease identified. 3.  Extensive subcutaneous edema in the abdomen and pelvis, possibly due to the patient's hypoproteinemia and hypoalbuminemia. 4.  Sigmoid diverticulosis. 5.  Bilateral pars defects at L5.   Original Report Authenticated By: Van Clines, M.D.    Dg Chest Port 1 View  04/06/2012  *RADIOLOGY REPORT*  Clinical Data: Shortness of breath  PORTABLE CHEST - 1 VIEW  Comparison: 04/01/2012  Findings: Moderate cardiomegaly.  The IJ central line has been removed.  Interval improvement in interstitial edema/infiltrates. Low lung volumes with some increase in patchy perihilar and infrahilar airspace opacities.  No effusion.  IMPRESSION:  1.  Improving interstitial edema/infiltrates.  Worsening perihilar opacities may simply represent atelectasis secondary to low lung volumes. 2.  Stable cardiomegaly   Original Report  Authenticated By: D. Wallace Going, MD    Dg Chest Port 1 View  04/01/2012  *RADIOLOGY REPORT*  Clinical Data: Line placement  PORTABLE CHEST - 1 VIEW  Comparison: 03/31/2012  Findings: Right IJ catheter placement in the interval with tip projecting over the proximal SVC.  No pneumothorax.  Cardiomegaly. Central vascular congestion.  Mild interstitial and airspace opacities. There may be trace effusions.  Multilevel degenerative changes.  IMPRESSION: Right IJ catheter placed with tip projecting over the proximal SVC. No pneumothorax.  Cardiomegaly with central vascular congestion and mild edema pattern, similar to prior.   Original Report Authenticated By: Carlos Levering, M.D.    Dg Chest Port 1 View  03/31/2012  *RADIOLOGY REPORT*  Clinical Data: Shortness of breath.  PORTABLE CHEST - 1 VIEW  Comparison: None.  Findings: There is marked enlargement of the cardiopericardial silhouette with vascular congestion.  No consolidative process, pneumothorax or effusion is identified.  Study is limited by the patient's body habitus and portable technique.  IMPRESSION: Marked enlargement of the cardiopericardial silhouette compatible with cardiomegaly and/or pericardial effusion.  Pulmonary vascular congestion without frank edema.   Original Report Authenticated By: Orlean Patten, M.D.        IMPRESSION: The patient is a pleasant 72 year old female with a recent diagnosis of a rectal gastrointestinal stromal tumor. This is an uncommon tumor at this location and the patient's care will therefore accordingly be individualized. It was discussed this morning at conference that the patient may benefit from preoperative Gleevec followed then by surgical resection. There is no role for radiotherapy at this time. Depending on the patient's response, Dr. Marin Olp will coordinate the timing of resection with surgery.   PLAN: There are no plans for radiotherapy in this patient's case. I discussed this with her including  the rationale of such a treatment. All of her questions were answered. She will followup on a when necessary basis. Hopefully her feet bleeding will be well-controlled once she begin systemic treatment. She is scheduled to see Dr. Karle Starch in the near future.        ________________________________   Jodelle Gross, MD, PhD

## 2012-04-16 DIAGNOSIS — D500 Iron deficiency anemia secondary to blood loss (chronic): Secondary | ICD-10-CM | POA: Diagnosis not present

## 2012-04-16 DIAGNOSIS — R198 Other specified symptoms and signs involving the digestive system and abdomen: Secondary | ICD-10-CM | POA: Diagnosis not present

## 2012-04-16 DIAGNOSIS — R609 Edema, unspecified: Secondary | ICD-10-CM | POA: Diagnosis not present

## 2012-04-16 DIAGNOSIS — I080 Rheumatic disorders of both mitral and aortic valves: Secondary | ICD-10-CM | POA: Diagnosis not present

## 2012-04-16 DIAGNOSIS — R5381 Other malaise: Secondary | ICD-10-CM | POA: Diagnosis not present

## 2012-04-16 DIAGNOSIS — K5731 Diverticulosis of large intestine without perforation or abscess with bleeding: Secondary | ICD-10-CM | POA: Diagnosis not present

## 2012-04-17 ENCOUNTER — Ambulatory Visit (HOSPITAL_BASED_OUTPATIENT_CLINIC_OR_DEPARTMENT_OTHER): Payer: Medicare Other | Admitting: Hematology & Oncology

## 2012-04-17 ENCOUNTER — Other Ambulatory Visit (HOSPITAL_BASED_OUTPATIENT_CLINIC_OR_DEPARTMENT_OTHER): Payer: Medicare Other | Admitting: Lab

## 2012-04-17 VITALS — BP 150/79 | HR 94 | Temp 99.1°F | Resp 18 | Ht 64.0 in | Wt 208.0 lb

## 2012-04-17 DIAGNOSIS — R198 Other specified symptoms and signs involving the digestive system and abdomen: Secondary | ICD-10-CM | POA: Diagnosis not present

## 2012-04-17 DIAGNOSIS — R609 Edema, unspecified: Secondary | ICD-10-CM | POA: Diagnosis not present

## 2012-04-17 DIAGNOSIS — C494 Malignant neoplasm of connective and soft tissue of abdomen: Secondary | ICD-10-CM

## 2012-04-17 DIAGNOSIS — D500 Iron deficiency anemia secondary to blood loss (chronic): Secondary | ICD-10-CM | POA: Diagnosis not present

## 2012-04-17 DIAGNOSIS — R5381 Other malaise: Secondary | ICD-10-CM | POA: Diagnosis not present

## 2012-04-17 DIAGNOSIS — R5383 Other fatigue: Secondary | ICD-10-CM | POA: Diagnosis not present

## 2012-04-17 DIAGNOSIS — C49A0 Gastrointestinal stromal tumor, unspecified site: Secondary | ICD-10-CM

## 2012-04-17 DIAGNOSIS — K5731 Diverticulosis of large intestine without perforation or abscess with bleeding: Secondary | ICD-10-CM | POA: Diagnosis not present

## 2012-04-17 DIAGNOSIS — I080 Rheumatic disorders of both mitral and aortic valves: Secondary | ICD-10-CM | POA: Diagnosis not present

## 2012-04-17 LAB — CBC WITH DIFFERENTIAL (CANCER CENTER ONLY)
BASO%: 0.9 % (ref 0.0–2.0)
EOS%: 1.6 % (ref 0.0–7.0)
MCH: 23 pg — ABNORMAL LOW (ref 26.0–34.0)
MCV: 76 fL — ABNORMAL LOW (ref 81–101)
MONO%: 14.1 % — ABNORMAL HIGH (ref 0.0–13.0)
NEUT#: 6.4 10*3/uL (ref 1.5–6.5)
Platelets: 395 10*3/uL (ref 145–400)

## 2012-04-17 LAB — PREALBUMIN: Prealbumin: 10 mg/dL — ABNORMAL LOW (ref 17.0–34.0)

## 2012-04-17 LAB — COMPREHENSIVE METABOLIC PANEL
Albumin: 2.9 g/dL — ABNORMAL LOW (ref 3.5–5.2)
Alkaline Phosphatase: 84 U/L (ref 39–117)
BUN: 6 mg/dL (ref 6–23)
Creatinine, Ser: 0.71 mg/dL (ref 0.50–1.10)
Glucose, Bld: 91 mg/dL (ref 70–99)
Total Bilirubin: 0.7 mg/dL (ref 0.3–1.2)

## 2012-04-17 LAB — IRON AND TIBC
Iron: 55 ug/dL (ref 42–145)
UIBC: 128 ug/dL (ref 125–400)

## 2012-04-17 NOTE — Progress Notes (Signed)
This office note has been dictated.

## 2012-04-17 NOTE — Progress Notes (Signed)
CC:   Dibas Koirala, MD Zenovia Jarred, MD  DIAGNOSIS:  Rectal gastrointestinal stromal tumor.  CURRENT THERAPY:  The patient to start [place] 400 mg p.o. daily.  INTERIM HISTORY:  Ms. Wiatr comes in for her first office visit.  I saw her in consultation back on January 30th.  At that point in time, she came in with fatigue and rectal pain.  Her hemoglobin was 2.9.  She had a large rectal mass.  She underwent a biopsy.  Shockingly enough, the pathology results ([id number]) showed a gastrointestinal stromal tumor.  Staging studies have not shown any obvious metastasis.  She had a CT of the chest, abdomen and pelvis.  She did not have any evidence of a pulmonary metastasis.  She had cardiomegaly with evidence of high output heart failure.  Again, she had this rectal mass measuring 8.4 x 7.7 cm.  She is taking oral iron right now.  She is having a little bit of rectal bleeding.  There is really not much in the way of pain.  When I saw her in the hospital, she vowed that she would never have a "bag" put on her.  PHYSICAL EXAMINATION:  This is an elderly appearing black female in no obvious distress.  Vital signs:  Temperature of 99.1, pulse 94, respiratory rate 18, blood pressure 150/79.  Weight is 208.  Head/Neck: Normocephalic, atraumatic skull.  There is no scleral icterus.  There is no adenopathy in the neck.  Her lungs are clear bilaterally.  Cardiac: Regular rate and rhythm with a normal S1, S2.  She has a 1/6 systolic ejection murmur.  Abdomen:  Soft with good bowel sounds.  There is no fluid wave.  There is no palpable abdominal mass.  There is no palpable hepatosplenomegaly.  Extremities:  No clubbing, cyanosis or edema. There may be some slight 1+ edema in her legs.  Neurological:  No focal neurological deficits.  LABORATORY STUDIES:  White cell count is 9, hemoglobin 11.4, hematocrit 37.9, platelet count 395.  MCV is 76.  Pre-albumin is 10.  Her calcium is 8.1 with an  albumin of 2.9.  IMPRESSION:  Ms. Wormwood is a very nice 72 year old African American female with a rectal gastrointestinal stromal tumor.  This certainly is a very unusual presentation for GIST tumor. We will get her on Gleevec.  She has no insurance, so we are going to have to work on getting [place] for her.  I think she could tolerate 400 mg a day.  I will call the pathologist to see if the tumor can be sent off for genetic studies to see if she has any mutations with the KIT gene.  Some studies have shown that mutations in [employer] 9 may require a higher dose of Gleevec.  I told Ms. Buckingham and her family that the [place] should work but that it will take several months before we see a significant response.  We will plan to get her back in a couple weeks once I know when she is going to start the [place].  I will need to make sure that we make her appointment after we get her started on [place].  I spent a good 45 minutes or so with Ms. Goyette and her family.  They are all very, very nice.    ______________________________ Volanda Napoleon, M.D. PRE/MEDQ  D:  04/17/2012  T:  04/17/2012  Job:  [id number]

## 2012-04-19 DIAGNOSIS — R198 Other specified symptoms and signs involving the digestive system and abdomen: Secondary | ICD-10-CM | POA: Diagnosis not present

## 2012-04-19 DIAGNOSIS — R5383 Other fatigue: Secondary | ICD-10-CM | POA: Diagnosis not present

## 2012-04-19 DIAGNOSIS — K5731 Diverticulosis of large intestine without perforation or abscess with bleeding: Secondary | ICD-10-CM | POA: Diagnosis not present

## 2012-04-19 DIAGNOSIS — I080 Rheumatic disorders of both mitral and aortic valves: Secondary | ICD-10-CM | POA: Diagnosis not present

## 2012-04-19 DIAGNOSIS — R609 Edema, unspecified: Secondary | ICD-10-CM | POA: Diagnosis not present

## 2012-04-19 DIAGNOSIS — D500 Iron deficiency anemia secondary to blood loss (chronic): Secondary | ICD-10-CM | POA: Diagnosis not present

## 2012-04-22 ENCOUNTER — Other Ambulatory Visit: Payer: Self-pay | Admitting: *Deleted

## 2012-04-22 DIAGNOSIS — R5381 Other malaise: Secondary | ICD-10-CM | POA: Diagnosis not present

## 2012-04-22 DIAGNOSIS — I080 Rheumatic disorders of both mitral and aortic valves: Secondary | ICD-10-CM | POA: Diagnosis not present

## 2012-04-22 DIAGNOSIS — R609 Edema, unspecified: Secondary | ICD-10-CM | POA: Diagnosis not present

## 2012-04-22 DIAGNOSIS — C49A0 Gastrointestinal stromal tumor, unspecified site: Secondary | ICD-10-CM

## 2012-04-22 DIAGNOSIS — D500 Iron deficiency anemia secondary to blood loss (chronic): Secondary | ICD-10-CM | POA: Diagnosis not present

## 2012-04-22 DIAGNOSIS — K5731 Diverticulosis of large intestine without perforation or abscess with bleeding: Secondary | ICD-10-CM | POA: Diagnosis not present

## 2012-04-22 DIAGNOSIS — R198 Other specified symptoms and signs involving the digestive system and abdomen: Secondary | ICD-10-CM | POA: Diagnosis not present

## 2012-04-22 MED ORDER — OXYCODONE-ACETAMINOPHEN 5-325 MG PO TABS: 1.0000 | ORAL_TABLET | ORAL | Status: DC | PRN

## 2012-04-22 NOTE — Telephone Encounter (Addendum)
Pt called requesting a refill for Percocet 5/325 mg. She has 4 pills left. Will route to Dr Marin Olp for approval & signature then place at the front desk for pick up.   04/24/12 Called the home # asking to speak with the pt. Her husband answered the phone and stated "we need her pain medication". Explained that several attempts have been made to reach her but there was no answer and we were unable to leave a message. Asked to speak with the pt but he continued to talk instead. Also discussed the need to come in to complete paperwork to obtain her [place]. Abran Richard has the paperwork ready to be signed but will have to provide their most recent tax return. He stated "I'll have to talk to her about all of that. She knows more about that than I do". Asked if they could discuss it and bring it in when her Percocet rx is picked up. Will need to submit ASAP so she can start the medication to treat her tumor. Reinforcement will most likely be needed as he was hesitant about completing the forms and getting the information to the office.  His daughter later called stating that no one had returned their call from Monday and that she was completely out of pain pills. Wanted to know why know one had sent it to her pharmacy. Explained that calls have been made on a daily basis and no one has answered. Also explained that Percocet can't be called in and that is why it wasn't at her pharmacy. Talked about the process for obtaining her [place]. She doesn't feel as though her father understands. Stated that they sit there and won't answer the phone if they don't recognize the #. Asked if there was an alternate # to use. She gave her home # [phone] but she doesn't have voicemail either.Marland Kitchen"I just check my caller ID and return calls from there". Explained that not all of the phone #s show up from the office. Dr Antonieta Pert office phone often comes up as "unavailable". Assured her that we would try our best to work with them. She is going  to call her parents to touch base with them.

## 2012-04-22 NOTE — Telephone Encounter (Signed)
I attempted to contact patient to determine whether or not they have prescription insurance coverage.  There was no answer.  I will try again and continue to attempt to reach the patient.

## 2012-04-24 DIAGNOSIS — R609 Edema, unspecified: Secondary | ICD-10-CM | POA: Diagnosis not present

## 2012-04-24 DIAGNOSIS — D500 Iron deficiency anemia secondary to blood loss (chronic): Secondary | ICD-10-CM | POA: Diagnosis not present

## 2012-04-24 DIAGNOSIS — I080 Rheumatic disorders of both mitral and aortic valves: Secondary | ICD-10-CM | POA: Diagnosis not present

## 2012-04-24 DIAGNOSIS — R198 Other specified symptoms and signs involving the digestive system and abdomen: Secondary | ICD-10-CM | POA: Diagnosis not present

## 2012-04-24 DIAGNOSIS — K5731 Diverticulosis of large intestine without perforation or abscess with bleeding: Secondary | ICD-10-CM | POA: Diagnosis not present

## 2012-04-26 ENCOUNTER — Telehealth: Payer: Self-pay | Admitting: Dietician

## 2012-04-26 NOTE — Telephone Encounter (Signed)
Nutrition Note  Patient identified to be at risk on malnutrition screen.  Wt Readings from Last 10 Encounters:  04/17/12 208 lb (94.348 kg)  04/03/12 238 lb 3.2 oz (108.047 kg)  04/03/12 238 lb 3.2 oz (108.047 kg)    Patient with weight loss of 20 lbs in the past 3 weeks.  Spoke with patient who states that weight loss has been secondary to fluid loss.  Reports that she is eating well and has Boost/Ensure if she needs it.  UBW=180-100 lbs.  No needs at this time.  Antonieta Iba, RD, LDN

## 2012-04-29 DIAGNOSIS — R5383 Other fatigue: Secondary | ICD-10-CM | POA: Diagnosis not present

## 2012-04-29 DIAGNOSIS — R198 Other specified symptoms and signs involving the digestive system and abdomen: Secondary | ICD-10-CM | POA: Diagnosis not present

## 2012-04-29 DIAGNOSIS — K5731 Diverticulosis of large intestine without perforation or abscess with bleeding: Secondary | ICD-10-CM | POA: Diagnosis not present

## 2012-04-29 DIAGNOSIS — R609 Edema, unspecified: Secondary | ICD-10-CM | POA: Diagnosis not present

## 2012-04-29 DIAGNOSIS — I080 Rheumatic disorders of both mitral and aortic valves: Secondary | ICD-10-CM | POA: Diagnosis not present

## 2012-04-29 DIAGNOSIS — D500 Iron deficiency anemia secondary to blood loss (chronic): Secondary | ICD-10-CM | POA: Diagnosis not present

## 2012-05-01 DIAGNOSIS — R5381 Other malaise: Secondary | ICD-10-CM | POA: Diagnosis not present

## 2012-05-01 DIAGNOSIS — R198 Other specified symptoms and signs involving the digestive system and abdomen: Secondary | ICD-10-CM | POA: Diagnosis not present

## 2012-05-01 DIAGNOSIS — K5731 Diverticulosis of large intestine without perforation or abscess with bleeding: Secondary | ICD-10-CM | POA: Diagnosis not present

## 2012-05-01 DIAGNOSIS — R609 Edema, unspecified: Secondary | ICD-10-CM | POA: Diagnosis not present

## 2012-05-01 DIAGNOSIS — D500 Iron deficiency anemia secondary to blood loss (chronic): Secondary | ICD-10-CM | POA: Diagnosis not present

## 2012-05-01 DIAGNOSIS — I080 Rheumatic disorders of both mitral and aortic valves: Secondary | ICD-10-CM | POA: Diagnosis not present

## 2012-05-06 DIAGNOSIS — R198 Other specified symptoms and signs involving the digestive system and abdomen: Secondary | ICD-10-CM | POA: Diagnosis not present

## 2012-05-06 DIAGNOSIS — I080 Rheumatic disorders of both mitral and aortic valves: Secondary | ICD-10-CM | POA: Diagnosis not present

## 2012-05-06 DIAGNOSIS — K5731 Diverticulosis of large intestine without perforation or abscess with bleeding: Secondary | ICD-10-CM | POA: Diagnosis not present

## 2012-05-06 DIAGNOSIS — R609 Edema, unspecified: Secondary | ICD-10-CM | POA: Diagnosis not present

## 2012-05-06 DIAGNOSIS — D500 Iron deficiency anemia secondary to blood loss (chronic): Secondary | ICD-10-CM | POA: Diagnosis not present

## 2012-05-08 DIAGNOSIS — D500 Iron deficiency anemia secondary to blood loss (chronic): Secondary | ICD-10-CM | POA: Diagnosis not present

## 2012-05-08 DIAGNOSIS — R609 Edema, unspecified: Secondary | ICD-10-CM | POA: Diagnosis not present

## 2012-05-08 DIAGNOSIS — K5731 Diverticulosis of large intestine without perforation or abscess with bleeding: Secondary | ICD-10-CM | POA: Diagnosis not present

## 2012-05-08 DIAGNOSIS — I080 Rheumatic disorders of both mitral and aortic valves: Secondary | ICD-10-CM | POA: Diagnosis not present

## 2012-05-08 DIAGNOSIS — R5381 Other malaise: Secondary | ICD-10-CM | POA: Diagnosis not present

## 2012-05-08 DIAGNOSIS — R198 Other specified symptoms and signs involving the digestive system and abdomen: Secondary | ICD-10-CM | POA: Diagnosis not present

## 2012-05-15 DIAGNOSIS — K5731 Diverticulosis of large intestine without perforation or abscess with bleeding: Secondary | ICD-10-CM | POA: Diagnosis not present

## 2012-05-20 ENCOUNTER — Telehealth: Payer: Self-pay | Admitting: Hematology & Oncology

## 2012-05-20 NOTE — Telephone Encounter (Signed)
Pt called made 3-27 appointment said started [place] on 3-13. RN aware

## 2012-05-30 ENCOUNTER — Other Ambulatory Visit (HOSPITAL_BASED_OUTPATIENT_CLINIC_OR_DEPARTMENT_OTHER): Payer: Medicare Other | Admitting: Lab

## 2012-05-30 ENCOUNTER — Encounter: Payer: Self-pay | Admitting: Hematology & Oncology

## 2012-05-30 ENCOUNTER — Ambulatory Visit (HOSPITAL_BASED_OUTPATIENT_CLINIC_OR_DEPARTMENT_OTHER): Payer: Medicare Other | Admitting: Hematology & Oncology

## 2012-05-30 VITALS — BP 139/70 | HR 84 | Temp 98.1°F | Resp 16 | Ht 64.0 in | Wt 179.0 lb

## 2012-05-30 DIAGNOSIS — C49A0 Gastrointestinal stromal tumor, unspecified site: Secondary | ICD-10-CM

## 2012-05-30 DIAGNOSIS — D500 Iron deficiency anemia secondary to blood loss (chronic): Secondary | ICD-10-CM

## 2012-05-30 DIAGNOSIS — C494 Malignant neoplasm of connective and soft tissue of abdomen: Secondary | ICD-10-CM | POA: Diagnosis not present

## 2012-05-30 HISTORY — DX: Gastrointestinal stromal tumor, unspecified site: C49.A0

## 2012-05-30 LAB — COMPREHENSIVE METABOLIC PANEL
Alkaline Phosphatase: 82 U/L (ref 39–117)
BUN: 12 mg/dL (ref 6–23)
Creatinine, Ser: 0.73 mg/dL (ref 0.50–1.10)
Glucose, Bld: 74 mg/dL (ref 70–99)
Total Bilirubin: 0.4 mg/dL (ref 0.3–1.2)

## 2012-05-30 LAB — MAGNESIUM: Magnesium: 1.9 mg/dL (ref 1.5–2.5)

## 2012-05-30 LAB — FERRITIN: Ferritin: 200 ng/mL (ref 10–291)

## 2012-05-30 LAB — CBC WITH DIFFERENTIAL (CANCER CENTER ONLY)
BASO#: 0 10*3/uL (ref 0.0–0.2)
EOS%: 3.2 % (ref 0.0–7.0)
LYMPH%: 26.3 % (ref 14.0–48.0)
MCH: 23.6 pg — ABNORMAL LOW (ref 26.0–34.0)
MCHC: 31.7 g/dL — ABNORMAL LOW (ref 32.0–36.0)
MCV: 75 fL — ABNORMAL LOW (ref 81–101)
MONO%: 12.7 % (ref 0.0–13.0)
NEUT#: 2.5 10*3/uL (ref 1.5–6.5)
Platelets: 203 10*3/uL (ref 145–400)

## 2012-05-30 LAB — IRON AND TIBC
%SAT: 31 % (ref 20–55)
Iron: 59 ug/dL (ref 42–145)
UIBC: 130 ug/dL (ref 125–400)

## 2012-05-30 MED ORDER — MEGESTROL ACETATE 40 MG PO TABS: 40.0000 mg | ORAL_TABLET | Freq: Every day | ORAL | Status: DC

## 2012-05-30 NOTE — Progress Notes (Signed)
CC:   Dibas Koirala, MD Shannon Jarred, MD  DIAGNOSIS:  Rectal malignant gastrointestinal stromal tumor.  CURRENT THERAPY:  Gleevec 400 mg p.o. daily-patient started it 2 weeks ago.  INTERIM HISTORY:  Shannon Terry comes in for followup.  We finally got her onto [place].  It was certainly a struggle to get this for her.  She started it 2 weeks ago.  She states she started feeling better after taking it.  She still has a little bit of rectal bleeding, but not nearly as it was.  She is taking some oral iron.  I think she can probably stop the oral iron.  She has had no nausea or vomiting.  She is on Megace 40 mg a day to try to help with her appetite.  She has some chronic leg swelling, but she does not feel this is any worse.  PHYSICAL EXAMINATION:  General:  This is a well-developed, well- nourished African American female in no obvious distress.  Vital signs: Temperature of 98.1, pulse 84, respiratory rate 16, blood pressure 139/70.  Weight is 179.  Head and neck:  Normocephalic, atraumatic skull.  There are no ocular or oral lesions.  There are no palpable cervical or supraclavicular lymph nodes.  Lungs:  Clear bilaterally. Cardiac:  Regular rate and rhythm with a normal S1 and S2.  There are no murmurs, rubs, or bruits.  Abdomen:  Soft with good bowel sounds.  There is no palpable abdominal mass.  There is no fluid wave.  There is no palpable hepatosplenomegaly.  Extremities:  Some 1+ chronic nonpitting edema.  Skin:  No rashes, ecchymosis, or petechia.  LABORATORY STUDIES:  White cell count 4.4, hemoglobin 10.5, hematocrit 33.1, platelet count 203.  MCV is 75.  IMPRESSION:  Shannon Terry is a very charming 72 year old African American female with a GIST tumor of the rectum.  This certainly is an unusual location for this.  She should do well with Gleevec.  Studies with GIST tumors show good responses.  The response, however, might take a while to obtain.  I will not do another CT  scan on her probably until May.  We will recheck her iron studies.  If her iron studies are worse, then I will give her a dose of IV iron.  I want to see her back in a month.    ______________________________ Volanda Napoleon, M.D. PRE/MEDQ  D:  05/29/2012  T:  05/30/2012  Job:  [id number]

## 2012-05-30 NOTE — Progress Notes (Signed)
This office note has been dictated.

## 2012-06-05 DIAGNOSIS — D500 Iron deficiency anemia secondary to blood loss (chronic): Secondary | ICD-10-CM | POA: Diagnosis not present

## 2012-06-05 DIAGNOSIS — R609 Edema, unspecified: Secondary | ICD-10-CM | POA: Diagnosis not present

## 2012-06-05 DIAGNOSIS — R198 Other specified symptoms and signs involving the digestive system and abdomen: Secondary | ICD-10-CM | POA: Diagnosis not present

## 2012-06-05 DIAGNOSIS — I080 Rheumatic disorders of both mitral and aortic valves: Secondary | ICD-10-CM | POA: Diagnosis not present

## 2012-06-05 DIAGNOSIS — R5383 Other fatigue: Secondary | ICD-10-CM | POA: Diagnosis not present

## 2012-06-05 DIAGNOSIS — K5731 Diverticulosis of large intestine without perforation or abscess with bleeding: Secondary | ICD-10-CM | POA: Diagnosis not present

## 2012-07-01 ENCOUNTER — Other Ambulatory Visit (HOSPITAL_BASED_OUTPATIENT_CLINIC_OR_DEPARTMENT_OTHER): Payer: Medicare Other | Admitting: Lab

## 2012-07-01 ENCOUNTER — Ambulatory Visit (HOSPITAL_BASED_OUTPATIENT_CLINIC_OR_DEPARTMENT_OTHER): Payer: Medicare Other

## 2012-07-01 ENCOUNTER — Ambulatory Visit (HOSPITAL_BASED_OUTPATIENT_CLINIC_OR_DEPARTMENT_OTHER): Payer: Medicare Other | Admitting: Hematology & Oncology

## 2012-07-01 DIAGNOSIS — C494 Malignant neoplasm of connective and soft tissue of abdomen: Secondary | ICD-10-CM

## 2012-07-01 DIAGNOSIS — D509 Iron deficiency anemia, unspecified: Secondary | ICD-10-CM | POA: Diagnosis not present

## 2012-07-01 DIAGNOSIS — D500 Iron deficiency anemia secondary to blood loss (chronic): Secondary | ICD-10-CM

## 2012-07-01 DIAGNOSIS — D649 Anemia, unspecified: Secondary | ICD-10-CM

## 2012-07-01 DIAGNOSIS — K922 Gastrointestinal hemorrhage, unspecified: Secondary | ICD-10-CM

## 2012-07-01 DIAGNOSIS — C49A0 Gastrointestinal stromal tumor, unspecified site: Secondary | ICD-10-CM

## 2012-07-01 LAB — COMPREHENSIVE METABOLIC PANEL
AST: 10 U/L (ref 0–37)
Albumin: 3.6 g/dL (ref 3.5–5.2)
BUN: 10 mg/dL (ref 6–23)
Calcium: 8.2 mg/dL — ABNORMAL LOW (ref 8.4–10.5)
Chloride: 114 mEq/L — ABNORMAL HIGH (ref 96–112)
Creatinine, Ser: 0.85 mg/dL (ref 0.50–1.10)
Glucose, Bld: 83 mg/dL (ref 70–99)
Potassium: 3.6 mEq/L (ref 3.5–5.3)

## 2012-07-01 LAB — CBC WITH DIFFERENTIAL (CANCER CENTER ONLY)
BASO#: 0 10*3/uL (ref 0.0–0.2)
Eosinophils Absolute: 0.3 10*3/uL (ref 0.0–0.5)
HGB: 9.6 g/dL — ABNORMAL LOW (ref 11.6–15.9)
LYMPH%: 33.1 % (ref 14.0–48.0)
MCV: 73 fL — ABNORMAL LOW (ref 81–101)
MONO#: 0.5 10*3/uL (ref 0.1–0.9)
NEUT#: 1.5 10*3/uL (ref 1.5–6.5)
Platelets: 211 10*3/uL (ref 145–400)
RBC: 4.08 10*6/uL (ref 3.70–5.32)
WBC: 3.4 10*3/uL — ABNORMAL LOW (ref 3.9–10.0)

## 2012-07-01 LAB — IRON AND TIBC
Iron: 82 ug/dL (ref 42–145)
TIBC: 237 ug/dL — ABNORMAL LOW (ref 250–470)
UIBC: 155 ug/dL (ref 125–400)

## 2012-07-01 LAB — CHCC SATELLITE - SMEAR

## 2012-07-01 MED ORDER — SODIUM CHLORIDE 0.9 % IV SOLN
Freq: Once | INTRAVENOUS | Status: AC
  Administered 2012-07-01: 10:00:00 via INTRAVENOUS

## 2012-07-01 MED ORDER — FERUMOXYTOL INJECTION 510 MG/17 ML
1020.0000 mg | Freq: Once | INTRAVENOUS | Status: AC
  Administered 2012-07-01: 1020 mg via INTRAVENOUS
  Filled 2012-07-01: qty 34

## 2012-07-01 NOTE — Progress Notes (Signed)
This office note has been dictated.

## 2012-07-01 NOTE — Patient Instructions (Signed)
Ferumoxytol injection What is this medicine? FERUMOXYTOL is an iron complex. Iron is used to make healthy red blood cells, which carry oxygen and nutrients throughout the body. This medicine is used to treat iron deficiency anemia in people with chronic kidney disease. This medicine may be used for other purposes; ask your health care provider or pharmacist if you have questions. What should I tell my health care provider before I take this medicine? They need to know if you have any of these conditions: -anemia not caused by low iron levels -high levels of iron in the blood -magnetic resonance imaging (MRI) test scheduled -an unusual or allergic reaction to iron, other medicines, foods, dyes, or preservatives -pregnant or trying to get pregnant -breast-feeding How should I use this medicine? This medicine is for infusion into a vein. It is given by a health care professional in a hospital or clinic setting. Talk to your pediatrician regarding the use of this medicine in children. Special care may be needed. Overdosage: If you think you've taken too much of this medicine contact a poison control center or emergency room at once. Overdosage: If you think you have taken too much of this medicine contact a poison control center or emergency room at once. NOTE: This medicine is only for you. Do not share this medicine with others. What if I miss a dose? It is important not to miss your dose. Call your doctor or health care professional if you are unable to keep an appointment. What may interact with this medicine? This medicine may interact with the following medications: -other iron products This list may not describe all possible interactions. Give your health care provider a list of all the medicines, herbs, non-prescription drugs, or dietary supplements you use. Also tell them if you smoke, drink alcohol, or use illegal drugs. Some items may interact with your medicine. What should I watch  for while using this medicine? Visit your doctor or healthcare professional regularly. Tell your doctor or healthcare professional if your symptoms do not start to get better or if they get worse. You may need blood work done while you are taking this medicine. You may need to follow a special diet. Talk to your doctor. Foods that contain iron include: whole grains/cereals, dried fruits, beans, or peas, leafy green vegetables, and organ meats (liver, kidney). What side effects may I notice from receiving this medicine? Side effects that you should report to your doctor or health care professional as soon as possible: -allergic reactions like skin rash, itching or hives, swelling of the face, lips, or tongue -breathing problems -changes in blood pressure -feeling faint or lightheaded, falls -fever or chills -flushing, sweating, or hot feelings -swelling of the ankles or feet Side effects that usually do not require medical attention (Report these to your doctor or health care professional if they continue or are bothersome.): -diarrhea -headache -nausea, vomiting -stomach pain This list may not describe all possible side effects. Call your doctor for medical advice about side effects. You may report side effects to FDA at 1-800-FDA-1088. Where should I keep my medicine? This drug is given in a hospital or clinic and will not be stored at home. NOTE: This sheet is a summary. It may not cover all possible information. If you have questions about this medicine, talk to your doctor, pharmacist, or health care provider.  2012, [employer]vier/[employer]. (11/13/2007 9:48:25 PM) 

## 2012-07-02 NOTE — Progress Notes (Signed)
CC:   Dibas Koirala, MD Zenovia Jarred, MD  DIAGNOSES:  Rectal gastrointestinal stromal tumor.  CURRENT THERAPY:  Gleevec 400 mg p.o. daily.  INTERIM HISTORY:  [name] comes in for her followup.  She is doing well with the [place].  She has had no problems with swelling.  She has had no obvious rectal pain.  There has been no bleeding with bowel movements.  Her appetite has been quite good.  She has had no cough or shortness breath.  There has been no headache.  She has had no nausea or vomiting.  Overall, her performance status is ECOG 1.  PHYSICAL EXAMINATION:  General:  This is a well-developed, well- nourished African American female in no obvious distress.  Vital signs: Temperature of 98.2, pulse 83, respiratory rate 16, blood pressure 146/64.  Weight is 176.  Head and neck:  Normocephalic, atraumatic skull.  There are no ocular or oral lesions.  There are no palpable cervical or supraclavicular lymph nodes.  Lungs:  Clear bilaterally. Cardiac:  Regular rate and rhythm with a normal S1 and S2.  There are no murmurs, rubs, or bruits.  Abdomen:  Soft with good bowel sounds.  There is no palpable abdominal mass.  There is no fluid wave.  There is no palpable hepatosplenomegaly.  Extremities:  No clubbing or cyanosis. She has some trace edema which is chronic.  Skin:  No rashes, ecchymosis, or petechia.  LABORATORY STUDIES:  White cell count of 3.4, hemoglobin 9.6, hematocrit 29.7, platelet count 211.  MCV is 73.  IMPRESSION:  [name] is a very nice 72 year old African American female with a gastrointestinal stromal tumor of the rectum.  This is an usual spot for this, but this is biopsy-positive.  With [place], we just need to be patient with this.  I think by her lack of symptoms, one would have to think that her tumor is responding.  We will go and give her IV iron today.  I think this will help her out.  We will get her set up with a CT scan after [facility] Day.  I will  see her back after her scans are done.   ______________________________ Volanda Napoleon, M.D. PRE/MEDQ  D:  07/01/2012  T:  07/02/2012  Job:  [id number]

## 2012-08-02 ENCOUNTER — Ambulatory Visit (HOSPITAL_COMMUNITY)
Admission: RE | Admit: 2012-08-02 | Discharge: 2012-08-02 | Disposition: A | Discharge status: Home / Self Care | Payer: Medicare Other | Source: Ambulatory Visit | Attending: Hematology & Oncology | Admitting: Hematology & Oncology

## 2012-08-02 DIAGNOSIS — C49A0 Gastrointestinal stromal tumor, unspecified site: Secondary | ICD-10-CM

## 2012-08-02 DIAGNOSIS — R198 Other specified symptoms and signs involving the digestive system and abdomen: Secondary | ICD-10-CM | POA: Diagnosis not present

## 2012-08-02 DIAGNOSIS — C20 Malignant neoplasm of rectum: Secondary | ICD-10-CM | POA: Diagnosis not present

## 2012-08-02 MED ORDER — IOHEXOL 300 MG/ML  SOLN
100.0000 mL | Freq: Once | INTRAMUSCULAR | Status: AC | PRN
  Administered 2012-08-02: 100 mL via INTRAVENOUS

## 2012-08-05 ENCOUNTER — Telehealth: Payer: Self-pay | Admitting: *Deleted

## 2012-08-05 NOTE — Telephone Encounter (Signed)
Message copied by Rico Ala on Mon Aug 05, 2012  1:09 PM ------      Message from: Volanda Napoleon      Created: Sun Aug 04, 2012  8:31 PM       Call her dgtr - tumor is shrinking nicely!!  Probably about 30% smaller!!  Laurey Arrow ------

## 2012-08-05 NOTE — Telephone Encounter (Signed)
Called patient to let her know that her tumor is shrinking nicely on CT scan.

## 2012-08-09 ENCOUNTER — Ambulatory Visit (HOSPITAL_BASED_OUTPATIENT_CLINIC_OR_DEPARTMENT_OTHER): Payer: Medicare Other | Admitting: Hematology & Oncology

## 2012-08-09 ENCOUNTER — Other Ambulatory Visit (HOSPITAL_BASED_OUTPATIENT_CLINIC_OR_DEPARTMENT_OTHER): Payer: Medicare Other | Admitting: Lab

## 2012-08-09 VITALS — BP 159/77 | HR 81 | Temp 98.1°F | Resp 16 | Ht 64.0 in | Wt 158.0 lb

## 2012-08-09 DIAGNOSIS — C494 Malignant neoplasm of connective and soft tissue of abdomen: Secondary | ICD-10-CM | POA: Diagnosis not present

## 2012-08-09 DIAGNOSIS — R630 Anorexia: Secondary | ICD-10-CM

## 2012-08-09 DIAGNOSIS — C49A0 Gastrointestinal stromal tumor, unspecified site: Secondary | ICD-10-CM

## 2012-08-09 DIAGNOSIS — R634 Abnormal weight loss: Secondary | ICD-10-CM

## 2012-08-09 DIAGNOSIS — R64 Cachexia: Secondary | ICD-10-CM

## 2012-08-09 LAB — COMPREHENSIVE METABOLIC PANEL
AST: 16 U/L (ref 0–37)
Albumin: 4 g/dL (ref 3.5–5.2)
BUN: 10 mg/dL (ref 6–23)
Calcium: 8.5 mg/dL (ref 8.4–10.5)
Chloride: 110 mEq/L (ref 96–112)
Glucose, Bld: 80 mg/dL (ref 70–99)
Potassium: 3.7 mEq/L (ref 3.5–5.3)

## 2012-08-09 LAB — CBC WITH DIFFERENTIAL (CANCER CENTER ONLY)
BASO#: 0 10*3/uL (ref 0.0–0.2)
EOS%: 6.1 % (ref 0.0–7.0)
Eosinophils Absolute: 0.1 10*3/uL (ref 0.0–0.5)
HGB: 9.7 g/dL — ABNORMAL LOW (ref 11.6–15.9)
LYMPH#: 0.9 10*3/uL (ref 0.9–3.3)
MONO#: 0.3 10*3/uL (ref 0.1–0.9)
NEUT#: 1 10*3/uL — ABNORMAL LOW (ref 1.5–6.5)
RBC: 4.17 10*6/uL (ref 3.70–5.32)
WBC: 2.3 10*3/uL — ABNORMAL LOW (ref 3.9–10.0)

## 2012-08-09 LAB — IRON AND TIBC
%SAT: 76 % — ABNORMAL HIGH (ref 20–55)
TIBC: 149 ug/dL — ABNORMAL LOW (ref 250–470)

## 2012-08-09 LAB — TECHNOLOGIST REVIEW CHCC SATELLITE

## 2012-08-09 MED ORDER — MEGESTROL ACETATE 40 MG/ML PO SUSP: 800.0000 mg | Freq: Every day | ORAL | Status: DC

## 2012-08-09 NOTE — Progress Notes (Signed)
This office note has been dictated.

## 2012-08-10 NOTE — Progress Notes (Signed)
CC:   Shannon Koirala, MD Shannon Jarred, MD  DIAGNOSIS:  Rectal gastrointestinal stromal tumor.  CURRENT THERAPY:  Gleevec 400 mg p.o. daily.  INTERVAL HISTORY:  Shannon Terry comes in for followup.  She is still losing some weight.  She is not eating that much.  She is on Megace 40 mg a day.  I want to see about getting her on Megace elixir at 800 mg a day.  I really want to try to get her to gain a little weight.  The [place] is working.  We did go ahead and repeat her scans.  This was done on May 30th.  The scans showed nice decrease in the rectal mass. It now measures 6.9 x 6.5 cm.  It previously measured 8.4 x 7.8 cm. There is no evidence of disease elsewhere.  She has not noticed any obvious bleeding.  There has been no rectal pain.  When we last saw her, her iron saturation was 35%.  Her iron total was 82.  She did get some IV iron.  She last got IV iron back in April.  PHYSICAL EXAMINATION:  General:  This is a well-developed, well- nourished African American female in no obvious distress.  Vital signs: Temperature of 98.1, pulse 81, respiratory rate 16, blood pressure 159/77, weight is 158.  Head and neck:  Normocephalic, atraumatic skull. There are no ocular or oral lesions.  There are no palpable cervical or supraclavicular lymph nodes.  Lungs:  Clear bilaterally.  Cardiac: Regular rate and rhythm with a normal S1 and S2.  There are no murmurs, rubs, or bruits.  Abdomen:  Soft with good bowel sounds.  There is no palpable abdominal mass.  There is no fluid wave.  There is no palpable hepatosplenomegaly.  Extremities:  No clubbing, cyanosis, or edema. Neurological:  Shows no focal neurological deficits.  LABORATORY STUDIES:  White cell count is 2.3, hemoglobin 9.7, hematocrit 30.3, platelet count 147.  IMPRESSION:  Shannon Terry is a very nice 72 year old African American female with a rectal gastrointestinal stromal tumor.  Again, she is responding to [place].  The weight loss  really bothers me.  I really would like to try get weight back on her.  She has lost almost 20 pounds since we saw her back in April.  She hopefully will take the Megace elixir.  I want to see her back in another 4-6 weeks.  Again, we have to really watch her weight closely.  I tried to encourage her.  Her family is very, very good about taking care of her and trying to get her to do the right thing.    ______________________________ Volanda Napoleon, M.D. PRE/MEDQ  D:  08/09/2012  T:  08/10/2012  Job:  [id number]

## 2012-09-13 ENCOUNTER — Ambulatory Visit (HOSPITAL_BASED_OUTPATIENT_CLINIC_OR_DEPARTMENT_OTHER): Payer: Medicare Other | Admitting: Medical

## 2012-09-13 ENCOUNTER — Other Ambulatory Visit (HOSPITAL_BASED_OUTPATIENT_CLINIC_OR_DEPARTMENT_OTHER): Payer: Medicare Other | Admitting: Lab

## 2012-09-13 VITALS — BP 175/78 | HR 69 | Temp 98.1°F | Resp 16 | Ht 64.0 in | Wt 176.0 lb

## 2012-09-13 DIAGNOSIS — D500 Iron deficiency anemia secondary to blood loss (chronic): Secondary | ICD-10-CM

## 2012-09-13 DIAGNOSIS — C494 Malignant neoplasm of connective and soft tissue of abdomen: Secondary | ICD-10-CM

## 2012-09-13 DIAGNOSIS — R64 Cachexia: Secondary | ICD-10-CM

## 2012-09-13 DIAGNOSIS — C49A0 Gastrointestinal stromal tumor, unspecified site: Secondary | ICD-10-CM

## 2012-09-13 LAB — COMPREHENSIVE METABOLIC PANEL
ALT: <8 U/L (ref 0–35)
AST: 13 U/L (ref 0–37)
Albumin: 4 g/dL (ref 3.5–5.2)
Alkaline Phosphatase: 52 U/L (ref 39–117)
BUN: 17 mg/dL (ref 6–23)
Calcium: 8.7 mg/dL (ref 8.4–10.5)
Chloride: 112 mEq/L (ref 96–112)
Potassium: 3.8 mEq/L (ref 3.5–5.3)

## 2012-09-13 LAB — FERRITIN CHCC: Ferritin: 494 ng/ml — ABNORMAL HIGH (ref 9–269)

## 2012-09-13 LAB — CBC WITH DIFFERENTIAL (CANCER CENTER ONLY)
BASO#: 0 10*3/uL (ref 0.0–0.2)
BASO%: 0.3 % (ref 0.0–2.0)
EOS%: 1.6 % (ref 0.0–7.0)
LYMPH#: 1.4 10*3/uL (ref 0.9–3.3)
MCH: 24.6 pg — ABNORMAL LOW (ref 26.0–34.0)
MCHC: 31.8 g/dL — ABNORMAL LOW (ref 32.0–36.0)
MONO%: 14.7 % — ABNORMAL HIGH (ref 0.0–13.0)
NEUT#: 1.7 10*3/uL (ref 1.5–6.5)
Platelets: 194 10*3/uL (ref 145–400)
RDW: 18.5 % — ABNORMAL HIGH (ref 11.1–15.7)

## 2012-09-13 LAB — IRON AND TIBC CHCC
%SAT: 53 % (ref 21–57)
TIBC: 216 ug/dL — ABNORMAL LOW (ref 236–444)
UIBC: 102 ug/dL — ABNORMAL LOW (ref 120–384)

## 2012-09-13 NOTE — Progress Notes (Signed)
DIAGNOSIS:   #1 Rectal gastrointestinal stromal tumor. #2 intermittent iron deficiency anemia  CURRENT THERAPY:   #1.Gleevec 400 mg p.o. Daily. #2.IV iron as indicated.  Last dose of IV iron was back on July 01 2012  INTERVAL HISTORY: Shannon Terry presents today for an office followup visit.  Shannon Terry family accompanies Shannon Terry.  Surprisingly enough, Shannon Terry has gained about 18 pounds.  Shannon Terry is now on the Megace elixir and it is working quite well.  Shannon Terry last CT scan back on May 30 showed a nice decrease in the rectal mass.  And now measures 6.9 x 6.5 cm it previously measured 8 point or 7.8 cm.  There was no evidence of disease elsewhere.  We have had to give Shannon Terry IV iron in the past.  Shannon Terry last dose of IV iron was back on 07/01/2012.  Shannon Terry last iron panel back in June revealed an iron of 113 was 76% saturation and a ferritin level of 991.  Shannon Terry seems to be giving along quite well.  Shannon Terry's not reporting any nausea or vomiting.  Shannon Terry denies any diarrhea or constipation.  Shannon Terry denies any fevers chills or night sweats.  Shannon Terry denies any chest pain shortness of breath or cough.  Shannon Terry denies any abdominal pain.  Shannon Terry denies any obvious or abnormal.  Shannon Terry denies any generalized swelling.  Shannon Terry denies any headaches, visual changes or rashes.  Review of Systems: Constitutional:Negative for malaise/fatigue, fever, chills, weight loss, diaphoresis, activity change, appetite change, and unexpected weight change.  HEENT: Negative for double vision, blurred vision, visual loss, ear pain, tinnitus, congestion, rhinorrhea, epistaxis sore throat or sinus disease, oral pain/lesion, tongue soreness Respiratory: Negative for cough, chest tightness, shortness of breath, wheezing and stridor.  Cardiovascular: Negative for chest pain, palpitations, leg swelling, orthopnea, PND, DOE or claudication Gastrointestinal: Negative for nausea, vomiting, abdominal pain, diarrhea, constipation, blood in stool, melena, hematochezia, abdominal distention, anal  bleeding, rectal pain, anorexia and hematemesis.  Genitourinary: Negative for dysuria, frequency, hematuria,  Musculoskeletal: Negative for myalgias, back pain, joint swelling, arthralgias and gait problem.  Skin: Negative for rash, color change, pallor and wound.  Neurological:. Negative for dizziness/light-headedness, tremors, seizures, syncope, facial asymmetry, speech difficulty, weakness, numbness, headaches and paresthesias.  Hematological: Negative for adenopathy. Does not bruise/bleed easily.  Psychiatric/Behavioral:  Negative for depression, no loss of interest in normal activity or change in sleep pattern.   Physical Exam: Very pleasant 72 year old well-developed well-nourished African American female in no obvious distress. Vitals: Temperature 90.1 degrees pulse 72 respirations 16 blood pressure 175/78 weight 176 pounds HEENT reveals a normocephalic, atraumatic skull, no scleral icterus, no oral lesions  Neck is supple without any cervical or supraclavicular adenopathy.  Lungs are clear to auscultation bilaterally. There are no wheezes, rales or rhonci Cardiac is regular rate and rhythm with a normal S1 and S2. There are no murmurs, rubs, or bruits.  Abdomen is soft with good bowel sounds, there is no palpable mass. There is no palpable hepatosplenomegaly. There is no palpable fluid wave.  Musculoskeletal no tenderness of the spine, ribs, or hips.  Extremities there are no clubbing, cyanosis, or edema.  Skin no petechia, purpura or ecchymosis Neurologic is nonfocal.  Laboratory Data: White count 3.7 hemoglobin 9.7 hematocrit 30.5 MCV 77 platelets 194,000  Current Outpatient Prescriptions on File Prior to Visit  Medication Sig Dispense Refill  . furosemide (LASIX) 40 MG tablet Take 20 mg by mouth as needed. As needed      . imatinib (GLEEVEC) 400 MG tablet Take  400 mg by mouth daily. Take with meals and large glass of water.Caution:Chemotherapy.      . megestrol (MEGACE ORAL) 40  MG/ML suspension Take 20 mLs (800 mg total) by mouth daily.  480 mL  0  . feeding supplement (ENSURE) PUDG Take 1 Container by mouth. PT D/C ON Shannon Terry OWN      . oxyCODONE-acetaminophen (PERCOCET/ROXICET) 5-325 MG per tablet Take 1 tablet by mouth every 4 (four) hours as needed for pain.  90 tablet  0  . polyethylene glycol (MIRALAX / GLYCOLAX) packet Take 17 g by mouth as needed.       No current facility-administered medications on file prior to visit.   Assessment/Plan: This is a very pleasant 72 year old [place] American female with the following issues:  #1.  Rectal gastrointestinal stromal tumor.  Shannon Terry is responding to [place].  Shannon Terry will continue on the [place].  Shannon Terry's not having any major toxicity.  #2.  Weight loss.  This has resolved with the Megace elixir.  We will continue to monitor Shannon Terry weight closely.  #3.  Followup.  We will follow back up with [name] in 6 weeks or then should there be questions or concerns

## 2012-10-24 ENCOUNTER — Other Ambulatory Visit (HOSPITAL_BASED_OUTPATIENT_CLINIC_OR_DEPARTMENT_OTHER): Payer: Medicare Other | Admitting: Lab

## 2012-10-24 ENCOUNTER — Ambulatory Visit (HOSPITAL_BASED_OUTPATIENT_CLINIC_OR_DEPARTMENT_OTHER): Payer: Medicare Other | Admitting: Hematology & Oncology

## 2012-10-24 VITALS — BP 168/81 | HR 88 | Temp 98.6°F | Resp 16 | Ht 64.0 in | Wt 156.0 lb

## 2012-10-24 DIAGNOSIS — C494 Malignant neoplasm of connective and soft tissue of abdomen: Secondary | ICD-10-CM | POA: Diagnosis not present

## 2012-10-24 DIAGNOSIS — D509 Iron deficiency anemia, unspecified: Secondary | ICD-10-CM

## 2012-10-24 DIAGNOSIS — R64 Cachexia: Secondary | ICD-10-CM

## 2012-10-24 DIAGNOSIS — C49A0 Gastrointestinal stromal tumor, unspecified site: Secondary | ICD-10-CM

## 2012-10-24 LAB — COMPREHENSIVE METABOLIC PANEL
ALT: 10 U/L (ref 0–35)
AST: 16 U/L (ref 0–37)
Albumin: 4.3 g/dL (ref 3.5–5.2)
Alkaline Phosphatase: 65 U/L (ref 39–117)
Calcium: 9.4 mg/dL (ref 8.4–10.5)
Chloride: 113 mEq/L — ABNORMAL HIGH (ref 96–112)
Potassium: 3.8 mEq/L (ref 3.5–5.3)
Sodium: 141 mEq/L (ref 135–145)
Total Protein: 6.4 g/dL (ref 6.0–8.3)

## 2012-10-24 LAB — CBC WITH DIFFERENTIAL (CANCER CENTER ONLY)
BASO#: 0 10*3/uL (ref 0.0–0.2)
BASO%: 0.7 % (ref 0.0–2.0)
EOS%: 6.1 % (ref 0.0–7.0)
HGB: 9.9 g/dL — ABNORMAL LOW (ref 11.6–15.9)
LYMPH#: 1.3 10*3/uL (ref 0.9–3.3)
MCH: 24.8 pg — ABNORMAL LOW (ref 26.0–34.0)
MCHC: 32 g/dL (ref 32.0–36.0)
MONO%: 12.5 % (ref 0.0–13.0)
NEUT#: 1 10*3/uL — ABNORMAL LOW (ref 1.5–6.5)
Platelets: 183 10*3/uL (ref 145–400)

## 2012-10-24 MED ORDER — IMATINIB MESYLATE 400 MG PO TABS: 400.0000 mg | ORAL_TABLET | Freq: Every day | ORAL | Status: DC

## 2012-10-24 MED ORDER — MEGESTROL ACETATE 40 MG/ML PO SUSP: 800.0000 mg | Freq: Every day | ORAL | Status: DC

## 2012-10-24 MED ORDER — OXYCODONE-ACETAMINOPHEN 5-325 MG PO TABS: 1.0000 | ORAL_TABLET | ORAL | Status: DC | PRN

## 2012-10-24 NOTE — Progress Notes (Signed)
This office note has been dictated.

## 2012-10-25 LAB — IRON AND TIBC CHCC: UIBC: 86 ug/dL — ABNORMAL LOW (ref 120–384)

## 2012-10-25 NOTE — Progress Notes (Signed)
DIAGNOSES: 1. Rectal gastrointestinal stromal tumor. 2. Intermittent iron-deficiency anemia.  CURRENT THERAPY:  Gleevec 400 mg p.o. daily.  INTERIM HISTORY:  Shannon Terry comes in for followup.  Unfortunately the weight she has gained, she now has lost.  She lost about 20 pounds.  She says that her back is hurting her.  She is having some radiating pain down her lateral left leg.  This has been going on for a few weeks.  Of course, she has not told anybody about this.  She says that when she hurts she does not like to eat.  She also states that she is not taking the Megace that we had given her before.  She has not noticed any bleeding.  She has not noticed any problems with nausea or vomiting.  There have been no problems with fever, sweats or chills.  PHYSICAL EXAMINATION:  General:  This is a fairly well-developed, well- nourished African American female in no obvious distress.  Vital signs: Temperature of 98.6, pulse 88, respiratory rate 16, blood pressure 168/81.  Weight is 156.  Head and neck:  Normocephalic, atraumatic skull.  There are no ocular or oral lesions.  There are no palpable cervical or supraclavicular lymph nodes.  Lungs:  Clear bilaterally. Cardiac:  Regular rate and rhythm with normal S1, S2.  There are no murmurs, rubs or bruits.  Abdomen:  Soft.  She has good bowel sounds. There is no fluid wave.  There is no palpable hepatosplenomegaly.  Back: Shows no tenderness over the spine, ribs, or hips.  Extremities:  Show no clubbing, cyanosis or edema.  There is good range motion of her joints.  She has good pulses in her distal extremities.  Skin:  No rashes, ecchymosis, or petechiae.  LABORATORY STUDIES:  White cell count is 2.8, hemoglobin 10, hematocrit 31, platelet count 183.  IMPRESSION:  Shannon Terry is a very nice 72 year old lateral African [place] female with a rectal GIST.  We will need to go ahead and set her up with some CAT scans.  Her last scans were  done back in May.  Hopefully, we are still seeing a response.  I do not think this back issue and left leg sciatica visible from the GIST tumor.  I encouraged her to take her Megace.  We will see what her iron studies show.  I want see her back in about 1 month.    ______________________________ Volanda Napoleon, M.D. PRE/MEDQ  D:  10/24/2012  T:  10/25/2012  Job:  [id number]

## 2012-10-29 ENCOUNTER — Other Ambulatory Visit: Payer: Self-pay | Admitting: *Deleted

## 2012-10-29 NOTE — Telephone Encounter (Signed)
Rx sent to [facility] for mail order.

## 2012-11-21 ENCOUNTER — Ambulatory Visit (HOSPITAL_COMMUNITY)
Admission: RE | Admit: 2012-11-21 | Discharge: 2012-11-21 | Disposition: A | Discharge status: Home / Self Care | Payer: Medicare Other | Source: Ambulatory Visit | Attending: Hematology & Oncology | Admitting: Hematology & Oncology

## 2012-11-21 ENCOUNTER — Encounter (HOSPITAL_COMMUNITY): Payer: Self-pay

## 2012-11-21 DIAGNOSIS — R64 Cachexia: Secondary | ICD-10-CM

## 2012-11-21 DIAGNOSIS — R599 Enlarged lymph nodes, unspecified: Secondary | ICD-10-CM | POA: Diagnosis not present

## 2012-11-21 DIAGNOSIS — K5730 Diverticulosis of large intestine without perforation or abscess without bleeding: Secondary | ICD-10-CM | POA: Insufficient documentation

## 2012-11-21 DIAGNOSIS — M479 Spondylosis, unspecified: Secondary | ICD-10-CM | POA: Insufficient documentation

## 2012-11-21 DIAGNOSIS — Z9221 Personal history of antineoplastic chemotherapy: Secondary | ICD-10-CM | POA: Insufficient documentation

## 2012-11-21 DIAGNOSIS — C49A0 Gastrointestinal stromal tumor, unspecified site: Secondary | ICD-10-CM

## 2012-11-21 DIAGNOSIS — C494 Malignant neoplasm of connective and soft tissue of abdomen: Secondary | ICD-10-CM | POA: Diagnosis not present

## 2012-11-21 MED ORDER — IOHEXOL 300 MG/ML  SOLN
100.0000 mL | Freq: Once | INTRAMUSCULAR | Status: AC | PRN
  Administered 2012-11-21: 100 mL via INTRAVENOUS

## 2012-11-25 ENCOUNTER — Telehealth: Payer: Self-pay | Admitting: *Deleted

## 2012-11-25 NOTE — Telephone Encounter (Signed)
Message copied by Rico Ala on Mon Nov 25, 2012 11:35 AM ------      Message from: Volanda Napoleon      Created: Fri Nov 22, 2012 10:06 PM       Call dgtr - cancer is still shrinking!!!  Laurey Arrow ------

## 2012-11-25 NOTE — Telephone Encounter (Signed)
Called patients daughter to let her know that her mothers cancer is still shrinking per dr. Marin Olp.

## 2012-11-27 ENCOUNTER — Other Ambulatory Visit (HOSPITAL_BASED_OUTPATIENT_CLINIC_OR_DEPARTMENT_OTHER): Payer: Medicare Other | Admitting: Lab

## 2012-11-27 ENCOUNTER — Ambulatory Visit (HOSPITAL_BASED_OUTPATIENT_CLINIC_OR_DEPARTMENT_OTHER): Payer: Medicare Other | Admitting: Hematology & Oncology

## 2012-11-27 ENCOUNTER — Ambulatory Visit: Payer: Medicare Other

## 2012-11-27 VITALS — BP 178/97 | HR 70 | Temp 98.5°F | Resp 16 | Ht 64.0 in | Wt 171.0 lb

## 2012-11-27 DIAGNOSIS — C49A0 Gastrointestinal stromal tumor, unspecified site: Secondary | ICD-10-CM

## 2012-11-27 DIAGNOSIS — C494 Malignant neoplasm of connective and soft tissue of abdomen: Secondary | ICD-10-CM | POA: Diagnosis not present

## 2012-11-27 DIAGNOSIS — R64 Cachexia: Secondary | ICD-10-CM | POA: Diagnosis not present

## 2012-11-27 LAB — CBC WITH DIFFERENTIAL (CANCER CENTER ONLY)
BASO%: 0.5 % (ref 0.0–2.0)
EOS%: 2.5 % (ref 0.0–7.0)
LYMPH%: 36.6 % (ref 14.0–48.0)
MCH: 25.1 pg — ABNORMAL LOW (ref 26.0–34.0)
MCHC: 32.5 g/dL (ref 32.0–36.0)
MCV: 77 fL — ABNORMAL LOW (ref 81–101)
MONO%: 13.9 % — ABNORMAL HIGH (ref 0.0–13.0)
NEUT%: 46.5 % (ref 39.6–80.0)
Platelets: 194 10*3/uL (ref 145–400)
RDW: 15.3 % (ref 11.1–15.7)
WBC: 4 10*3/uL (ref 3.9–10.0)

## 2012-11-27 LAB — CMP (CANCER CENTER ONLY)
Albumin: 3.7 g/dL (ref 3.3–5.5)
BUN, Bld: 14 mg/dL (ref 7–22)
Calcium: 8.9 mg/dL (ref 8.0–10.3)
Chloride: 111 mEq/L — ABNORMAL HIGH (ref 98–108)
Glucose, Bld: 90 mg/dL (ref 73–118)
Potassium: 3.9 mEq/L (ref 3.3–4.7)
Sodium: 141 mEq/L (ref 128–145)
Total Protein: 6.6 g/dL (ref 6.4–8.1)

## 2012-11-27 LAB — IRON AND TIBC CHCC: Iron: 89 ug/dL (ref 41–142)

## 2012-11-27 NOTE — Progress Notes (Signed)
Patient seen by Dr. Marin Olp, no need for [facility].

## 2012-11-27 NOTE — Progress Notes (Signed)
This office note has been dictated.

## 2012-12-03 NOTE — Progress Notes (Signed)
CC:   Dibas Koirala, MD  DIAGNOSES: 1. Rectal gastrointestinal stromal tumor. 2. Iron-deficiency anemia. 3.  CURRENT THERAPY:  Gleevec 400 mg p.o. q. day.  INTERIM HISTORY:  Ms. Demby comes in for followup.  She is doing okay. She is gaining weight.  She has gained 15 pounds since we last saw her. She is taking the Megace on a more regular basis.  She is eating better.  We did go ahead and repeat her CT scan.  This was done on September 18th.  The CT scan showed continued shrinkage of her rectal mass.  It now measures 6.3 x 5.3 x 5.2 cm.  No evidence of disease is noted elsewhere.  She has had no abdominal pain.  There is no melena or bright red blood per rectum.  She has had no cough or shortness of breath.  She has had no leg pain.  There has been no swelling of the legs.  She has had no rashes.  Overall, her performance status is ECOG 1.  PHYSICAL EXAMINATION:  General:  This is a fairly well developed, well nourished [place] American female in no obvious distress.  Vital Signs: Show temperature of 98.5, pulse 70, respiratory rate 16, blood pressure 178/97.  Weight is 171.  Head and Neck:  Shows a normocephalic, atraumatic skull.  There are no ocular or oral lesions.  There are no palpable cervical or supraclavicular lymph nodes.  Lungs:  Clear bilaterally.  Cardiac:  Regular rate and rhythm with a normal S1, S2. There are no murmurs, rubs or bruits.  Abdomen:  Soft.  She has good bowel sounds.  There is no fluid wave.  No palpable hepatosplenomegaly is noted.  Back:  No tenderness over the spine, ribs, or hips. Extremities:  Show no clubbing, cyanosis or edema.  Neurological:  Shows no focal neurological deficits.  LABORATORY STUDIES:  White cell count is 4, hemoglobin 10.3, hematocrit 31.7, platelet count 194.  IMPRESSION:  Ms. Bolinsky is a very 55, 72 year old African female with rectal gastrointestinal stromal tumor.  She presented, I think, back in January of this  year.  She is on Gleevec.  She is responding.  We will go ahead and get her back in 6 weeks.  We will plan for another scan in December.   ______________________________ Volanda Napoleon, M.D. PRE/MEDQ  D:  11/27/2012  T:  12/03/2012  Job:  [id number]

## 2012-12-31 ENCOUNTER — Telehealth: Payer: Self-pay | Admitting: Hematology & Oncology

## 2012-12-31 NOTE — Telephone Encounter (Signed)
Left message moved 11-5 to 11-26

## 2013-01-08 ENCOUNTER — Other Ambulatory Visit: Payer: Medicare Other | Admitting: Lab

## 2013-01-08 ENCOUNTER — Ambulatory Visit: Payer: Medicare Other | Admitting: Hematology & Oncology

## 2013-01-29 ENCOUNTER — Ambulatory Visit (HOSPITAL_BASED_OUTPATIENT_CLINIC_OR_DEPARTMENT_OTHER): Payer: Medicare Other | Admitting: Hematology & Oncology

## 2013-01-29 ENCOUNTER — Telehealth: Payer: Self-pay | Admitting: Hematology & Oncology

## 2013-01-29 ENCOUNTER — Other Ambulatory Visit (HOSPITAL_BASED_OUTPATIENT_CLINIC_OR_DEPARTMENT_OTHER): Payer: Medicare Other | Admitting: Lab

## 2013-01-29 VITALS — BP 192/84 | HR 79 | Temp 98.6°F | Resp 14 | Ht 64.0 in | Wt 174.0 lb

## 2013-01-29 DIAGNOSIS — C494 Malignant neoplasm of connective and soft tissue of abdomen: Secondary | ICD-10-CM

## 2013-01-29 DIAGNOSIS — C49A0 Gastrointestinal stromal tumor, unspecified site: Secondary | ICD-10-CM

## 2013-01-29 LAB — COMPREHENSIVE METABOLIC PANEL WITH GFR
ALT: <8 U/L (ref 0–35)
AST: 17 U/L (ref 0–37)
Albumin: 4.5 g/dL (ref 3.5–5.2)
Alkaline Phosphatase: 54 U/L (ref 39–117)
BUN: 17 mg/dL (ref 6–23)
CO2: 23 meq/L (ref 19–32)
Calcium: 8.8 mg/dL (ref 8.4–10.5)
Chloride: 108 meq/L (ref 96–112)
Creatinine, Ser: 1 mg/dL (ref 0.50–1.10)
Glucose, Bld: 70 mg/dL (ref 70–99)
Potassium: 3.8 meq/L (ref 3.5–5.3)
Sodium: 139 meq/L (ref 135–145)
Total Bilirubin: 0.4 mg/dL (ref 0.3–1.2)
Total Protein: 6.6 g/dL (ref 6.0–8.3)

## 2013-01-29 LAB — CBC WITH DIFFERENTIAL (CANCER CENTER ONLY)
BASO#: 0 10*3/uL (ref 0.0–0.2)
EOS%: 6.6 % (ref 0.0–7.0)
HGB: 10.9 g/dL — ABNORMAL LOW (ref 11.6–15.9)
LYMPH#: 1.3 10*3/uL (ref 0.9–3.3)
MCH: 24.9 pg — ABNORMAL LOW (ref 26.0–34.0)
MCHC: 32.1 g/dL (ref 32.0–36.0)
MONO%: 14.2 % — ABNORMAL HIGH (ref 0.0–13.0)
NEUT#: 1.3 10*3/uL — ABNORMAL LOW (ref 1.5–6.5)
Platelets: 174 10*3/uL (ref 145–400)
RBC: 4.37 10*6/uL (ref 3.70–5.32)

## 2013-01-29 NOTE — Progress Notes (Signed)
This office note has been dictated.

## 2013-01-29 NOTE — Telephone Encounter (Signed)
I called and spoke with Shannon Terry in radiology and sch CT scan for 03/12/12.  Apt calendar was mailed out to patient's home

## 2013-02-09 NOTE — Progress Notes (Signed)
CC:   Dibas Koirala, MD  DIAGNOSIS:  Rectal gastrointestinal stromal tumor.  CURRENT THERAPY:  Gleevec 400 mg p.o. daily.  INTERIM HISTORY:  Shannon Terry comes in for followup.  She continues to do fairly well.  She has continued to gain weight.  She has had no problems with rectal pain.  She has a little bit of rectal itching.  I told her to try some Preparation H.  She has had no abdominal pain.  There is no cough or shortness of breath.  She has had no fevers, sweats, or chills.  There has been no leg swelling.  She has had no rashes.  PHYSICAL EXAMINATION:  General:  This is a well-developed, well- nourished [place] American female, in no obvious distress.  Vital Signs: Temperature of 98.6, pulse 79, respiratory rate 14, blood pressure 192/84.  Weight is 174 pounds.  Head and Neck Exam:  Normocephalic, atraumatic skull.  There are no ocular or oral lesions.  There are no palpable cervical or supraclavicular lymph nodes.  Lungs:  Clear bilaterally.  Cardiac Exam:  Regular rate and rhythm with a normal S1, S2.  There are no murmurs, rubs, or bruits.  Abdomen:  Soft.  She has good bowel sounds.  There is no fluid wave.  There is no palpable abdominal mass.  There is no palpable hepatosplenomegaly.  Back Exam: No tenderness over the spine, ribs, or hips.  Extremities:  No clubbing, cyanosis, or edema.  Neurological:  No focal neurological deficits.  LABORATORY STUDIES:  White cell count is 3.3, hemoglobin 10.9, hematocrit 34, platelet count 174.  Her electrolytes all looked within normal limits.  She had normal liver function tests.  IMPRESSION:  Shannon Terry is a very charming 72 year old African American female.  She has a rectal gastrointestinal stromal tumor.  She is responding nicely to [facility].  Her tumor continues to shrink down.  She is now out almost 11 months from initial diagnosis.  I think she was diagnosed back in January of 2014.  We will go ahead and follow up with  another CT scan in January. Hopefully, we will continue to see her disease shrinking.  We will plan to see her back after a CT scan is done.    ______________________________ Volanda Napoleon, M.D. PRE/MEDQ  D:  01/29/2013  T:  02/08/2013  Job:  [id number]

## 2013-02-10 ENCOUNTER — Other Ambulatory Visit: Payer: Self-pay | Admitting: *Deleted

## 2013-02-10 DIAGNOSIS — R64 Cachexia: Secondary | ICD-10-CM

## 2013-02-10 DIAGNOSIS — C49A0 Gastrointestinal stromal tumor, unspecified site: Secondary | ICD-10-CM

## 2013-02-10 MED ORDER — IMATINIB MESYLATE 400 MG PO TABS: 400.0000 mg | ORAL_TABLET | Freq: Every day | ORAL | Status: DC

## 2013-02-10 NOTE — Progress Notes (Signed)
CC:   Dibas Koirala, MD  DIAGNOSES: 1. Rectal gastrointestinal stromal tumor. 2. Iron-deficiency anemia.  CURRENT THERAPY:  Gleevec 400 mg p.o. daily.  INTERIM HISTORY:  Ms. Deer comes in for followup.  She is doing okay. She has gained weight.  She has gained 15 pounds since we last saw her. She is taking the Megace on a more regular basis.  She is eating better.  We did go ahead and repeat her CT scan.  This was done on November 21, 2012.  The CT scan showed continued shrinkage of her rectal mass.  It now measures 6.3 x 5.3 x 5.2 cm.  No evidence of disease is noted elsewhere.  She has had no abdominal pain.  There is no melena or bright red blood per rectum.  She has had no cough or shortness of breath.  She has had no leg pain.  There has been no swelling of the legs.  She has had no rashes.  Overall, her performance status is ECOG 1.  PHYSICAL EXAMINATION:  On physical exam, this is a fairly well- developed, well-nourished [place] American female in no obvious distress.  Vital signs show temperature of 98.5, pulse 70, respiratory rate 16, blood pressure 178/97, weight is 171.  Head and neck exam shows a normocephalic, atraumatic skull.  There are no ocular or oral lesions. There are no palpable cervical or supraclavicular lymph nodes.  Lungs are clear bilaterally.  Cardiac exam:  Regular rate and rhythm with a normal S1 and S2.  There are no murmurs, rubs, or bruits.  Abdomen is soft.  She has good bowel sounds.  There is no fluid wave.  No palpable hepatosplenomegaly is noted.  Back exam:  No tenderness over the spine, ribs, or hips.  Extremities show no clubbing, cyanosis, or edema. Neurological exam shows no focal neurological deficits.  LABORATORY STUDIES:  White cell count is 4, hemoglobin 10.3, hematocrit 31.7, platelet count 194.  IMPRESSION:  Ms. Lou is a very charming 72 year old African American female with rectal gastrointestinal stromal tumor.  She  presented, I think, back in January of this year.  She is on Gleevec.  She is responding.  We will go ahead and get her back in 6 weeks.  We will plan for another scan in December.   ______________________________ Volanda Napoleon, M.D. PRE/MEDQ  D:  11/27/2012  T:  02/09/2013  Job:  [id number]

## 2013-02-10 NOTE — Telephone Encounter (Signed)
Pt is renewing her Ec[employer] for [employer]. Will have Dr Marin Olp sign the form and fax a rx to go along with it to [phone].

## 2013-02-11 ENCOUNTER — Telehealth: Payer: Self-pay | Admitting: Hematology & Oncology

## 2013-02-11 NOTE — Telephone Encounter (Signed)
Amy faxed [employer] Assistance application to: [phone] on 02/10/2013  P: [phone]   COPY SCANNED

## 2013-03-12 ENCOUNTER — Ambulatory Visit (HOSPITAL_COMMUNITY)
Admission: RE | Admit: 2013-03-12 | Discharge: 2013-03-12 | Disposition: A | Discharge status: Home / Self Care | Payer: Medicare Other | Source: Ambulatory Visit | Attending: Hematology & Oncology | Admitting: Hematology & Oncology

## 2013-03-12 DIAGNOSIS — M948X9 Other specified disorders of cartilage, unspecified sites: Secondary | ICD-10-CM | POA: Diagnosis not present

## 2013-03-12 DIAGNOSIS — C49A0 Gastrointestinal stromal tumor, unspecified site: Secondary | ICD-10-CM

## 2013-03-12 DIAGNOSIS — C20 Malignant neoplasm of rectum: Secondary | ICD-10-CM | POA: Diagnosis not present

## 2013-03-12 DIAGNOSIS — K449 Diaphragmatic hernia without obstruction or gangrene: Secondary | ICD-10-CM | POA: Diagnosis not present

## 2013-03-12 DIAGNOSIS — D1779 Benign lipomatous neoplasm of other sites: Secondary | ICD-10-CM | POA: Diagnosis not present

## 2013-03-12 DIAGNOSIS — C494 Malignant neoplasm of connective and soft tissue of abdomen: Secondary | ICD-10-CM | POA: Diagnosis not present

## 2013-03-12 DIAGNOSIS — K5730 Diverticulosis of large intestine without perforation or abscess without bleeding: Secondary | ICD-10-CM | POA: Diagnosis not present

## 2013-03-12 DIAGNOSIS — I517 Cardiomegaly: Secondary | ICD-10-CM | POA: Insufficient documentation

## 2013-03-12 MED ORDER — IOHEXOL 300 MG/ML  SOLN
100.0000 mL | Freq: Once | INTRAMUSCULAR | Status: AC | PRN
  Administered 2013-03-12: 100 mL via INTRAVENOUS

## 2013-03-13 ENCOUNTER — Telehealth: Payer: Self-pay | Admitting: *Deleted

## 2013-03-13 NOTE — Telephone Encounter (Addendum)
Message copied by Orlando Penner on Thu Mar 13, 2013 12:20 PM ------      Message from: Burney Gauze R      Created: Wed Mar 12, 2013  5:54 PM       Call - rectal mass is about the same!!!  Laurey Arrow ------This message given to pt's wife.  Voiced understanding.

## 2013-03-31 ENCOUNTER — Encounter: Payer: Self-pay | Admitting: Hematology & Oncology

## 2013-03-31 ENCOUNTER — Ambulatory Visit (HOSPITAL_BASED_OUTPATIENT_CLINIC_OR_DEPARTMENT_OTHER): Payer: Medicare Other | Admitting: Hematology & Oncology

## 2013-03-31 ENCOUNTER — Other Ambulatory Visit (HOSPITAL_BASED_OUTPATIENT_CLINIC_OR_DEPARTMENT_OTHER): Payer: Medicare Other | Admitting: Lab

## 2013-03-31 VITALS — BP 196/93 | HR 75 | Temp 98.2°F | Resp 14 | Ht 65.0 in | Wt 175.0 lb

## 2013-03-31 DIAGNOSIS — C494 Malignant neoplasm of connective and soft tissue of abdomen: Secondary | ICD-10-CM | POA: Diagnosis not present

## 2013-03-31 DIAGNOSIS — C49A0 Gastrointestinal stromal tumor, unspecified site: Secondary | ICD-10-CM

## 2013-03-31 LAB — CBC WITH DIFFERENTIAL (CANCER CENTER ONLY)
BASO#: 0 10*3/uL (ref 0.0–0.2)
BASO%: 0.3 % (ref 0.0–2.0)
EOS ABS: 0.2 10*3/uL (ref 0.0–0.5)
EOS%: 4.1 % (ref 0.0–7.0)
HEMATOCRIT: 32.6 % — AB (ref 34.8–46.6)
HEMOGLOBIN: 10.4 g/dL — AB (ref 11.6–15.9)
LYMPH#: 1.7 10*3/uL (ref 0.9–3.3)
LYMPH%: 41.8 % (ref 14.0–48.0)
MCH: 24.3 pg — ABNORMAL LOW (ref 26.0–34.0)
MCHC: 31.9 g/dL — ABNORMAL LOW (ref 32.0–36.0)
MCV: 76 fL — AB (ref 81–101)
MONO#: 0.6 10*3/uL (ref 0.1–0.9)
MONO%: 13.9 % — ABNORMAL HIGH (ref 0.0–13.0)
NEUT#: 1.6 10*3/uL (ref 1.5–6.5)
NEUT%: 39.9 % (ref 39.6–80.0)
Platelets: ADEQUATE 10*3/uL (ref 145–400)
RBC: 4.28 10*6/uL (ref 3.70–5.32)
RDW: 15.9 % — ABNORMAL HIGH (ref 11.1–15.7)
WBC: 4 10*3/uL (ref 3.9–10.0)

## 2013-03-31 LAB — COMPREHENSIVE METABOLIC PANEL
ALT: <8 U/L (ref 0–35)
AST: 15 U/L (ref 0–37)
Albumin: 4.1 g/dL (ref 3.5–5.2)
Alkaline Phosphatase: 59 U/L (ref 39–117)
BILIRUBIN TOTAL: 0.4 mg/dL (ref 0.3–1.2)
BUN: 15 mg/dL (ref 6–23)
CALCIUM: 8.8 mg/dL (ref 8.4–10.5)
CO2: 23 meq/L (ref 19–32)
CREATININE: 1.04 mg/dL (ref 0.50–1.10)
Chloride: 113 mEq/L — ABNORMAL HIGH (ref 96–112)
GLUCOSE: 76 mg/dL (ref 70–99)
Potassium: 4.1 mEq/L (ref 3.5–5.3)
Sodium: 142 mEq/L (ref 135–145)
TOTAL PROTEIN: 6.3 g/dL (ref 6.0–8.3)

## 2013-03-31 NOTE — Progress Notes (Signed)
This office note has been dictated.

## 2013-04-01 NOTE — Progress Notes (Signed)
DIAGNOSIS:  Rectal gastrointestinal stromal tumors.  CURRENT THERAPY:  Gleevec 400 mg p.o. daily.  INTERIM HISTORY:  Shannon Terry comes in for followup.  She is doing quite well.  We last saw her back in November.  She had a CT scan done in early January.  This basically showed stability of her rectal GIST.  It measures 6.5 x 5.8 cm.  No other abnormalities were noted within the abdomen or pelvis.  She is having no rectal pain.  There is no rectal bleeding.  She is having no problems with constipation or diarrhea.  There is no abdominal pain.  She has had no cough.  There has been no leg swelling.  She has had no rashes.  Overall, her performance status is ECOG 1.  PHYSICAL EXAMINATION:  General:  This is a fairly well-developed, well- nourished African-American female in no obvious distress.  Vital Signs: Temperature of 98.2, pulse 75, respiratory rate 18, blood pressure 196/93, weight is 175 pounds.  Head and Neck:  Normocephalic, atraumatic skull.  There are no ocular or oral lesions.  There are no palpable cervical or supraclavicular lymph nodes.  Lungs are clear bilaterally. Cardiac:  Regular rate and rhythm with a normal S1 and S2.  There are no murmurs, rubs, or bruits.  Abdomen:  Soft.  She has good bowel sounds. There is no fluid wave.  There is no palpable abdominal mass.  No palpable hepatosplenomegaly.  Back:  No tenderness over the spine, ribs, or hips.  Extremities:  No clubbing, cyanosis, or edema.  She may have some slight trace nonpitting edema of the ankles and feet.  Skin:  No rashes.  Neurological:  No focal neurological deficits.  LABORATORY STUDIES:  White cell count is 4, hemoglobin 10.4, hematocrit 32.6, platelet count is clumped.  IMPRESSION:  Ms. Upright is very charming 73 year old African-American female.  It has been a year and ever since she had the gastrointestinal stromal tumors.  Again, she has done incredibly well with this.  We might be seeing some  signs of resistance right now.  I do want to get a followup CT scan on her in about 6 weeks now.  I really want to make sure that we stay on top of the changes.  If we found that she is progressing, then we can either increase her Gleevec dose or possibly switch over to Sutent.  We will get the CT scan at the end of February.  We will do the CT scan the same day that I see her.  I spent a good half-hour with Ms. Garbarini and her family.  I explained what was going on and why we had to do the CT scan earlier.    ______________________________ Volanda Napoleon, M.D. PRE/MEDQ  D:  03/31/2013  T:  04/01/2013  Job:  [id number]

## 2013-04-30 ENCOUNTER — Other Ambulatory Visit (HOSPITAL_BASED_OUTPATIENT_CLINIC_OR_DEPARTMENT_OTHER): Payer: Medicare Other | Admitting: Lab

## 2013-04-30 ENCOUNTER — Ambulatory Visit (HOSPITAL_BASED_OUTPATIENT_CLINIC_OR_DEPARTMENT_OTHER): Payer: Medicare Other | Admitting: Hematology & Oncology

## 2013-04-30 ENCOUNTER — Ambulatory Visit (HOSPITAL_BASED_OUTPATIENT_CLINIC_OR_DEPARTMENT_OTHER)
Admission: RE | Admit: 2013-04-30 | Discharge: 2013-04-30 | Disposition: A | Discharge status: Home / Self Care | Payer: Medicare Other | Source: Ambulatory Visit | Attending: Hematology & Oncology | Admitting: Hematology & Oncology

## 2013-04-30 ENCOUNTER — Encounter: Payer: Self-pay | Admitting: Hematology & Oncology

## 2013-04-30 ENCOUNTER — Ambulatory Visit: Payer: Medicare Other

## 2013-04-30 VITALS — BP 188/94 | HR 88 | Temp 98.6°F | Resp 14 | Ht 64.0 in | Wt 174.0 lb

## 2013-04-30 DIAGNOSIS — I10 Essential (primary) hypertension: Secondary | ICD-10-CM

## 2013-04-30 DIAGNOSIS — K449 Diaphragmatic hernia without obstruction or gangrene: Secondary | ICD-10-CM | POA: Diagnosis not present

## 2013-04-30 DIAGNOSIS — C494 Malignant neoplasm of connective and soft tissue of abdomen: Secondary | ICD-10-CM

## 2013-04-30 DIAGNOSIS — Z85048 Personal history of other malignant neoplasm of rectum, rectosigmoid junction, and anus: Secondary | ICD-10-CM | POA: Diagnosis not present

## 2013-04-30 DIAGNOSIS — C49A0 Gastrointestinal stromal tumor, unspecified site: Secondary | ICD-10-CM

## 2013-04-30 DIAGNOSIS — C20 Malignant neoplasm of rectum: Secondary | ICD-10-CM | POA: Diagnosis not present

## 2013-04-30 LAB — CMP (CANCER CENTER ONLY)
ALBUMIN: 4 g/dL (ref 3.3–5.5)
ALT: 9 U/L — AB (ref 10–47)
AST: 23 U/L (ref 11–38)
Alkaline Phosphatase: 60 U/L (ref 26–84)
BUN, Bld: 16 mg/dL (ref 7–22)
CALCIUM: 8.6 mg/dL (ref 8.0–10.3)
CHLORIDE: 108 meq/L (ref 98–108)
CO2: 26 meq/L (ref 18–33)
CREATININE: 1.1 mg/dL (ref 0.6–1.2)
Glucose, Bld: 79 mg/dL (ref 73–118)
POTASSIUM: 3.5 meq/L (ref 3.3–4.7)
Sodium: 140 mEq/L (ref 128–145)
TOTAL PROTEIN: 6.8 g/dL (ref 6.4–8.1)
Total Bilirubin: 0.6 mg/dl (ref 0.20–1.60)

## 2013-04-30 LAB — CBC WITH DIFFERENTIAL (CANCER CENTER ONLY)
BASO#: 0 10*3/uL (ref 0.0–0.2)
BASO%: 0.3 % (ref 0.0–2.0)
EOS ABS: 0.1 10*3/uL (ref 0.0–0.5)
EOS%: 3.1 % (ref 0.0–7.0)
HEMATOCRIT: 32.1 % — AB (ref 34.8–46.6)
HGB: 10.3 g/dL — ABNORMAL LOW (ref 11.6–15.9)
LYMPH#: 1.5 10*3/uL (ref 0.9–3.3)
LYMPH%: 37.9 % (ref 14.0–48.0)
MCH: 24.5 pg — ABNORMAL LOW (ref 26.0–34.0)
MCHC: 32.1 g/dL (ref 32.0–36.0)
MCV: 76 fL — AB (ref 81–101)
MONO#: 0.5 10*3/uL (ref 0.1–0.9)
MONO%: 13.9 % — ABNORMAL HIGH (ref 0.0–13.0)
NEUT#: 1.7 10*3/uL (ref 1.5–6.5)
NEUT%: 44.8 % (ref 39.6–80.0)
PLATELETS: 202 10*3/uL (ref 145–400)
RBC: 4.2 10*6/uL (ref 3.70–5.32)
RDW: 16 % — ABNORMAL HIGH (ref 11.1–15.7)
WBC: 3.9 10*3/uL (ref 3.9–10.0)

## 2013-04-30 LAB — LACTATE DEHYDROGENASE: LDH: 227 U/L (ref 94–250)

## 2013-04-30 MED ORDER — IOHEXOL 300 MG/ML  SOLN
100.0000 mL | Freq: Once | INTRAMUSCULAR | Status: AC | PRN
  Administered 2013-04-30: 100 mL via INTRAVENOUS

## 2013-04-30 MED ORDER — TRIAMTERENE-HCTZ 37.5-25 MG PO TABS: 1.0000 | ORAL_TABLET | Freq: Every day | ORAL | Status: DC

## 2013-04-30 MED ORDER — IMATINIB MESYLATE 400 MG PO TABS: 800.0000 mg | ORAL_TABLET | Freq: Every day | ORAL | Status: DC

## 2013-04-30 NOTE — Patient Instructions (Signed)
CT Scan A CT scan (computerized axial tomography, CAT scan) is a specialized, non-invasive X-ray picture. It uses X-rays and a computer to make pictures of different areas of your body. They can offer more detailed information than a regular X-ray exam. The CT scan provides data about internal organs, soft tissue structures, blood vessels, as well as bones. CT scans have many benefits which may include fast, painless, and accurate details of a patient's anatomy. The CT scanner is a large machine that has an opening through which your body moves through to take the pictures. A CT scan takes several minutes.  LET YOUR CAREGIVER KNOW IF:  You are pregnant or might be pregnant.  You have an allergy to any medicine.  You are allergic to any foods.  You are allergic to iodine or X-ray dye.  You have high blood pressure, kidney problems, diabetes, or asthma.  You are taking medicine for diabetes.  You have had a previous CT scan. It is important to limit unnecessary testing. RISKS AND COMPLICATIONS  There are a few risks associated with CT scans. The risks may include:  An allergic reaction to contrast material.  Loose bowel movements after drinking the contrast material.  Cancer from excessive exposure to radiation. BEFORE THE PROCEDURE   You may need to drink a liquid or have IV contrast material injected to complete the exam. The IV contrast is needed to check the blood supply of the area being studied. The IV contrast is generally very safe.  Your caregiver may check lab tests before giving IV contrast. PROCEDURE   You will lie on a moving table.  You must remain still during the exam.  It may be necessary for you to hold your breath for 5 to 10 seconds on occasion, as instructed by the technician doing the scan.  Multiple pictures will be made during a typical CT scan. AFTER THE PROCEDURE   Nursing mothers should not breastfeed their babies for 24 hours after having a CT  scan.  Patients taking certain medicines, such as metformin, may be asked to have a follow-up blood test before going back on their medicine after receiving IV contrast.  Your results will be sent to a lab for examination. Finding out the results of your test Not all test results are available during your visit. If your test results are not back during the visit, make an appointment with your caregiver to find out the results. Do not assume everything is normal if you have not heard from your caregiver or the medical facility. It is important for you to follow up on all of your test results. Document Released: 03/30/2004 Document Revised: 05/15/2011 Document Reviewed: 10/28/2012 [facility] Patient Information 2014 [place].

## 2013-05-01 NOTE — Progress Notes (Signed)
  DIAGNOSIS: Rectal GIST Chronic iron deficiency anemia  CURRENT THERAPY: Gleevec 400 mg by mouth daily. Dose to be increased to 800 mg by mouth daily  INTERIM HISTORY:  Mrs. Gleich comes in for followup. We did go ahead and repeat a CT scan of the abdomen and pelvis. This shows that her rectal GIST is is stable in size. It measures 6.4 x 5.6 cm.    She is not complaining of any rectal pain. She is having a problem with her bowels. There's been no problems with nausea vomiting.    Unfortunately, she does not have a family doctor. She really does not do anything for blood pressure. At that we need to work on getting her family doctor.    Is no cough. No chest pain. She has a good appetite. She's not noticed any bleeding.   PHYSICAL EXAMINATION:  Well-developed well-nourished African American female in no obvious distress. Vital signs show a temperature of 98.6. Pulse 88. Blood pressure 188/94. Weight is 174 pounds. Head and neck exam shows no ocular or oral lesions. She has no palpable cervical or supraclavicular lymph nodes. Lungs are clear. Cardiac exam regular rate and rhythm with no murmurs rubs or bruits. Abdomen is soft. Has good bowel sounds. There is no fluid wave. There is no guarding or rebound tenderness. There is no palpable hepato- splenomegaly extremities shows no clubbing cyanosis or edema. Has good strength in her legs. She has good range of motion of her joints. Skin exam no rashes. Neurological exam no focal neurological deficits.   LABORATORY STUDIES:  White cell count  3.9. Hemoglobin 10.3. Platelet count 202. MCV is 76. LDH is 227.   IMPRESSION:  Mrs. Fravel is a 73 year old African American female with a gastro-intestinal stromal tumor. This is of her rectum. This is quite unusual. She's had now for over a year. She is stop responding to 400 mg of Gleevec. As such, I think we should increase her dose to 800 mg. Other this would be reasonable and probably the  recommended course of action.    I think she really needs to see a family doctor. Her blood pressures at ridiculous. I put her on some Maxide at a temporary means of helping her blood pressure. We will see if she will go to Dr. Maryellen Pile [place]. We will make an appointment.    I want to see her back in one month as we have changed her dosage to measures she is doing okay.    I spent a good 40 minutes or so with her and her husband explaining the change in why we need to do it. I also went over them to visit her that she must see a family doctor.   Volanda Napoleon, MD 05/01/2013

## 2013-05-02 ENCOUNTER — Other Ambulatory Visit: Payer: Self-pay | Admitting: Nurse Practitioner

## 2013-05-02 NOTE — Telephone Encounter (Signed)
Called in a revised rx for Gleevec 800mg  caps for patient to [facility] and spoke to [place], [place], D whom confirmed the script.

## 2013-05-16 ENCOUNTER — Encounter: Payer: Self-pay | Admitting: Hematology & Oncology

## 2013-05-16 ENCOUNTER — Other Ambulatory Visit (HOSPITAL_BASED_OUTPATIENT_CLINIC_OR_DEPARTMENT_OTHER): Payer: Medicare Other | Admitting: Lab

## 2013-05-16 ENCOUNTER — Ambulatory Visit (HOSPITAL_BASED_OUTPATIENT_CLINIC_OR_DEPARTMENT_OTHER): Payer: Medicare Other | Admitting: Hematology & Oncology

## 2013-05-16 VITALS — BP 163/74 | HR 74 | Temp 98.3°F | Resp 14 | Ht 65.0 in | Wt 171.0 lb

## 2013-05-16 DIAGNOSIS — C499 Malignant neoplasm of connective and soft tissue, unspecified: Secondary | ICD-10-CM

## 2013-05-16 DIAGNOSIS — C49A0 Gastrointestinal stromal tumor, unspecified site: Secondary | ICD-10-CM

## 2013-05-16 DIAGNOSIS — I10 Essential (primary) hypertension: Secondary | ICD-10-CM

## 2013-05-16 LAB — CMP (CANCER CENTER ONLY)
ALT: 18 U/L (ref 10–47)
AST: 26 U/L (ref 11–38)
Albumin: 4.3 g/dL (ref 3.3–5.5)
Alkaline Phosphatase: 65 U/L (ref 26–84)
BUN, Bld: 22 mg/dL (ref 7–22)
CALCIUM: 8.6 mg/dL (ref 8.0–10.3)
CHLORIDE: 106 meq/L (ref 98–108)
CO2: 24 meq/L (ref 18–33)
Creat: 1.5 mg/dl — ABNORMAL HIGH (ref 0.6–1.2)
Glucose, Bld: 70 mg/dL — ABNORMAL LOW (ref 73–118)
Potassium: 4.2 mEq/L (ref 3.3–4.7)
SODIUM: 141 meq/L (ref 128–145)
TOTAL PROTEIN: 7.5 g/dL (ref 6.4–8.1)
Total Bilirubin: 0.6 mg/dl (ref 0.20–1.60)

## 2013-05-16 LAB — CBC WITH DIFFERENTIAL (CANCER CENTER ONLY)
BASO#: 0 10*3/uL (ref 0.0–0.2)
BASO%: 0.7 % (ref 0.0–2.0)
EOS%: 6 % (ref 0.0–7.0)
Eosinophils Absolute: 0.3 10*3/uL (ref 0.0–0.5)
HCT: 34.7 % — ABNORMAL LOW (ref 34.8–46.6)
HGB: 11.3 g/dL — ABNORMAL LOW (ref 11.6–15.9)
LYMPH#: 1.9 10*3/uL (ref 0.9–3.3)
LYMPH%: 45.1 % (ref 14.0–48.0)
MCH: 24.6 pg — ABNORMAL LOW (ref 26.0–34.0)
MCHC: 32.6 g/dL (ref 32.0–36.0)
MCV: 75 fL — ABNORMAL LOW (ref 81–101)
MONO#: 0.4 10*3/uL (ref 0.1–0.9)
MONO%: 10.5 % (ref 0.0–13.0)
NEUT%: 37.7 % — ABNORMAL LOW (ref 39.6–80.0)
NEUTROS ABS: 1.6 10*3/uL (ref 1.5–6.5)
PLATELETS: 215 10*3/uL (ref 145–400)
RBC: 4.6 10*6/uL (ref 3.70–5.32)
RDW: 15.5 % (ref 11.1–15.7)
WBC: 4.2 10*3/uL (ref 3.9–10.0)

## 2013-05-16 NOTE — Progress Notes (Signed)
Hematology and Oncology Follow Up Visit  Shannon Terry [id number] 19-Jul-1940 73 y.o. 05/16/2013   Principle Diagnosis:  Rectal GIST Current Therapy:    Gleevec 800 mg by mouth daily     Interim History:  Ms.  Terry is back for followup. When we last saw her, and I made a dosage adjustment with her Gleevec. I do this because her rectal stromal tumor was no longer responding to the 400 milligram dose. So far count she's had no problems with the 800 mg dose. She's had no diarrhea. She is a little bit more fatigue. I told her to try to take the dosage after dinner. This may help her rest. There is no cough. There is no shortness of breath. She's had no rashes. There is no headache.  We last saw her, her blood pressure was quite high. She does not have a family doctor. As such, we are trying to help with her blood pressure.  Her appetite is good. She's had no nausea or vomiting.  Medications: Current outpatient prescriptions:furosemide (LASIX) 40 MG tablet, Take 20 mg by mouth as needed. As needed, Disp: , Rfl: ;  imatinib (GLEEVEC) 400 MG tablet, Take 2 tablets (800 mg total) by mouth daily. Take with meals and large glass of water.Caution:Chemotherapy., Disp: 60 tablet, Rfl: 6;  megestrol (MEGACE ORAL) 40 MG/ML suspension, Take 20 mLs (800 mg total) by mouth daily., Disp: 480 mL, Rfl: 4 triamterene-hydrochlorothiazide (MAXZIDE-25) 37.5-25 MG per tablet, Take 1 tablet by mouth daily., Disp: 30 tablet, Rfl: 1;  oxyCODONE-acetaminophen (PERCOCET/ROXICET) 5-325 MG per tablet, Take 1 tablet by mouth every 4 (four) hours as needed for pain., Disp: 90 tablet, Rfl: 0  Allergies: No Known Allergies  Past Medical History, Surgical history, Social history, and Family History were reviewed and updated.  Review of Systems: As above  Physical Exam:  height is 5\' 5"  (1.651 m) and weight is 171 lb (77.565 kg). Her oral temperature is 98.3 F (36.8 C). Her blood pressure is 163/74 and her pulse is 74. Her  respiration is 14.   Well-developed well-nourished African American female. Head and neck exam shows no oral lesions. She has no ocular lesions. Pupils react appropriately. Neck is supple with no adenopathy. Lungs are clear. Cardiac exam regular in rhythm with no murmurs rubs or bruits. Abdomen soft. Good bowel sounds. There is no fluid wave. There is no palpable hepato- splenomegaly megaly. Extremities no clubbing cyanosis or edema. She has good range of motion of her joints. Skin exam no rashes ecchymoses or petechia. Neurological exam no focal neurological deficits.  Lab Results  Component Value Date   WBC 4.2 05/16/2013   HGB 11.3* 05/16/2013   HCT 34.7* 05/16/2013   MCV 75* 05/16/2013   PLT 215 05/16/2013     Chemistry      Component Value Date/Time   NA 141 05/16/2013 1509   NA 142 03/31/2013 1059   K 4.2 05/16/2013 1509   K 4.1 03/31/2013 1059   CL 106 05/16/2013 1509   CL 113* 03/31/2013 1059   CO2 24 05/16/2013 1509   CO2 23 03/31/2013 1059   BUN 22 05/16/2013 1509   BUN 15 03/31/2013 1059   CREATININE 1.5* 05/16/2013 1509   CREATININE 1.04 03/31/2013 1059      Component Value Date/Time   CALCIUM 8.6 05/16/2013 1509   CALCIUM 8.8 03/31/2013 1059   ALKPHOS 65 05/16/2013 1509   ALKPHOS 59 03/31/2013 1059   AST 26 05/16/2013 1509   AST 15  03/31/2013 1059   ALT 18 05/16/2013 1509   ALT <8 03/31/2013 1059   BILITOT 0.60 05/16/2013 1509   BILITOT 0.4 03/31/2013 1059         Impression and Plan: Shannon Terry is a 73 year old African [place] female. She has a rectal GIST. She has been on regular dose Gleevec for over a year. She has stable disease. I I felt that we had to make a dosage adjustment so that we can see if we could not get a better response. I just do not want her to have tumor progression that was going to cause GI bleeding, rectal pain, and a possible obstruction.  So far, she tolerated the dose of 800 mg quite well.  I reviewed her lab work with her. I again went over the side  effects of Gleevec that we have to watch out for. I spent a good 25 minutes with she and her husband.  Want to see her back in 4 or 5 weeks now.  Volanda Napoleon, MD 3/13/20154:07 PM

## 2013-06-05 ENCOUNTER — Telehealth: Payer: Self-pay | Admitting: *Deleted

## 2013-06-05 NOTE — Telephone Encounter (Signed)
Daughter Shannon Terry called stating that her mom had stopped taking Gleevec x 15 days because could not tolerate the higher dosage.  Dr. Marin Olp wants patient to alternate one with 2 tablets .  Daughter can mange this she states.  To see Dr. Marin Olp later in month.

## 2013-06-20 ENCOUNTER — Encounter: Payer: Self-pay | Admitting: Hematology & Oncology

## 2013-06-20 ENCOUNTER — Ambulatory Visit (HOSPITAL_BASED_OUTPATIENT_CLINIC_OR_DEPARTMENT_OTHER): Payer: Medicare Other | Admitting: Hematology & Oncology

## 2013-06-20 ENCOUNTER — Other Ambulatory Visit (HOSPITAL_BASED_OUTPATIENT_CLINIC_OR_DEPARTMENT_OTHER): Payer: Medicare Other | Admitting: Lab

## 2013-06-20 VITALS — BP 202/104 | HR 83 | Temp 98.0°F | Resp 16 | Ht 65.0 in | Wt 172.0 lb

## 2013-06-20 DIAGNOSIS — D500 Iron deficiency anemia secondary to blood loss (chronic): Secondary | ICD-10-CM

## 2013-06-20 DIAGNOSIS — C494 Malignant neoplasm of connective and soft tissue of abdomen: Secondary | ICD-10-CM | POA: Diagnosis not present

## 2013-06-20 DIAGNOSIS — D509 Iron deficiency anemia, unspecified: Secondary | ICD-10-CM | POA: Diagnosis not present

## 2013-06-20 DIAGNOSIS — C49A0 Gastrointestinal stromal tumor, unspecified site: Secondary | ICD-10-CM

## 2013-06-20 LAB — CMP (CANCER CENTER ONLY)
ALK PHOS: 104 U/L — AB (ref 26–84)
ALT: 22 U/L (ref 10–47)
AST: 31 U/L (ref 11–38)
Albumin: 4 g/dL (ref 3.3–5.5)
BUN: 15 mg/dL (ref 7–22)
CHLORIDE: 106 meq/L (ref 98–108)
CO2: 31 mEq/L (ref 18–33)
CREATININE: 0.8 mg/dL (ref 0.6–1.2)
Calcium: 9.5 mg/dL (ref 8.0–10.3)
Glucose, Bld: 86 mg/dL (ref 73–118)
Potassium: 3.6 mEq/L (ref 3.3–4.7)
Sodium: 143 mEq/L (ref 128–145)
Total Bilirubin: 0.6 mg/dl (ref 0.20–1.60)
Total Protein: 7.7 g/dL (ref 6.4–8.1)

## 2013-06-20 LAB — CBC WITH DIFFERENTIAL (CANCER CENTER ONLY)
BASO#: 0 10*3/uL (ref 0.0–0.2)
BASO%: 0.5 % (ref 0.0–2.0)
EOS%: 5.6 % (ref 0.0–7.0)
Eosinophils Absolute: 0.2 10*3/uL (ref 0.0–0.5)
HEMATOCRIT: 37.1 % (ref 34.8–46.6)
HGB: 11.7 g/dL (ref 11.6–15.9)
LYMPH#: 1.4 10*3/uL (ref 0.9–3.3)
LYMPH%: 34.1 % (ref 14.0–48.0)
MCH: 24.3 pg — ABNORMAL LOW (ref 26.0–34.0)
MCHC: 31.5 g/dL — AB (ref 32.0–36.0)
MCV: 77 fL — AB (ref 81–101)
MONO#: 0.5 10*3/uL (ref 0.1–0.9)
MONO%: 12.1 % (ref 0.0–13.0)
NEUT#: 2 10*3/uL (ref 1.5–6.5)
NEUT%: 47.7 % (ref 39.6–80.0)
PLATELETS: 262 10*3/uL (ref 145–400)
RBC: 4.81 10*6/uL (ref 3.70–5.32)
RDW: 14.3 % (ref 11.1–15.7)
WBC: 4.1 10*3/uL (ref 3.9–10.0)

## 2013-06-20 LAB — LACTATE DEHYDROGENASE: LDH: 179 U/L (ref 94–250)

## 2013-06-22 NOTE — Progress Notes (Signed)
Hematology and Oncology Follow Up Visit  Shannon Terry [id number] May 27, 1940 73 y.o. 06/22/2013   Principle Diagnosis:   Rectal GIST  Iron deficiency anemia  Current Therapy:    Gleevec 800 mg by mouth daily     Interim History:  Ms.  Terry is back for a visit. She has not been taking the [place]. She says that it just makes her sick. I told her she could take one pill twice a day and not both pills at the same time. She says that she just is not going to take any more medicine. She does not wanting any more medicine for this.  I spent about 45 minutes or so with her. Her family was trying to convince her to take the medicine. I told her we can switch medicines. There are other options for her kind of cancer.  She says that she feels well. She is having no rectal pain. She's going to the bathroom okay. She just doesn't want to do anything else.  I told her that if her tumor grows, they can cause her a lot of pain then we probably would not be able to prevent. She understands this.  Again, her family did her best to try to convince her to take the [place] or try something different.  She says that she is just tired of taking medicines. Shannon Terry is not even taking blood pressure medicines. Her blood pressure  today was over 200.  She says that her appetite is doing okay. She's not having any nausea vomiting.  Is no cough. She's had no fever. There is no headache.  Medications: Current outpatient prescriptions:furosemide (LASIX) 40 MG tablet, Take 20 mg by mouth as needed. As needed, Disp: , Rfl: ;  imatinib (GLEEVEC) 400 MG tablet, Take 2 tablets (800 mg total) by mouth daily. Take with meals and large glass of water.Caution:Chemotherapy., Disp: 60 tablet, Rfl: 6;  megestrol (MEGACE ORAL) 40 MG/ML suspension, Take 20 mLs (800 mg total) by mouth daily., Disp: 480 mL, Rfl: 4 oxyCODONE-acetaminophen (PERCOCET/ROXICET) 5-325 MG per tablet, Take 1 tablet by mouth every 4 (four) hours as needed  for pain., Disp: 90 tablet, Rfl: 0;  triamterene-hydrochlorothiazide (MAXZIDE-25) 37.5-25 MG per tablet, Take 1 tablet by mouth daily., Disp: 30 tablet, Rfl: 1  Allergies: No Known Allergies  Past Medical History, Surgical history, Social history, and Family History were reviewed and updated.  Review of Systems: As above  Physical Exam:  height is 5\' 5"  (1.651 m) and weight is 172 lb (78.019 kg). Her oral temperature is 98 F (36.7 C). Her blood pressure is 202/104 and her pulse is 83. Her respiration is 16.   Fairly well-developed and well-nourished African [place] female. Head and neck exam shows no ocular or oral lesions. There is no adenopathy in the neck. Lungs are clear. Cardiac exam regular in rhythm. Abdomen is soft. Has good bowel sounds. There is no fluid wave. There is no palpable hepatosplenomegaly. Back exam no tenderness over the spine ribs or hips. Inguinal exam shows no adenopathy. Extremities shows some trace edema in her lower legs. She has good strength in her muscles. Has good range of motion of her joints. Neurological exam shows no focal neurological deficits. Skin exam no rashes ecchymosis or petechia.  Lab Results  Component Value Date   WBC 4.1 06/20/2013   HGB 11.7 06/20/2013   HCT 37.1 06/20/2013   MCV 77* 06/20/2013   PLT 262 06/20/2013     Chemistry  Component Value Date/Time   NA 143 06/20/2013 1052   NA 142 03/31/2013 1059   K 3.6 06/20/2013 1052   K 4.1 03/31/2013 1059   CL 106 06/20/2013 1052   CL 113* 03/31/2013 1059   CO2 31 06/20/2013 1052   CO2 23 03/31/2013 1059   BUN 15 06/20/2013 1052   BUN 15 03/31/2013 1059   CREATININE 0.8 06/20/2013 1052   CREATININE 1.04 03/31/2013 1059      Component Value Date/Time   CALCIUM 9.5 06/20/2013 1052   CALCIUM 8.8 03/31/2013 1059   ALKPHOS 104* 06/20/2013 1052   ALKPHOS 59 03/31/2013 1059   AST 31 06/20/2013 1052   AST 15 03/31/2013 1059   ALT 22 06/20/2013 1052   ALT <8 03/31/2013 1059   BILITOT 0.60 06/20/2013 1052    BILITOT 0.4 03/31/2013 1059         Impression and Plan: Shannon Terry is a 73 year old African American female with a rectal stromal tumor. She was diagnosed back in January of 2014. She's had a nice response to Gleevec at 400 mg. Her last scan seemed to show a slight increase in size. I increased her dose to 800 mg. She had a hard time with this. Again, told her to try taking the Gleevec twice a day. I also tried to tell her we had other medicines she could try.  Despite everybody's efforts, she is insistent that she not take any medicine now. I really cannot argue with this if she does not want to take medicines. She fully understands the consequences that it could happen. She understands and accepts that the cancer could grow and cause her irreversible problems, mostly pain.  I will get another scan when I see her back in a month. This will show [place] how things are looking.  Again, I spent over 45 minutes with she and her family. Her family was really helpful in trying to get her to take her [place].   Shannon Napoleon, MD 4/19/20154:02 PM

## 2013-06-25 ENCOUNTER — Telehealth: Payer: Self-pay | Admitting: Hematology & Oncology

## 2013-06-25 ENCOUNTER — Encounter: Payer: Self-pay | Admitting: Nurse Practitioner

## 2013-06-25 NOTE — Telephone Encounter (Signed)
FMLA papers completed and faxed to [place] @ [phone] for pt's dau Kathyrn Drown)       COPY SCANNED

## 2013-06-25 NOTE — Progress Notes (Signed)
FMLA forms completed and signed by Dr. Marin Olp. Given to Baxter Flattery for proper dispensing to appropriate person.

## 2013-06-30 ENCOUNTER — Telehealth: Payer: Self-pay | Admitting: Hematology & Oncology

## 2013-06-30 NOTE — Telephone Encounter (Signed)
Faxed Medical Records via fax today  to:  [place] - for Kathyrn Drown, Pt's daughter Attn: Zebedee Iba   Fx: [phone]     COPY SCANNED

## 2013-07-11 ENCOUNTER — Ambulatory Visit: Payer: Medicare Other | Admitting: Hematology & Oncology

## 2013-07-11 ENCOUNTER — Telehealth: Payer: Self-pay | Admitting: Hematology & Oncology

## 2013-07-11 ENCOUNTER — Other Ambulatory Visit: Payer: Medicare Other | Admitting: Lab

## 2013-07-11 ENCOUNTER — Ambulatory Visit (HOSPITAL_BASED_OUTPATIENT_CLINIC_OR_DEPARTMENT_OTHER): Payer: Medicare Other

## 2013-07-11 NOTE — Telephone Encounter (Signed)
Pt was no show for all 5-8 appointments. I left message to call and reschedule

## 2013-08-11 ENCOUNTER — Ambulatory Visit: Payer: Medicare Other | Admitting: Family Medicine

## 2013-08-11 DIAGNOSIS — Z0289 Encounter for other administrative examinations: Secondary | ICD-10-CM

## 2014-01-05 ENCOUNTER — Encounter: Payer: Self-pay | Admitting: Hematology & Oncology

## 2014-04-14 ENCOUNTER — Other Ambulatory Visit: Payer: Self-pay | Admitting: Family

## 2014-06-14 ENCOUNTER — Encounter (HOSPITAL_COMMUNITY): Payer: Self-pay | Admitting: Emergency Medicine

## 2014-06-14 DIAGNOSIS — R51 Headache: Secondary | ICD-10-CM | POA: Diagnosis not present

## 2014-06-14 DIAGNOSIS — K6289 Other specified diseases of anus and rectum: Secondary | ICD-10-CM | POA: Diagnosis not present

## 2014-06-14 DIAGNOSIS — E669 Obesity, unspecified: Secondary | ICD-10-CM | POA: Diagnosis not present

## 2014-06-14 DIAGNOSIS — Z85048 Personal history of other malignant neoplasm of rectum, rectosigmoid junction, and anus: Secondary | ICD-10-CM | POA: Diagnosis not present

## 2014-06-14 DIAGNOSIS — Z87448 Personal history of other diseases of urinary system: Secondary | ICD-10-CM | POA: Diagnosis not present

## 2014-06-14 DIAGNOSIS — Z862 Personal history of diseases of the blood and blood-forming organs and certain disorders involving the immune mechanism: Secondary | ICD-10-CM | POA: Diagnosis not present

## 2014-06-14 DIAGNOSIS — N2889 Other specified disorders of kidney and ureter: Secondary | ICD-10-CM | POA: Diagnosis not present

## 2014-06-14 DIAGNOSIS — C801 Malignant (primary) neoplasm, unspecified: Secondary | ICD-10-CM | POA: Diagnosis not present

## 2014-06-14 NOTE — ED Notes (Signed)
Pt reports that her rectum and head have been hurting for "some time". Pt sts she was dx with colon cancer 2 years ago but stopped going to the doctor.

## 2014-06-15 ENCOUNTER — Other Ambulatory Visit: Payer: Self-pay | Admitting: Hematology & Oncology

## 2014-06-15 ENCOUNTER — Emergency Department (HOSPITAL_COMMUNITY): Payer: Medicare Other

## 2014-06-15 ENCOUNTER — Emergency Department (HOSPITAL_COMMUNITY)
Admission: EM | Admit: 2014-06-15 | Discharge: 2014-06-15 | Disposition: A | Discharge status: Home / Self Care | Payer: Medicare Other | Attending: Emergency Medicine | Admitting: Emergency Medicine

## 2014-06-15 ENCOUNTER — Encounter (HOSPITAL_COMMUNITY): Payer: Self-pay | Admitting: Radiology

## 2014-06-15 ENCOUNTER — Encounter: Payer: Self-pay | Admitting: Hematology & Oncology

## 2014-06-15 ENCOUNTER — Telehealth: Payer: Self-pay | Admitting: Hematology & Oncology

## 2014-06-15 DIAGNOSIS — K6289 Other specified diseases of anus and rectum: Secondary | ICD-10-CM

## 2014-06-15 DIAGNOSIS — C801 Malignant (primary) neoplasm, unspecified: Secondary | ICD-10-CM

## 2014-06-15 DIAGNOSIS — C49A0 Gastrointestinal stromal tumor, unspecified site: Secondary | ICD-10-CM

## 2014-06-15 DIAGNOSIS — D500 Iron deficiency anemia secondary to blood loss (chronic): Secondary | ICD-10-CM

## 2014-06-15 DIAGNOSIS — R51 Headache: Secondary | ICD-10-CM | POA: Diagnosis not present

## 2014-06-15 LAB — I-STAT CHEM 8, ED
BUN: 20 mg/dL (ref 6–23)
Calcium, Ion: 1.04 mmol/L — ABNORMAL LOW (ref 1.13–1.30)
Chloride: 108 mmol/L (ref 96–112)
Creatinine, Ser: 0.7 mg/dL (ref 0.50–1.10)
GLUCOSE: 140 mg/dL — AB (ref 70–99)
HEMATOCRIT: 46 % (ref 36.0–46.0)
Hemoglobin: 15.6 g/dL — ABNORMAL HIGH (ref 12.0–15.0)
Potassium: 6.4 mmol/L (ref 3.5–5.1)
Sodium: 140 mmol/L (ref 135–145)
TCO2: 24 mmol/L (ref 0–100)

## 2014-06-15 LAB — COMPREHENSIVE METABOLIC PANEL
ALT: 6 U/L (ref 0–35)
AST: 45 U/L — ABNORMAL HIGH (ref 0–37)
Albumin: 4.3 g/dL (ref 3.5–5.2)
Alkaline Phosphatase: 182 U/L — ABNORMAL HIGH (ref 39–117)
Anion gap: 9 (ref 5–15)
BUN: 14 mg/dL (ref 6–23)
CO2: 25 mmol/L (ref 19–32)
Calcium: 9 mg/dL (ref 8.4–10.5)
Chloride: 104 mmol/L (ref 96–112)
Creatinine, Ser: 0.79 mg/dL (ref 0.50–1.10)
GFR calc non Af Amer: 82 mL/min — ABNORMAL LOW (ref >90)
GLUCOSE: 141 mg/dL — AB (ref 70–99)
Potassium: 6.2 mmol/L (ref 3.5–5.1)
SODIUM: 138 mmol/L (ref 135–145)
TOTAL PROTEIN: 7.1 g/dL (ref 6.0–8.3)
Total Bilirubin: 1.6 mg/dL — ABNORMAL HIGH (ref 0.3–1.2)

## 2014-06-15 LAB — CBC WITH DIFFERENTIAL/PLATELET
Basophils Absolute: 0 10*3/uL (ref 0.0–0.1)
Basophils Relative: 0 % (ref 0–1)
EOS ABS: 0 10*3/uL (ref 0.0–0.7)
EOS PCT: 1 % (ref 0–5)
HEMATOCRIT: 41.4 % (ref 36.0–46.0)
Hemoglobin: 13.3 g/dL (ref 12.0–15.0)
LYMPHS ABS: 1 10*3/uL (ref 0.7–4.0)
Lymphocytes Relative: 13 % (ref 12–46)
MCH: 22.6 pg — AB (ref 26.0–34.0)
MCHC: 32.1 g/dL (ref 30.0–36.0)
MCV: 70.4 fL — AB (ref 78.0–100.0)
MONO ABS: 0.5 10*3/uL (ref 0.1–1.0)
MONOS PCT: 6 % (ref 3–12)
Neutro Abs: 6.2 10*3/uL (ref 1.7–7.7)
Neutrophils Relative %: 80 % — ABNORMAL HIGH (ref 43–77)
PLATELETS: 289 10*3/uL (ref 150–400)
RBC: 5.88 MIL/uL — ABNORMAL HIGH (ref 3.87–5.11)
RDW: 16.3 % — ABNORMAL HIGH (ref 11.5–15.5)
WBC: 7.7 10*3/uL (ref 4.0–10.5)

## 2014-06-15 LAB — POTASSIUM: Potassium: 3.4 mmol/L — ABNORMAL LOW (ref 3.5–5.1)

## 2014-06-15 MED ORDER — DOCUSATE SODIUM 100 MG PO CAPS: 100.0000 mg | ORAL_CAPSULE | Freq: Two times a day (BID) | ORAL | Status: DC

## 2014-06-15 MED ORDER — FENTANYL CITRATE 0.05 MG/ML IJ SOLN
50.0000 ug | Freq: Once | INTRAMUSCULAR | Status: AC
  Administered 2014-06-15: 50 ug via INTRAVENOUS
  Filled 2014-06-15: qty 2

## 2014-06-15 MED ORDER — IOHEXOL 300 MG/ML  SOLN
25.0000 mL | Freq: Once | INTRAMUSCULAR | Status: AC | PRN
  Administered 2014-06-15: 25 mL via ORAL

## 2014-06-15 MED ORDER — IOHEXOL 300 MG/ML  SOLN
100.0000 mL | Freq: Once | INTRAMUSCULAR | Status: AC | PRN
  Administered 2014-06-15: 100 mL via INTRAVENOUS

## 2014-06-15 MED ORDER — HYDROCODONE-ACETAMINOPHEN 5-325 MG PO TABS: 2.0000 | ORAL_TABLET | ORAL | Status: DC | PRN

## 2014-06-15 NOTE — ED Provider Notes (Signed)
CSN: [id number]     Arrival date & time 06/14/14  2252 History  This chart was scribed for Noemi Chapel, MD by Eustaquio Maize, ED Scribe. This patient was seen in room B14C/B14C and the patient's care was started at 1:13 AM.    Chief Complaint  Patient presents with  . Headache  . Rectal Pain   The history is provided by the patient. No language interpreter was used.     HPI Comments: Shannon Terry is a 74 y.o. female with hx rectal cancer diagnosed 2 years ago by colonoscopy who presents to the Emergency Department complaining of recurrence of rectal pain that began several weeks ago. She states that the pain is persistent and has gradually been worsening. Pt is having difficulty sitting due to the pain and is having associated blood when wiping after bowel movement. t has been using heating pad to rectum which she states mildly alleviates her symptoms. SPt stopped going to see oncologist 1 year ago. Pt admits to chemotherapy and radiation in the past but states that she couldn't handle it anymore so she stopped. She also complains of intermittent headache that began around the same time. Pt mentions mild abdominal pain as well.  Phe denies chest pain, syncope, or any other symptoms.    Past Medical History  Diagnosis Date  . Obesity     BMI 42 Jan 2014.   . Iron deficiency anemia due to chronic blood loss 04/05/12  . GERD (gastroesophageal reflux disease)   . Rectal cancer 04/03/12    gastrointestinal stromal tumor  . Anemia, iron deficiency     chronic blood loss  . GI bleeding     hx of  . Hemorrhoids   . Acute renal failure   . Gastritis without bleeding   . Anasarca   . Hematochezia     Prior to Diagniosis  . History of blood transfusion 04/01/12    Hgb. 2.9/ 6 Units of PRBC  . 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 Abdominal hysterectomy      Partial  . Esophagogastroduodenoscopy  04/03/2012    Procedure: ESOPHAGOGASTRODUODENOSCOPY  (EGD);  Surgeon: Jerene Bears, MD;  Location: [place];  Service: Gastroenterology;  Laterality: N/A;  . Colonoscopy  04/03/2012    Procedure: COLONOSCOPY;  Surgeon: Jerene Bears, MD;  Location: [place];  Service: Gastroenterology;  Laterality: N/A;  . Eus  04/08/2012    Procedure: LOWER ENDOSCOPIC ULTRASOUND (EUS);  Surgeon: Milus Banister, MD;  Location: [facility];  Service: Endoscopy;  Laterality: N/A;  . Biopsy stomach  04/03/12    neg for malignancy  . Rectal biopsy  04/03/12    gastrointestinal stromal tumor   Family History  Problem Relation Age of Onset  . Diabetes Mellitus II Father     died from stroke  . Leukemia Brother   . Pancreatic cancer Sister   . Heart attack Brother    History  Substance Use Topics  . Smoking status: Never Smoker   . Smokeless tobacco: Never Used     Comment: never used tobacco  . Alcohol Use: No   OB History    Obstetric Comments   No HRT     Review of Systems  All other systems reviewed and are negative.     Allergies  Review of patient's allergies indicates no known allergies.  Home Medications   Prior to Admission medications   Medication Sig Start Date End Date Taking?  Authorizing Provider  docusate sodium (COLACE) 100 MG capsule Take 1 capsule (100 mg total) by mouth every 12 (twelve) hours. 06/15/14   Noemi Chapel, MD  HYDROcodone-acetaminophen (NORCO/VICODIN) 5-325 MG per tablet Take 2 tablets by mouth every 4 (four) hours as needed. 06/15/14   Noemi Chapel, MD  imatinib (GLEEVEC) 400 MG tablet Take 2 tablets (800 mg total) by mouth daily. Take with meals and large glass of water.Caution:Chemotherapy. Patient not taking: Reported on 06/15/2014 04/30/13   Volanda Napoleon, MD  megestrol (MEGACE ORAL) 40 MG/ML suspension Take 20 mLs (800 mg total) by mouth daily. Patient not taking: Reported on 06/15/2014 10/24/12   Volanda Napoleon, MD  oxyCODONE-acetaminophen (PERCOCET/ROXICET) 5-325 MG per tablet Take 1 tablet by mouth  every 4 (four) hours as needed for pain. Patient not taking: Reported on 06/15/2014 10/24/12   Volanda Napoleon, MD  triamterene-hydrochlorothiazide (MAXZIDE-25) 37.5-25 MG per tablet Take 1 tablet by mouth daily. Patient not taking: Reported on 06/15/2014 04/30/13   Volanda Napoleon, MD   Triage Vitals: BP 206/101 mmHg  Pulse 91  Temp(Src) 98.5 F (36.9 C) (Oral)  Resp 21  Ht 5\' 4"  (1.626 m)  Wt 184 lb 4.8 oz (83.598 kg)  BMI 31.62 kg/m2  SpO2 100%   Physical Exam  Constitutional: She appears well-developed and well-nourished. No distress.  HENT:  Head: Normocephalic and atraumatic.  Mouth/Throat: Oropharynx is clear and moist. No oropharyngeal exudate.  Eyes: Conjunctivae and EOM are normal. Pupils are equal, round, and reactive to light. Right eye exhibits no discharge. Left eye exhibits no discharge. No scleral icterus.  Neck: Normal range of motion. Neck supple. No JVD present. No thyromegaly present.  Cardiovascular: Normal rate, regular rhythm, normal heart sounds and intact distal pulses.  Exam reveals no gallop and no friction rub.   No murmur heard. Pulmonary/Chest: Effort normal and breath sounds normal. No respiratory distress. She has no wheezes. She has no rales.  Abdominal: Soft. Bowel sounds are normal. She exhibits no distension and no mass. There is no tenderness.  Genitourinary:  Rectal mass at 6 o'clock anteriorly.  Musculoskeletal: Normal range of motion. She exhibits no edema or tenderness.  Lymphadenopathy:    She has no cervical adenopathy.  Neurological: She is alert. Coordination normal.  Skin: Skin is warm and dry. No rash noted. No erythema.  Psychiatric: She has a normal mood and affect. Her behavior is normal.  Nursing note and vitals reviewed.   ED Course  Procedures (including critical care time)  DIAGNOSTIC STUDIES: Oxygen Saturation is 100% on RA, normal by my interpretation.    COORDINATION OF CARE: 1:22 AM-Discussed treatment plan which  includes CT A/P, CT Head, and pain medicationwith pt at bedside and pt agreed to plan.   Labs Review Labs Reviewed  CBC WITH DIFFERENTIAL/PLATELET - Abnormal; Notable for the following:    RBC 5.88 (*)    MCV 70.4 (*)    MCH 22.6 (*)    RDW 16.3 (*)    Neutrophils Relative % 80 (*)    All other components within normal limits  COMPREHENSIVE METABOLIC PANEL - Abnormal; Notable for the following:    Potassium 6.2 (*)    Glucose, Bld 141 (*)    AST 45 (*)    Alkaline Phosphatase 182 (*)    Total Bilirubin 1.6 (*)    GFR calc non Af Amer 82 (*)    All other components within normal limits  POTASSIUM - Abnormal; Notable for the following:  Potassium 3.4 (*)    All other components within normal limits  I-STAT CHEM 8, ED - Abnormal; Notable for the following:    Potassium 6.4 (*)    Glucose, Bld 140 (*)    Calcium, Ion 1.04 (*)    Hemoglobin 15.6 (*)    All other components within normal limits    Imaging Review Ct Head Wo Contrast  06/15/2014   CLINICAL DATA:  Headache and history of colon cancer.  EXAM: CT HEAD WITHOUT CONTRAST  TECHNIQUE: Contiguous axial images were obtained from the base of the skull through the vertex without intravenous contrast.  COMPARISON:  None.  FINDINGS: Skull and Sinuses:Negative for fracture or destructive process. The mastoids, middle ears, and imaged paranasal sinuses are clear.  Orbits: No acute abnormality.  Brain: No evidence of acute infarction, hemorrhage, hydrocephalus, or mass lesion/mass effect. Tiny, incidental CSF accumulation near the right cerebellum.  IMPRESSION: No explanation for headache.   Electronically Signed   By: Monte Fantasia M.D.   On: 06/15/2014 03:27   Ct Abdomen Pelvis W Contrast  06/15/2014   CLINICAL DATA:  Evaluate for progression of rectal gist.  EXAM: CT ABDOMEN AND PELVIS WITH CONTRAST  TECHNIQUE: Multidetector CT imaging of the abdomen and pelvis was performed using the standard protocol following bolus administration  of intravenous contrast.  CONTRAST:  Not currently available, reference EMR.  COMPARISON:  04/30/2013  FINDINGS: BODY WALL: No contributory findings.  LOWER CHEST: Chronic cardiomegaly. There is a small sliding hiatal hernia with reflux or poor esophageal clearance.  ABDOMEN/PELVIS:  Liver: No focal abnormality.  Biliary: No evidence of biliary obstruction or stone.  Pancreas: Unremarkable.  Spleen: Unremarkable.  Adrenals: Unremarkable.  Kidneys and ureters: No hydronephrosis or stone.  Bladder: Unremarkable.  Reproductive: Hysterectomy.  Bowel: Long-standing rectal mass shows interval enlargement, now 75 x 78 mm (previously 56 x 64 mm). When accounting for the distorted rectum, there is no definitive necrosis or superimposed fluid collection. There is no surrounding inflammation to suggest bowel fistula or abscess. No bowel obstruction. There is new enlargement of rounded lymph nodes in the mesorectum, up to 11 mm. Prominent rounded nodes seen in the IMA distribution, although strictly not enlarged at the level of the aorta. Colonic diverticulosis.  Peritoneum: No evidence of peritoneal spread.  Vascular: No acute abnormality.  OSSEOUS: Stable 5.5 cm long, 16 mm thick fatty mass anterior to the right obturator foramen, consistent with simple lipoma. L5 pars defects without accelerated degenerative disc disease.  IMPRESSION: Progressed rectal mass, reportedly a GIST. The mass now measures up to 8 cm, but does not cause colonic obstruction. Although nodal metastases would be unusual, there is suspicious enlargement and rounding of mesorectal and lower IMA lymph nodes.   Electronically Signed   By: Monte Fantasia M.D.   On: 06/15/2014 04:01     MDM   Final diagnoses:  Cancer  Rectal mass    The patient is otherwise well-appearing, she is not tachycardic hypoxic or hypotensive, she is not febrile, her labs are unremarkable, potassium rechecked and was normal at 3.4, no leukocytosis, preserved renal  function, CT scan reviewed showing a enlarging rectal mass measuring up to 8 cm, there is no signs of obstruction, the patient was informed of all of these results and I have suggested and strongly encouraged that she follow back up with her oncologist for further care in the outpatient setting, she appears to be amenable to this suggestion. We'll send home with some pain medication and  stool softener until she can follow-up with her family doctor in several days. The patient is in agreement with the plan.   I personally performed the services described in this documentation, which was scribed in my presence. The recorded information has been reviewed and is accurate.       Noemi Chapel, MD 06/15/14 [phone]

## 2014-06-15 NOTE — Discharge Instructions (Signed)
Please call your doctor for a followup appointment within 24-48 hours. When you talk to your doctor please let them know that you were seen in the emergency department and have them acquire all of your records so that they can discuss the findings with you and formulate a treatment plan to fully care for your new and ongoing problems. ° °

## 2014-06-15 NOTE — Telephone Encounter (Signed)
Pt aware of 4-15 appointment

## 2014-06-19 ENCOUNTER — Encounter: Payer: Self-pay | Admitting: Hematology & Oncology

## 2014-06-19 ENCOUNTER — Ambulatory Visit (HOSPITAL_BASED_OUTPATIENT_CLINIC_OR_DEPARTMENT_OTHER): Payer: Medicare Other | Admitting: Hematology & Oncology

## 2014-06-19 ENCOUNTER — Other Ambulatory Visit (HOSPITAL_BASED_OUTPATIENT_CLINIC_OR_DEPARTMENT_OTHER): Payer: Medicare Other

## 2014-06-19 VITALS — BP 199/105 | HR 128 | Temp 98.2°F | Resp 18 | Ht 64.0 in | Wt 181.0 lb

## 2014-06-19 DIAGNOSIS — C499 Malignant neoplasm of connective and soft tissue, unspecified: Secondary | ICD-10-CM

## 2014-06-19 DIAGNOSIS — C49A0 Gastrointestinal stromal tumor, unspecified site: Secondary | ICD-10-CM

## 2014-06-19 DIAGNOSIS — D500 Iron deficiency anemia secondary to blood loss (chronic): Secondary | ICD-10-CM

## 2014-06-19 DIAGNOSIS — R64 Cachexia: Secondary | ICD-10-CM

## 2014-06-19 LAB — IRON AND TIBC
%SAT: 27 % (ref 20–55)
Iron: 63 ug/dL (ref 42–145)
TIBC: 235 ug/dL — ABNORMAL LOW (ref 250–470)
UIBC: 172 ug/dL (ref 125–400)

## 2014-06-19 LAB — CBC WITH DIFFERENTIAL (CANCER CENTER ONLY)
BASO#: 0 10*3/uL (ref 0.0–0.2)
BASO%: 0.8 % (ref 0.0–2.0)
EOS%: 6.3 % (ref 0.0–7.0)
Eosinophils Absolute: 0.2 10*3/uL (ref 0.0–0.5)
HCT: 39.2 % (ref 34.8–46.6)
HGB: 12.5 g/dL (ref 11.6–15.9)
LYMPH#: 1.4 10*3/uL (ref 0.9–3.3)
LYMPH%: 37.8 % (ref 14.0–48.0)
MCH: 22.6 pg — ABNORMAL LOW (ref 26.0–34.0)
MCHC: 31.9 g/dL — AB (ref 32.0–36.0)
MCV: 71 fL — ABNORMAL LOW (ref 81–101)
MONO#: 0.4 10*3/uL (ref 0.1–0.9)
MONO%: 12 % (ref 0.0–13.0)
NEUT#: 1.6 10*3/uL (ref 1.5–6.5)
NEUT%: 43.1 % (ref 39.6–80.0)
PLATELETS: 267 10*3/uL (ref 145–400)
RBC: 5.54 10*6/uL — ABNORMAL HIGH (ref 3.70–5.32)
RDW: 16.4 % — AB (ref 11.1–15.7)
WBC: 3.7 10*3/uL — ABNORMAL LOW (ref 3.9–10.0)

## 2014-06-19 LAB — COMPREHENSIVE METABOLIC PANEL
ALBUMIN: 4.3 g/dL (ref 3.5–5.2)
ALT: 9 U/L (ref 0–35)
AST: 16 U/L (ref 0–37)
Alkaline Phosphatase: 181 U/L — ABNORMAL HIGH (ref 39–117)
BUN: 13 mg/dL (ref 6–23)
CALCIUM: 9.5 mg/dL (ref 8.4–10.5)
CO2: 26 meq/L (ref 19–32)
Chloride: 105 mEq/L (ref 96–112)
Creatinine, Ser: 0.72 mg/dL (ref 0.50–1.10)
GLUCOSE: 83 mg/dL (ref 70–99)
Potassium: 4.1 mEq/L (ref 3.5–5.3)
Sodium: 140 mEq/L (ref 135–145)
Total Bilirubin: 0.4 mg/dL (ref 0.2–1.2)
Total Protein: 6.9 g/dL (ref 6.0–8.3)

## 2014-06-19 LAB — FERRITIN: Ferritin: 452 ng/mL — ABNORMAL HIGH (ref 10–291)

## 2014-06-19 MED ORDER — SUNITINIB MALATE 12.5 MG PO CAPS: ORAL_CAPSULE | ORAL | Status: DC

## 2014-06-19 MED ORDER — OXYCODONE-ACETAMINOPHEN 5-325 MG PO TABS: 1.0000 | ORAL_TABLET | ORAL | Status: DC | PRN

## 2014-06-19 NOTE — Progress Notes (Signed)
Hematology and Oncology Follow Up Visit  Shannon Terry [id number] 09-21-1940 74 y.o. 06/19/2014   Principle Diagnosis:   Rectal GIST  Iron deficiency anemia  Current Therapy:    Patient stopped taking [place] 1 year ago     Interim History:  Ms.  Terry is back for a long-awaited follow-up. We last saw her a year ago. She never lite having to come to see [place] area and she stopped taking her Gleevec a year ago because she said it was making her sick. We had increased the dose up to 800 mg a day. Her tumor had stabilized and was not shrinking any further. At the 800 mg a day dose, she is having some problems. We want to cut her back in 400 mg a day which she just stopped taking.  She now went to the emergency room back on April 10. Is having increasing rectal pain. She's having no bleeding. She is having no constipation or diarrhea. There is no weight loss. She is having no abdominal pain. She had no cough. There was no leg swelling.  She had a CT scan done. This, unfortunately, showed growth of the rectal mass. This now measured 7.5 x 7.8 cm. It previously measured 5.6 x 6.4 cm. There is some enlarged misorectal lymph nodes.  She also stopped taking her blood pressure medication. Her blood pressure today was incredibly high area  She just has been incredibly resilient and trying to take medication. She doesn't feel she needs to take medicine and just wants to be kept comfortable.  She has had no headache. She's had no nausea or vomiting.  Overall, her performance status is ECOG 1..  Medications:  Current outpatient prescriptions:  .  docusate sodium (COLACE) 100 MG capsule, Take 1 capsule (100 mg total) by mouth every 12 (twelve) hours., Disp: 30 capsule, Rfl: 0 .  megestrol (MEGACE ORAL) 40 MG/ML suspension, Take 20 mLs (800 mg total) by mouth daily. (Patient not taking: Reported on 06/15/2014), Disp: 480 mL, Rfl: 4 .  oxyCODONE-acetaminophen (PERCOCET/ROXICET) 5-325 MG per tablet, Take 1  tablet by mouth every 4 (four) hours as needed., Disp: 90 tablet, Rfl: 0 .  SUNItinib (SUTENT) 12.5 MG capsule, Take 4 pills a day for 28 days then 14 days off, Disp: 112 capsule, Rfl: 3 .  triamterene-hydrochlorothiazide (MAXZIDE-25) 37.5-25 MG per tablet, Take 1 tablet by mouth daily. (Patient not taking: Reported on 06/15/2014), Disp: 30 tablet, Rfl: 1  Allergies: No Known Allergies  Past Medical History, Surgical history, Social history, and Family History were reviewed and updated.  Review of Systems: As above  Physical Exam:  height is 5\' 4"  (1.626 m) and weight is 181 lb (82.101 kg). Her oral temperature is 98.2 F (36.8 C). Her blood pressure is 199/105 and her pulse is 128. Her respiration is 18.   Fairly well-developed and well-nourished African [place] female. Head and neck exam shows no ocular or oral lesions. There is no adenopathy in the neck. Lungs are clear. Cardiac exam regular rate and rhythm with no murmurs, rubs or bruits.. Abdomen is soft. Has good bowel sounds. There is no fluid wave. There is no palpable hepatosplenomegaly. Back exam shows no tenderness over the spine ribs or hips. Inguinal exam shows no adenopathy. Extremities shows some trace edema in her lower legs. She has good strength in her muscles. Has good range of motion of her joints. Neurological exam shows no focal neurological deficits. Skin exam no rashes ecchymosis or petechia.  Lab Results  Component Value Date   WBC 3.7* 06/19/2014   HGB 12.5 06/19/2014   HCT 39.2 06/19/2014   MCV 71* 06/19/2014   PLT 267 06/19/2014     Chemistry      Component Value Date/Time   NA 140 06/15/2014 0154   NA 143 06/20/2013 1052   K 3.4* 06/15/2014 0400   K 3.6 06/20/2013 1052   CL 108 06/15/2014 0154   CL 106 06/20/2013 1052   CO2 25 06/15/2014 0140   CO2 31 06/20/2013 1052   BUN 20 06/15/2014 0154   BUN 15 06/20/2013 1052   CREATININE 0.70 06/15/2014 0154   CREATININE 0.8 06/20/2013 1052      Component  Value Date/Time   CALCIUM 9.0 06/15/2014 0140   CALCIUM 9.5 06/20/2013 1052   ALKPHOS 182* 06/15/2014 0140   ALKPHOS 104* 06/20/2013 1052   AST 45* 06/15/2014 0140   AST 31 06/20/2013 1052   ALT 6 06/15/2014 0140   ALT 22 06/20/2013 1052   BILITOT 1.6* 06/15/2014 0140   BILITOT 0.60 06/20/2013 1052         Impression and Plan: Shannon Terry is a 74 year old African American female with a rectal stromal tumor. She was diagnosed back in January of 2014. She's had a nice response to Gleevec at 400 mg. Her last scan seemed to show a slight increase in size. I increased her dose to 800 mg.   It is clear that her tumor is growing again.  I talked her about getting her back on treatment. I think Sutent would be reasonable. I think a dose of 50 mg a day for 4 weeks on and 2 weeks off certainly would be reasonable.  She is very worried about the cost. I will see about the cost and whether or not we can get some assistance for her.  If, for some reason, we cannot get her on Sutent, I think that we will have to go with radiation. I think radiation therapy could cause some significant shrinkage so she will not end up with problems.  Again, she is very resistant to doing anything. Her daughter and husband are trying to talk her into trach getting something treated. I agree with this. I think it is tumor continues to grow, it will cause her problems with obstruction, pain, bleeding, perforation. I think if these begin to happen, she will be very miserable and will have a limited quality of life and quantity of life.  I spent about 45 minutes with she and her family. I tried talking with her at length about the problem and what will happen if this is not treated. Her family has a very good understanding of the issue but she just is resistant to listening.  I went over the side effects of Sutent with her. I explained all this to her. I told her that we will have to monitor her closely as we start the  Sutent to make sure that she does not have problems with her electrolytes, or lungs, or liver.   I want to try to get her back in 2 or 3 weeks. Hopefully we will have her on treatment by then.  Volanda Napoleon, MD 4/15/20164:33 PM

## 2014-06-22 ENCOUNTER — Telehealth: Payer: Self-pay | Admitting: Hematology & Oncology

## 2014-06-22 NOTE — Telephone Encounter (Signed)
I called 10 times someone would answer but hang up. Daughter's number no answer or voice mail. I called using my personal cell phone got voice mail and as I was leaving message the pt pick up the phone and said she was not coming back anymore she's done give up. RN and MD aware.

## 2014-07-13 ENCOUNTER — Ambulatory Visit: Payer: Medicare Other | Admitting: Hematology & Oncology

## 2014-07-13 ENCOUNTER — Other Ambulatory Visit: Payer: Medicare Other

## 2014-09-20 ENCOUNTER — Emergency Department (HOSPITAL_COMMUNITY)
Admission: EM | Admit: 2014-09-20 | Discharge: 2014-09-20 | Disposition: A | Discharge status: Home / Self Care | Payer: Medicare Other | Attending: Emergency Medicine | Admitting: Emergency Medicine

## 2014-09-20 ENCOUNTER — Encounter (HOSPITAL_COMMUNITY): Payer: Self-pay | Admitting: Emergency Medicine

## 2014-09-20 ENCOUNTER — Emergency Department (HOSPITAL_COMMUNITY): Payer: Medicare Other

## 2014-09-20 DIAGNOSIS — Z8719 Personal history of other diseases of the digestive system: Secondary | ICD-10-CM | POA: Insufficient documentation

## 2014-09-20 DIAGNOSIS — C49A0 Gastrointestinal stromal tumor, unspecified site: Secondary | ICD-10-CM

## 2014-09-20 DIAGNOSIS — D490 Neoplasm of unspecified behavior of digestive system: Secondary | ICD-10-CM | POA: Diagnosis not present

## 2014-09-20 DIAGNOSIS — Z862 Personal history of diseases of the blood and blood-forming organs and certain disorders involving the immune mechanism: Secondary | ICD-10-CM | POA: Insufficient documentation

## 2014-09-20 DIAGNOSIS — Z87448 Personal history of other diseases of urinary system: Secondary | ICD-10-CM | POA: Diagnosis not present

## 2014-09-20 DIAGNOSIS — Z8509 Personal history of malignant neoplasm of other digestive organs: Secondary | ICD-10-CM | POA: Diagnosis not present

## 2014-09-20 DIAGNOSIS — R64 Cachexia: Secondary | ICD-10-CM

## 2014-09-20 DIAGNOSIS — E669 Obesity, unspecified: Secondary | ICD-10-CM | POA: Insufficient documentation

## 2014-09-20 DIAGNOSIS — K6289 Other specified diseases of anus and rectum: Secondary | ICD-10-CM | POA: Diagnosis present

## 2014-09-20 LAB — CBC WITH DIFFERENTIAL/PLATELET
Basophils Absolute: 0 10*3/uL (ref 0.0–0.1)
Basophils Relative: 0 % (ref 0–1)
Eosinophils Absolute: 0 10*3/uL (ref 0.0–0.7)
Eosinophils Relative: 0 % (ref 0–5)
HCT: 35.4 % — ABNORMAL LOW (ref 36.0–46.0)
Hemoglobin: 11.2 g/dL — ABNORMAL LOW (ref 12.0–15.0)
Lymphocytes Relative: 8 % — ABNORMAL LOW (ref 12–46)
Lymphs Abs: 0.7 10*3/uL (ref 0.7–4.0)
MCH: 22.4 pg — ABNORMAL LOW (ref 26.0–34.0)
MCHC: 31.6 g/dL (ref 30.0–36.0)
MCV: 70.9 fL — ABNORMAL LOW (ref 78.0–100.0)
Monocytes Absolute: 0.3 10*3/uL (ref 0.1–1.0)
Monocytes Relative: 3 % (ref 3–12)
Neutro Abs: 7.3 10*3/uL (ref 1.7–7.7)
Neutrophils Relative %: 89 % — ABNORMAL HIGH (ref 43–77)
Platelets: 329 10*3/uL (ref 150–400)
RBC: 4.99 MIL/uL (ref 3.87–5.11)
RDW: 14.1 % (ref 11.5–15.5)
WBC: 8.3 10*3/uL (ref 4.0–10.5)

## 2014-09-20 LAB — COMPREHENSIVE METABOLIC PANEL
ALT: 8 U/L — ABNORMAL LOW (ref 14–54)
AST: 20 U/L (ref 15–41)
Albumin: 4.4 g/dL (ref 3.5–5.0)
Alkaline Phosphatase: 126 U/L (ref 38–126)
Anion gap: 10 (ref 5–15)
BUN: 14 mg/dL (ref 6–20)
CO2: 23 mmol/L (ref 22–32)
Calcium: 9.5 mg/dL (ref 8.9–10.3)
Chloride: 110 mmol/L (ref 101–111)
Creatinine, Ser: 0.79 mg/dL (ref 0.44–1.00)
GFR calc Af Amer: >60 mL/min (ref >60)
GFR calc non Af Amer: >60 mL/min (ref >60)
Glucose, Bld: 131 mg/dL — ABNORMAL HIGH (ref 65–99)
Potassium: 3.6 mmol/L (ref 3.5–5.1)
Sodium: 143 mmol/L (ref 135–145)
Total Bilirubin: 0.4 mg/dL (ref 0.3–1.2)
Total Protein: 7.1 g/dL (ref 6.5–8.1)

## 2014-09-20 LAB — POC OCCULT BLOOD, ED: Fecal Occult Bld: POSITIVE — AB

## 2014-09-20 MED ORDER — OXYCODONE-ACETAMINOPHEN 5-325 MG PO TABS: 1.0000 | ORAL_TABLET | Freq: Four times a day (QID) | ORAL | Status: DC | PRN

## 2014-09-20 MED ORDER — SODIUM CHLORIDE 0.9 % IV BOLUS (SEPSIS)
1000.0000 mL | Freq: Once | INTRAVENOUS | Status: AC
Start: 2014-09-20 — End: 2014-09-20
  Administered 2014-09-20: 1000 mL via INTRAVENOUS

## 2014-09-20 MED ORDER — MORPHINE SULFATE 2 MG/ML IJ SOLN
2.0000 mg | Freq: Once | INTRAMUSCULAR | Status: AC
  Administered 2014-09-20: 2 mg via INTRAVENOUS
  Filled 2014-09-20: qty 1

## 2014-09-20 MED ORDER — IOHEXOL 300 MG/ML  SOLN
25.0000 mL | Freq: Once | INTRAMUSCULAR | Status: AC | PRN
  Administered 2014-09-20: 25 mL via ORAL

## 2014-09-20 MED ORDER — ONDANSETRON HCL 4 MG/2ML IJ SOLN
4.0000 mg | Freq: Once | INTRAMUSCULAR | Status: AC
  Administered 2014-09-20: 4 mg via INTRAVENOUS
  Filled 2014-09-20: qty 2

## 2014-09-20 MED ORDER — IOHEXOL 300 MG/ML  SOLN
100.0000 mL | Freq: Once | INTRAMUSCULAR | Status: AC | PRN
  Administered 2014-09-20: 100 mL via INTRAVENOUS

## 2014-09-20 MED ORDER — OXYCODONE-ACETAMINOPHEN 5-325 MG PO TABS: 1.0000 | ORAL_TABLET | ORAL | Status: DC | PRN

## 2014-09-20 NOTE — ED Notes (Signed)
Patient returned from CT

## 2014-09-20 NOTE — ED Provider Notes (Signed)
CSN: [id number]     Arrival date & time 09/20/14  [id number] History   First MD Initiated Contact with Patient 09/20/14 [phone]     Chief Complaint  Patient presents with  . Rectal Pain   HPI   20 YOF with a history of rectal stromal tumor presents today with rectal pain. Pt was diagnosed ion Jan 2014, was taking Gleevec but discontinued it over a year ago due to side effects. Pt was last seen by oncology on April 15th 2016 with intentions of starting a new medication.  Pt reports that she was given pain medication but refused treatment for the tumor. Over the last several months patient has had intermittent episodes of straining, bloody bowel movements. She reports these episodes last a few hours and resolve with oral pain medication. She reports last night she began to experience one of these episodes, more severe than normal, small amount of one per rectum, pain not relieved by oral medication. Patient reports her last normal bowel movement was 1 day prior, the patient is uncertain of exact timing. Patient reports the only thing she takes his stool softener as needed, an oral pain medication. Patient laughs when I ask if she is taking her hypertensive medication, does not want to take any medications other than pain meds. Patient denies fevers, chills, chest pain or shortness of breath, abdominal pain or distention, weight loss, lower extremity swelling or edema. Patient does endorse nausea that started last night, with several episodes of vomiting.    Past Medical History  Diagnosis Date  . Obesity     BMI 42 Jan 2014.   . Iron deficiency anemia due to chronic blood loss 04/05/12  . GERD (gastroesophageal reflux disease)   . Rectal cancer 04/03/12    gastrointestinal stromal tumor  . Anemia, iron deficiency     chronic blood loss  . GI bleeding     hx of  . Hemorrhoids   . Acute renal failure   . Gastritis without bleeding   . Anasarca   . Hematochezia     Prior to Diagniosis  . History of  blood transfusion 04/01/12    Hgb. 2.9/ 6 Units of PRBC  . 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 Abdominal hysterectomy      Partial  . Esophagogastroduodenoscopy  04/03/2012    Procedure: ESOPHAGOGASTRODUODENOSCOPY (EGD);  Surgeon: Jerene Bears, MD;  Location: [facility];  Service: Gastroenterology;  Laterality: N/A;  . Colonoscopy  04/03/2012    Procedure: COLONOSCOPY;  Surgeon: Jerene Bears, MD;  Location: [facility];  Service: Gastroenterology;  Laterality: N/A;  . Eus  04/08/2012    Procedure: LOWER ENDOSCOPIC ULTRASOUND (EUS);  Surgeon: Milus Banister, MD;  Location: [facility];  Service: Endoscopy;  Laterality: N/A;  . Biopsy stomach  04/03/12    neg for malignancy  . Rectal biopsy  04/03/12    gastrointestinal stromal tumor   Family History  Problem Relation Age of Onset  . Diabetes Mellitus II Father     died from stroke  . Leukemia Brother   . Pancreatic cancer Sister   . Heart attack Brother    History  Substance Use Topics  . Smoking status: Never Smoker   . Smokeless tobacco: Never Used     Comment: never used tobacco  . Alcohol Use: No   OB History    Obstetric Comments   No HRT     Review of Systems  All other systems reviewed and are negative.   Allergies  Review of patient's allergies indicates no known allergies.  Home Medications   Prior to Admission medications   Medication Sig Start Date End Date Taking? Authorizing Provider  acetaminophen (TYLENOL) 325 MG tablet Take 650 mg by mouth every 6 (six) hours as needed for mild pain.   Yes Historical Provider, MD  docusate sodium (COLACE) 100 MG capsule Take 1 capsule (100 mg total) by mouth every 12 (twelve) hours. Patient not taking: Reported on 09/20/2014 06/15/14   Noemi Chapel, MD  megestrol (MEGACE ORAL) 40 MG/ML suspension Take 20 mLs (800 mg total) by mouth daily. Patient not taking: Reported on 06/15/2014 10/24/12   Volanda Napoleon, MD   oxyCODONE-acetaminophen (PERCOCET/ROXICET) 5-325 MG per tablet Take 1 tablet by mouth every 6 (six) hours as needed for severe pain. 09/20/14   Okey Regal, PA-C  SUNItinib (SUTENT) 12.5 MG capsule Take 4 pills a day for 28 days then 14 days off Patient not taking: Reported on 09/20/2014 06/19/14   Volanda Napoleon, MD  triamterene-hydrochlorothiazide (MAXZIDE-25) 37.5-25 MG per tablet Take 1 tablet by mouth daily. Patient not taking: Reported on 06/15/2014 04/30/13   Volanda Napoleon, MD   BP 181/92 mmHg  Pulse 83  Temp(Src) 98.2 F (36.8 C) (Oral)  Resp 18  Ht 5\' 3"  (1.6 m)  Wt 180 lb (81.647 kg)  BMI 31.89 kg/m2  SpO2 100%   Physical Exam  Constitutional: She is oriented to person, place, and time. She appears well-developed and well-nourished.  HENT:  Head: Normocephalic and atraumatic.  Eyes: Pupils are equal, round, and reactive to light.  Neck: Normal range of motion. Neck supple. No JVD present. No tracheal deviation present. No thyromegaly present.  Cardiovascular: Regular rhythm, normal heart sounds and intact distal pulses.  Exam reveals no gallop and no friction rub.   No murmur heard. Pulmonary/Chest: Effort normal and breath sounds normal. No stridor. No respiratory distress. She has no wheezes. She has no rales. She exhibits no tenderness.  Abdominal: Soft. Bowel sounds are normal. She exhibits no distension and no mass. There is no tenderness. There is no rebound and no guarding.  Genitourinary:  Large tumor 6:00 position in the rectum, no gross blood on exam  Musculoskeletal: Normal range of motion.  Lymphadenopathy:    She has no cervical adenopathy.  Neurological: She is alert and oriented to person, place, and time. Coordination normal.  Skin: Skin is warm and dry.  Psychiatric: She has a normal mood and affect. Her behavior is normal. Judgment and thought content normal.  Nursing note and vitals reviewed.   ED Course  Procedures (including critical care  time) Labs Review Labs Reviewed  CBC WITH DIFFERENTIAL/PLATELET - Abnormal; Notable for the following:    Hemoglobin 11.2 (*)    HCT 35.4 (*)    MCV 70.9 (*)    MCH 22.4 (*)    Neutrophils Relative % 89 (*)    Lymphocytes Relative 8 (*)    All other components within normal limits  COMPREHENSIVE METABOLIC PANEL - Abnormal; Notable for the following:    Glucose, Bld 131 (*)    ALT 8 (*)    All other components within normal limits  POC OCCULT BLOOD, ED - Abnormal; Notable for the following:    Fecal Occult Bld POSITIVE (*)    All other components within normal limits    Imaging Review Ct Abdomen Pelvis W Contrast  09/20/2014   CLINICAL DATA:  rectal pain, history of rectal cancer  EXAM: CT ABDOMEN AND PELVIS WITH CONTRAST  TECHNIQUE: Multidetector CT imaging of the abdomen and pelvis was performed using the standard protocol following bolus administration of intravenous contrast.  CONTRAST:  164mL OMNIPAQUE IOHEXOL 300 MG/ML  SOLN  COMPARISON:  06/15/2014  FINDINGS: Small hiatal hernia is noted. The lung bases are unremarkable. Sagittal images of the spine shows mild degenerative changes thoracolumbar spine.  No focal hepatic mass. Mild hepatic fatty infiltration. No calcified gallstones are noted within gallbladder. The pancreas, spleen and adrenal glands are unremarkable. Small lymph nodes are noted in right lower quadrant mesentery the largest in axial image 49 measures 1.1 cm. Nonspecific retroperitoneal lymph nodes are noted the largest left para-aortic axial image 45 measures 8 mm.  No aortic aneurysm.  Kidneys are symmetrical in size and enhancement. No hydronephrosis or hydroureter. Delayed renal images shows bilateral renal symmetrical excretion. Bilateral visualized proximal ureter is unremarkable.  There is no small bowel obstruction. No free abdominal air. Normal appendix. No pericecal inflammation.  Multiple colonic diverticula are noted descending colon. Multiple sigmoid colon  diverticula. No evidence of acute diverticulitis. Again noted a irregular multilobulated rectal mass. This measures 8.2 x 8.4 cm. This has increased in size from prior exam when measured 7.5 x 7.8 cm.  At least 3 perirectal lymph nodes are noted the largest in axial image 73 measures 9.4 mm. A right posterior perirectal lymph node axial image 71 measures 10.6 mm.  IMPRESSION: 1. There is progression in size of nodular multilobulated rectal mass measures at least 8.2 x 8.4 cm. On the prior exam measures 7.5 x 7.8 cm. At least 3 perirectal lymph nodes are noted the largest measures 10.6 mm. 2. No definite evidence of small bowel or colonic obstruction. 3. Small nonspecific lymph nodes are noted in right lower quadrant mesentery and retroperitoneum. 4. No hydronephrosis or hydroureter. 5. No definite evidence of liver masses.   Electronically Signed   By: Lahoma Crocker M.D.   On: 09/20/2014 12:02     EKG Interpretation None      MDM   Final diagnoses:  Rectal tumor    Labs: CBC, BMP, occult blood- positive  Imaging: CT abdomen and pelvis shows progression of the size of the rectal mass and no evidence of small bowel or colonic obstruction  Consults: Care management  Therapeutics:  Discharge Meds:   Assessment/Plan: Patient presents with rectal tumor increasing in size and pain. Oral pain medication was unsuccessful last night, patient continues to have bowel movements, continues to have bleeding with bowel movements. She is unlikely obstructed at this time, no significant findings on labs other than the occult blood. Patient reports that she does not want therapy, just wants pain medication. I informed her of potential consequences of not following up with oncology. Encouraged to follow up with oncology for further evaluation and management, potential pain management options. Care management was involved, patient was discharged home after her pain was managed here in the ED, she was given strict  return precautions, patient, her husband, her daughter all understood and agreed to today's plan and had no further questions or concerns at the time of discharge.              Okey Regal, PA-C 09/20/14 [place], PA-C 09/20/14 [place], MD 09/21/14 [phone]

## 2014-09-20 NOTE — ED Notes (Addendum)
States rectal pain; up all night straining. The straining helps the pain. History of rectal cancer. Reports she is able to have BM.

## 2014-09-20 NOTE — ED Notes (Signed)
Finished with PO contrast. Notified CT.

## 2014-09-20 NOTE — ED Notes (Signed)
Pt transported to CT ?

## 2014-09-20 NOTE — Care Management Note (Signed)
Case Management Note  Patient Details  Name: Shannon Terry MRN: [id number] Date of Birth: 01/20/1941  Subjective/Objective:       74 y.o.F seen in the ED for Rectal Pain. Referred to CM by Lenn Sink, PA-C as pt clearly having pain associated with Rectal Tumor for which she has stopped all treatment. Requesting referral to Palliative Care for Pain Management. Family at bedside supportive of return to [facility] for initiation of Palliative Care. Pt resistant. Would not give definitive reason as to why. Discussed role of the EDP and Palliative medicine in the management of pain as well as, other symptoms of terminal illness.  Encouraged and honored pt and families right to choice in treatment or lack thereof.           Action/Plan: Advised pt to call [facility] office and ask to make appt with CSW for Palliative services. No further services available at present.    Expected Discharge Date:                  Expected Discharge Plan:  Home/Self Care (pt is active with Oncologist Burney Gauze, MD)  In-House Referral:     Discharge planning Services  CM Consult  Post Acute Care Choice:    Choice offered to:     DME Arranged:    DME Agency:     HH Arranged:    [place] Agency:     Status of Service:  Completed, signed off  Medicare Important Message Given:    Date Medicare IM Given:    Medicare IM give by:    Date Additional Medicare IM Given:    Additional Medicare Important Message give by:     If discussed at [place] of Stay Meetings, dates discussed:    Additional Comments:  Delrae Sawyers, RN 09/20/2014, 1:25 PM

## 2014-09-20 NOTE — Discharge Instructions (Signed)
Please follow up with your oncologist for further evaluation and management. Please use medication as directed, continue using stool softener.Please discuss long term care options. Please return immediately if new or worsening signs of symptoms present.

## 2014-09-20 NOTE — ED Notes (Signed)
Pt undressed and in a gown; 2 warm blankets given; Sharyn Lull, RN aware

## 2015-04-07 ENCOUNTER — Encounter (HOSPITAL_COMMUNITY): Payer: Self-pay | Admitting: Emergency Medicine

## 2015-04-07 ENCOUNTER — Emergency Department (HOSPITAL_COMMUNITY): Payer: Medicare Other

## 2015-04-07 ENCOUNTER — Inpatient Hospital Stay (HOSPITAL_COMMUNITY)
Admission: EM | Admit: 2015-04-07 | Discharge: 2015-04-11 | DRG: 064 | Disposition: A | Discharge status: Hospice | Payer: Medicare Other | Attending: Family Medicine | Admitting: Family Medicine

## 2015-04-07 ENCOUNTER — Observation Stay (HOSPITAL_COMMUNITY): Payer: Medicare Other

## 2015-04-07 DIAGNOSIS — I10 Essential (primary) hypertension: Secondary | ICD-10-CM

## 2015-04-07 DIAGNOSIS — D500 Iron deficiency anemia secondary to blood loss (chronic): Secondary | ICD-10-CM | POA: Diagnosis not present

## 2015-04-07 DIAGNOSIS — R4182 Altered mental status, unspecified: Secondary | ICD-10-CM | POA: Insufficient documentation

## 2015-04-07 DIAGNOSIS — C20 Malignant neoplasm of rectum: Secondary | ICD-10-CM | POA: Insufficient documentation

## 2015-04-07 DIAGNOSIS — C49A0 Gastrointestinal stromal tumor, unspecified site: Secondary | ICD-10-CM | POA: Diagnosis not present

## 2015-04-07 DIAGNOSIS — I447 Left bundle-branch block, unspecified: Secondary | ICD-10-CM | POA: Diagnosis present

## 2015-04-07 DIAGNOSIS — W19XXXA Unspecified fall, initial encounter: Secondary | ICD-10-CM | POA: Insufficient documentation

## 2015-04-07 DIAGNOSIS — R29712 NIHSS score 12: Secondary | ICD-10-CM | POA: Diagnosis present

## 2015-04-07 DIAGNOSIS — D649 Anemia, unspecified: Secondary | ICD-10-CM | POA: Diagnosis not present

## 2015-04-07 DIAGNOSIS — R4701 Aphasia: Secondary | ICD-10-CM | POA: Diagnosis not present

## 2015-04-07 DIAGNOSIS — Z823 Family history of stroke: Secondary | ICD-10-CM

## 2015-04-07 DIAGNOSIS — C49A5 Gastrointestinal stromal tumor of rectum: Secondary | ICD-10-CM | POA: Diagnosis not present

## 2015-04-07 DIAGNOSIS — F449 Dissociative and conversion disorder, unspecified: Secondary | ICD-10-CM | POA: Insufficient documentation

## 2015-04-07 DIAGNOSIS — Z85048 Personal history of other malignant neoplasm of rectum, rectosigmoid junction, and anus: Secondary | ICD-10-CM

## 2015-04-07 DIAGNOSIS — Z8249 Family history of ischemic heart disease and other diseases of the circulatory system: Secondary | ICD-10-CM

## 2015-04-07 DIAGNOSIS — G9340 Encephalopathy, unspecified: Secondary | ICD-10-CM | POA: Diagnosis present

## 2015-04-07 DIAGNOSIS — C269 Malignant neoplasm of ill-defined sites within the digestive system: Secondary | ICD-10-CM | POA: Diagnosis not present

## 2015-04-07 DIAGNOSIS — I63421 Cerebral infarction due to embolism of right anterior cerebral artery: Principal | ICD-10-CM | POA: Diagnosis present

## 2015-04-07 DIAGNOSIS — Z7189 Other specified counseling: Secondary | ICD-10-CM | POA: Insufficient documentation

## 2015-04-07 DIAGNOSIS — S098XXA Other specified injuries of head, initial encounter: Secondary | ICD-10-CM | POA: Diagnosis not present

## 2015-04-07 DIAGNOSIS — R739 Hyperglycemia, unspecified: Secondary | ICD-10-CM | POA: Diagnosis present

## 2015-04-07 DIAGNOSIS — R000 Tachycardia, unspecified: Secondary | ICD-10-CM | POA: Diagnosis present

## 2015-04-07 DIAGNOSIS — Z800 Family history of malignant neoplasm of digestive organs: Secondary | ICD-10-CM

## 2015-04-07 DIAGNOSIS — T148 Other injury of unspecified body region: Secondary | ICD-10-CM | POA: Diagnosis not present

## 2015-04-07 DIAGNOSIS — R0989 Other specified symptoms and signs involving the circulatory and respiratory systems: Secondary | ICD-10-CM | POA: Diagnosis not present

## 2015-04-07 DIAGNOSIS — I639 Cerebral infarction, unspecified: Secondary | ICD-10-CM | POA: Diagnosis present

## 2015-04-07 DIAGNOSIS — E876 Hypokalemia: Secondary | ICD-10-CM | POA: Diagnosis present

## 2015-04-07 DIAGNOSIS — Z66 Do not resuscitate: Secondary | ICD-10-CM | POA: Diagnosis present

## 2015-04-07 DIAGNOSIS — F329 Major depressive disorder, single episode, unspecified: Secondary | ICD-10-CM | POA: Diagnosis present

## 2015-04-07 DIAGNOSIS — Z515 Encounter for palliative care: Secondary | ICD-10-CM | POA: Insufficient documentation

## 2015-04-07 DIAGNOSIS — K219 Gastro-esophageal reflux disease without esophagitis: Secondary | ICD-10-CM | POA: Diagnosis present

## 2015-04-07 DIAGNOSIS — Z806 Family history of leukemia: Secondary | ICD-10-CM

## 2015-04-07 LAB — I-STAT CHEM 8, ED
BUN: 14 mg/dL (ref 6–20)
CALCIUM ION: 1.26 mmol/L (ref 1.13–1.30)
CHLORIDE: 108 mmol/L (ref 101–111)
Creatinine, Ser: 0.6 mg/dL (ref 0.44–1.00)
GLUCOSE: 124 mg/dL — AB (ref 65–99)
HCT: 34 % — ABNORMAL LOW (ref 36.0–46.0)
Hemoglobin: 11.6 g/dL — ABNORMAL LOW (ref 12.0–15.0)
Potassium: 3.4 mmol/L — ABNORMAL LOW (ref 3.5–5.1)
SODIUM: 144 mmol/L (ref 135–145)
TCO2: 23 mmol/L (ref 0–100)

## 2015-04-07 LAB — COMPREHENSIVE METABOLIC PANEL
ALBUMIN: 3.8 g/dL (ref 3.5–5.0)
ALK PHOS: 94 U/L (ref 38–126)
ALT: 8 U/L — AB (ref 14–54)
ANION GAP: 8 (ref 5–15)
AST: 18 U/L (ref 15–41)
BILIRUBIN TOTAL: 0.1 mg/dL — AB (ref 0.3–1.2)
BUN: 12 mg/dL (ref 6–20)
CALCIUM: 9.6 mg/dL (ref 8.9–10.3)
CO2: 25 mmol/L (ref 22–32)
CREATININE: 0.65 mg/dL (ref 0.44–1.00)
Chloride: 110 mmol/L (ref 101–111)
GFR calc Af Amer: >60 mL/min (ref >60)
GFR calc non Af Amer: >60 mL/min (ref >60)
GLUCOSE: 130 mg/dL — AB (ref 65–99)
Potassium: 3.5 mmol/L (ref 3.5–5.1)
Sodium: 143 mmol/L (ref 135–145)
Total Protein: 6.6 g/dL (ref 6.5–8.1)

## 2015-04-07 LAB — DIFFERENTIAL
Basophils Absolute: 0 10*3/uL (ref 0.0–0.1)
Basophils Relative: 0 %
EOS PCT: 0 %
Eosinophils Absolute: 0 10*3/uL (ref 0.0–0.7)
LYMPHS ABS: 0.6 10*3/uL — AB (ref 0.7–4.0)
Lymphocytes Relative: 8 %
MONOS PCT: 7 %
Monocytes Absolute: 0.5 10*3/uL (ref 0.1–1.0)
Neutro Abs: 6.6 10*3/uL (ref 1.7–7.7)
Neutrophils Relative %: 85 %

## 2015-04-07 LAB — CBC
HEMATOCRIT: 30.1 % — AB (ref 36.0–46.0)
Hemoglobin: 9.4 g/dL — ABNORMAL LOW (ref 12.0–15.0)
MCH: 20.9 pg — AB (ref 26.0–34.0)
MCHC: 31.2 g/dL (ref 30.0–36.0)
MCV: 66.9 fL — AB (ref 78.0–100.0)
PLATELETS: 309 10*3/uL (ref 150–400)
RBC: 4.5 MIL/uL (ref 3.87–5.11)
RDW: 15.5 % (ref 11.5–15.5)
WBC: 7.7 10*3/uL (ref 4.0–10.5)

## 2015-04-07 LAB — URINALYSIS, ROUTINE W REFLEX MICROSCOPIC
BILIRUBIN URINE: NEGATIVE
Glucose, UA: NEGATIVE mg/dL
Hgb urine dipstick: NEGATIVE
KETONES UR: NEGATIVE mg/dL
Leukocytes, UA: NEGATIVE
NITRITE: NEGATIVE
Protein, ur: NEGATIVE mg/dL
Specific Gravity, Urine: 1.016 (ref 1.005–1.030)
pH: 5.5 (ref 5.0–8.0)

## 2015-04-07 LAB — RAPID URINE DRUG SCREEN, HOSP PERFORMED
Amphetamines: NOT DETECTED
BARBITURATES: NOT DETECTED
Benzodiazepines: NOT DETECTED
Cocaine: NOT DETECTED
OPIATES: NOT DETECTED
TETRAHYDROCANNABINOL: NOT DETECTED

## 2015-04-07 LAB — PROTIME-INR
INR: 1.1 (ref 0.00–1.49)
PROTHROMBIN TIME: 14.4 s (ref 11.6–15.2)

## 2015-04-07 LAB — I-STAT TROPONIN, ED: Troponin i, poc: 0 ng/mL (ref 0.00–0.08)

## 2015-04-07 LAB — ETHANOL: Alcohol, Ethyl (B): <5 mg/dL (ref <5)

## 2015-04-07 LAB — APTT: aPTT: 27 seconds (ref 24–37)

## 2015-04-07 MED ORDER — ENOXAPARIN SODIUM 40 MG/0.4ML ~~LOC~~ SOLN
40.0000 mg | SUBCUTANEOUS | Status: DC
  Administered 2015-04-07 – 2015-04-10 (×4): 40 mg via SUBCUTANEOUS
  Filled 2015-04-07 (×4): qty 0.4

## 2015-04-07 MED ORDER — SODIUM CHLORIDE 0.9% FLUSH
3.0000 mL | Freq: Two times a day (BID) | INTRAVENOUS | Status: DC
  Administered 2015-04-08 – 2015-04-10 (×2): 3 mL via INTRAVENOUS

## 2015-04-07 MED ORDER — ACETAMINOPHEN 325 MG PO TABS
650.0000 mg | ORAL_TABLET | Freq: Four times a day (QID) | ORAL | Status: DC | PRN
  Administered 2015-04-08: 650 mg via ORAL
  Filled 2015-04-07: qty 2

## 2015-04-07 MED ORDER — ACETAMINOPHEN 650 MG RE SUPP: 650.0000 mg | Freq: Four times a day (QID) | RECTAL | Status: DC | PRN

## 2015-04-07 MED ORDER — MORPHINE SULFATE (PF) 4 MG/ML IV SOLN
4.0000 mg | Freq: Once | INTRAVENOUS | Status: AC
  Administered 2015-04-07: 4 mg via INTRAVENOUS
  Filled 2015-04-07: qty 1

## 2015-04-07 MED ORDER — SODIUM CHLORIDE 0.9 % IV SOLN
INTRAVENOUS | Status: DC
Start: 2015-04-07 — End: 2015-04-11
  Administered 2015-04-07 – 2015-04-11 (×7): via INTRAVENOUS

## 2015-04-07 MED ORDER — ONDANSETRON HCL 4 MG/2ML IJ SOLN
4.0000 mg | Freq: Four times a day (QID) | INTRAMUSCULAR | Status: DC | PRN
  Administered 2015-04-07: 4 mg via INTRAVENOUS
  Filled 2015-04-07: qty 2

## 2015-04-07 MED ORDER — ONDANSETRON HCL 4 MG PO TABS: 4.0000 mg | ORAL_TABLET | Freq: Four times a day (QID) | ORAL | Status: DC | PRN

## 2015-04-07 NOTE — ED Notes (Signed)
Admitting at bedside 

## 2015-04-07 NOTE — Care Management Note (Signed)
Case Management Note  Patient Details  Name: Shannon Terry MRN: [id number] Date of Birth: [birth year]  Subjective/Objective:                  CC: Not speaking  Action/Plan: Cm spoke to the patient's husband at the bedside who states that they live together at home and at this time do not have any home health services but would consider it if necessary. Husband states that his daughter is able to help support and that they have an extensive family support system as well.  PT/OT eval pending and CM will remain available for possible home needs or discharge planning.  Expected Discharge Date:                  Expected Discharge Plan:  [place]  In-House Referral:     Discharge planning Services  CM Consult  Post Acute Care Choice:    Choice offered to:     DME Arranged:    DME Agency:     HH Arranged:    [place] Agency:     Status of Service:  In process, will continue to follow  Medicare Important Message Given:  Yes Date Medicare IM Given:    Medicare IM give by:    Date Additional Medicare IM Given:    Additional Medicare Important Message give by:     If discussed at [place] of Stay Meetings, dates discussed:    Additional Comments:  Guido Sander, RN 04/07/2015, 10:18 PM

## 2015-04-07 NOTE — H&P (Signed)
[facility] History and Physical  Shannon Terry [id number] DOB: 03/05/1941 DOA: 04/07/2015  Referring physician: Emergency Department PCP: No PCP Per Patient   CHIEF COMPLAINT:                   HPI: Shannon Terry is a 75 y.o. female with a rectal stromal tumor diagnosed Jan 2014. Marland Kitchen Patient was followed by Dr. Nena Alexander initially had had a good response to Gleevac  Patient stopped Gleevac in 2015 because it made her feel sick . Follow-up imaging last year showed a slight increase in tumor size. Patient last saw oncology April 2016 at which time it was documented that she was resistant to resuming treatment.    Patient brought to the emergency department today with altered mental status. She fell at home and has been nonverbal since. Her son and daughter at the bedside. Son is emotional, and leave the room. Daughter provides history. Per daughter, patient has managed fairly well from a pain standpoint. She paces the floor nurse, takes hot baths and takes Tylenol for the pain. Yesterday patient's husband heard a loud noise and found patient unconscious on the floor. Since then patient has not spoken very much, had very little by mouth intake. Now the patient is nonverbal.   ED COURSE:           Labs:   K 3.4, glucose 124, I.Ca 1.26, trop 0.0  Urinalysis:    Clear, negative leukocytes, negative nitrites  CXR:    No acute processes         EKG:    Sinus tachycardia HR 111 Left bundle branch block LBBB is new Confirmed by Alvino Chapel MD, Ovid Curd [phone]) on 04/07/2015 1:17:33 PM                 Review of Systems  Unable to perform ROS: medical condition    Past Medical History  Diagnosis Date  . Obesity     BMI 42 Jan 2014.   . Iron deficiency anemia due to chronic blood loss 04/05/12  . GERD (gastroesophageal reflux disease)   . Rectal cancer ([place]) 04/03/12    gastrointestinal stromal tumor  . Anemia, iron deficiency     chronic blood loss  . GI bleeding     hx of  .  Hemorrhoids   . Acute renal failure ([place])   . Gastritis without bleeding   . Anasarca   . Hematochezia     Prior to Diagniosis  . History of blood transfusion 04/01/12    Hgb. 2.9/ 6 Units of PRBC  . Malignant GIST ([place]) 05/30/2012   Past Surgical History  Procedure Laterality Date  . Abdominal hysterectomy      Partial  . Esophagogastroduodenoscopy  04/03/2012    Procedure: ESOPHAGOGASTRODUODENOSCOPY (EGD);  Surgeon: Jerene Bears, MD;  Location: [facility];  Service: Gastroenterology;  Laterality: N/A;  . Colonoscopy  04/03/2012    Procedure: COLONOSCOPY;  Surgeon: Jerene Bears, MD;  Location: [facility];  Service: Gastroenterology;  Laterality: N/A;  . Eus  04/08/2012    Procedure: LOWER ENDOSCOPIC ULTRASOUND (EUS);  Surgeon: Milus Banister, MD;  Location: [facility];  Service: Endoscopy;  Laterality: N/A;  . Biopsy stomach  04/03/12    neg for malignancy  . Rectal biopsy  04/03/12    gastrointestinal stromal tumor    SOCIAL HISTORY:  reports that she has never smoked. She has never used smokeless tobacco. She reports that she does not drink alcohol or use illicit  drugs. Lives:  At home with husband    Assistive devices:   None needed for ambulation.   No Known Allergies  Family History  Problem Relation Age of Onset  . Diabetes Mellitus II Father     died from stroke  . Leukemia Brother   . Pancreatic cancer Sister   . Heart attack Brother     Prior to Admission medications   Medication Sig Start Date End Date Taking? Authorizing Provider  acetaminophen (TYLENOL) 325 MG tablet Take 650 mg by mouth every 6 (six) hours as needed for mild pain.    Historical Provider, MD  docusate sodium (COLACE) 100 MG capsule Take 1 capsule (100 mg total) by mouth every 12 (twelve) hours. Patient not taking: Reported on 09/20/2014 06/15/14   Noemi Chapel, MD  megestrol (MEGACE ORAL) 40 MG/ML suspension Take 20 mLs (800 mg total) by mouth daily. Patient not taking: Reported on 06/15/2014  10/24/12   Volanda Napoleon, MD  oxyCODONE-acetaminophen (PERCOCET/ROXICET) 5-325 MG per tablet Take 1 tablet by mouth every 6 (six) hours as needed for severe pain. 09/20/14   Okey Regal, PA-C  SUNItinib (SUTENT) 12.5 MG capsule Take 4 pills a day for 28 days then 14 days off Patient not taking: Reported on 09/20/2014 06/19/14   Volanda Napoleon, MD  triamterene-hydrochlorothiazide (MAXZIDE-25) 37.5-25 MG per tablet Take 1 tablet by mouth daily. Patient not taking: Reported on 06/15/2014 04/30/13   Volanda Napoleon, MD   PHYSICAL EXAM: Filed Vitals:   04/07/15 1310 04/07/15 1345 04/07/15 1400 04/07/15 1450  BP: 166/100 163/93 174/87 165/91  Pulse: 110 103 103 99  Temp: 99.2 F (37.3 C)   98.8 F (37.1 C)  TempSrc: Oral   Oral  Resp: 16 23 25 21   SpO2: 100% 99% 99% 99%    Wt Readings from Last 3 Encounters:  09/20/14 81.647 kg (180 lb)  06/19/14 82.101 kg (181 lb)  06/14/14 83.598 kg (184 lb 4.8 oz)    General:  Thin black female.  Appears calm and comfortable Eyes: PER, normal lids, irises & conjunctiva ENT: grossly normal hearing, lips & tongue Neck: no LAD, no masses Cardiovascular: Slightly tachycardic, regular rhythm. No LE edema.  Respiratory: Respirations even and unlabored. Normal respiratory effort. Lungs CTA bilaterally, no wheezes / rales .   Abdomen: soft, non-distended, non-tender, active bowel sounds. No obvious masses.  Skin: no rash seen on limited exam Musculoskeletal: grossly normal tone BUE/BLE Psychiatric: grossly normal mood and affect, speech fluent and appropriate Neurologic: Makes eye contact , follows most commands . Good bilateral upper extremity strength . She does not move her lower extremities, not sure if this is intentional . Patient writes short answers on paper, squeezes my hand for yes.  Will not talk nor open mouth.          LABS ON ADMISSION:    Basic Metabolic Panel:  Recent Labs Lab 04/07/15 1356 04/07/15 1404  NA 143 144  K 3.5 3.4*    CL 110 108  CO2 25  --   GLUCOSE 130* 124*  BUN 12 14  CREATININE 0.65 0.60  CALCIUM 9.6  --    Liver Function Tests:  Recent Labs Lab 04/07/15 1356  AST 18  ALT 8*  ALKPHOS 94  BILITOT 0.1*  PROT 6.6  ALBUMIN 3.8    CBC:  Recent Labs Lab 04/07/15 1356 04/07/15 1404  WBC 7.7  --   NEUTROABS 6.6  --   HGB 9.4* 11.6*  HCT  30.1* 34.0*  MCV 66.9*  --   PLT 309  --     Creatinine clearance cannot be calculated (Unknown ideal weight.)  Radiological Exams on Admission: Ct Head Wo Contrast  04/07/2015  CLINICAL DATA:  Altered mental status, facial droop. EXAM: CT HEAD WITHOUT CONTRAST TECHNIQUE: Contiguous axial images were obtained from the base of the skull through the vertex without intravenous contrast. COMPARISON:  CT scan of June 15, 2014. FINDINGS: Bony calvarium appears intact. No mass effect or midline shift is noted. Ventricular size is within normal limits. There is no evidence of mass lesion, hemorrhage or acute infarction. IMPRESSION: Normal head CT. Electronically Signed   By: Marijo Conception, M.D.   On: 04/07/2015 14:29   Dg Chest Portable 1 View  04/07/2015  CLINICAL DATA:  Altered mental status today. History of rectal cancer and iron deficiency anemia. EXAM: PORTABLE CHEST 1 VIEW COMPARISON:  04/06/2012 portable chest. FINDINGS: 1314 hours. Cardiomegaly has improved compared with the prior study. There is mild vascular congestion, but no residual edema, confluent airspace opacity or pleural effusion. Moderate thoracic spine degenerative changes are present without acute osseous abnormality. IMPRESSION: No acute cardiopulmonary process. Interval decrease in the size of cardiac silhouette. Electronically Signed   By: Richardean Sale M.D.   On: 04/07/2015 13:24     ASSESSMENT / PLAN   Acute encephalopathy. Patient basically non-verbal since a fall at home yesterday.  Follows commands, will even write answers on paper. Head CTscan negative. Unclear why she is  nonverbal. No psychiatric history per daughter -admit to Observation -MRI brain with / without contrast -Neurology consult called -RN swallow test -PT, OT, SLP evaluation . -consider Psych consult if medical evaluation negative     Malignant rectal stromal tumor.  Patient chose to discontinue treatment in 2015.  CT scan of the abdomen and pelvis July 2016 did show enlargement of rectal mass measuring up to 8.4 centimeters. No evidence for obstruction on exam. Daughter reports some incontinence which could be overflow if constipated.  -Will consult Palliative Care about  goals of care  Hypertension. BP elevated. -Until clear to take PO, will give prn hydralazine  CONSULTANTS:   Neurology - Dr. Leonel Ramsay will see    Code Status: DNR DVT Prophylaxis: Lovenox  Family Communication:  Patient alert, oriented and understands plan of care.  Disposition Plan: Discharge to home in 24-48 hours   Time spent: 60 minutes Tye Savoy  NP [facility] Pager [phone]

## 2015-04-07 NOTE — ED Notes (Signed)
Admitting at bedside.  Patient responding to MD's questions with written responses and hand squeezing.

## 2015-04-07 NOTE — ED Notes (Signed)
Pt's grandaughter is now at bedside.  States her grandmother is hurting in her abdomen and her rectum.  States would like patient to have pain medicine.  When grandaughter asked the patient to squeeze her hand if she wanted pain medication patient did not squeeze after being asked 3 times.  Grandaughter states "I guess she doesn't want anything then".  Then grandaughter asked RN if she would retry swallow screen with grandaughter at bedside.  This RN attempted to get patient to do breath sounds.  Patient still not taking deep breaths.  This RN asked patient to cough, and patient would not follow command.  Patient was able to lick lips as requested.  Patient continues to be failed on swallow screen.

## 2015-04-07 NOTE — ED Notes (Signed)
Daughters at bedside , daughter said that pt has a tumor in rectum with some bleeding -- unable to take rectal temp.

## 2015-04-07 NOTE — ED Notes (Signed)
When this RN went in to do peri care and clean patient up after a bowel movement, this RN noted that the bowel movement seemed more bloody than previous BMs.

## 2015-04-07 NOTE — Consult Note (Signed)
Neurology Consultation Reason for Consult: Not speaking Referring Physician: Marily Memos, D  CC: Not speaking  History is obtained from: Patient  HPI: Shannon Terry is a 75 y.o. female with a history of rectal cancer not currently on treatment who presents with not talking. Per family, she has been "weaker" over the past couple of days. She also has had a mechanical fall yesterday. Today, she has not been speaking. When being evaluated by the nurse practitioner with the medical service, she asked her to write and the patient did right. When asked why she was not speaking, the patient wrote "I can talk I just don't have anything to say."  On my exam, the patient does not speak, but when I do ask her questions she does write responses including "cold hands" when I ask her if she feels light touch, "pain pill" when I ask her if she is in the pain. When I ask her if she is feeling down, she looks at me and stops writing.   LKW: Yesterday tpa given?: no, outside of window   ROS:  Unable to obtain due to Refusal to answer   Past Medical History  Diagnosis Date  . Obesity     BMI 42 Jan 2014.   . Iron deficiency anemia due to chronic blood loss 04/05/12  . GERD (gastroesophageal reflux disease)   . Rectal cancer ([place]) 04/03/12    gastrointestinal stromal tumor  . Anemia, iron deficiency     chronic blood loss  . GI bleeding     hx of  . Hemorrhoids   . Acute renal failure ([place])   . Gastritis without bleeding   . Anasarca   . Hematochezia     Prior to Diagniosis  . History of blood transfusion 04/01/12    Hgb. 2.9/ 6 Units of PRBC  . 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 Diabetes Mellitus II Father     died from stroke  . Leukemia Brother   . Pancreatic cancer Sister   . Heart attack Brother      Social History:  reports that she has never smoked. She has never used smokeless tobacco. She reports that she does not drink alcohol or use  illicit drugs.   Exam: Current vital signs: BP 182/90 mmHg  Pulse 93  Temp(Src) 98.8 F (37.1 C) (Oral)  Resp 21  SpO2 99% Vital signs in last 24 hours: Temp:  [98.8 F (37.1 C)-99.2 F (37.3 C)] 98.8 F (37.1 C) (02/01 1450) Pulse Rate:  [89-110] 93 (02/01 1830) Resp:  [16-25] 21 (02/01 1830) BP: (163-188)/(86-100) 182/90 mmHg (02/01 1830) SpO2:  [96 %-100 %] 99 % (02/01 1830)   Physical Exam  Constitutional: Appears well-developed and well-nourished.  Psych: Affect appropriate to situation Eyes: No scleral injection HENT: No OP obstrucion Head: Normocephalic.  Cardiovascular: Normal rate and regular rhythm.  Respiratory: Effort normal and breath sounds normal to anterior ascultation GI: Soft.  No distension. There is no tenderness.  Skin: WDI  Neuro: Mental Status: Patient is awake, alert, She does not speak but when given pen and paper, will write some.  Cranial Nerves: II: blinks to threat.  Pupils are equal, round, and reactive to light.   III,IV, VI: EOMI without ptosis or diploplia.  V: Facial sensation is symmetric to temperature VII: Facial movement is symmetric.  VIII: hearing is intact to voice X: Uvula elevates symmetrically XI: Shoulder shrug is symmetric. XII: does  not perform Motor: She does not fully comply, but does move all extremities.  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 Given the degree of concern, I think that an MRI/EEG to rule out any pathology is reasonable, but if this is negative, then would not pursue any further neurological workup.   Recommendations: 1) MRI brain 2) EEG 3) If above is negative, then no further recommendations [facility] psychiatry and neurology will sign  off.    Roland Rack, MD [facility] [phone]  If 7pm- 7am, please page neurology on call as listed in [place].

## 2015-04-07 NOTE — ED Notes (Signed)
Called to follow up on patient bed placement.   Will update family

## 2015-04-07 NOTE — ED Provider Notes (Signed)
CSN: [id number]     Arrival date & time 04/07/15  1259 History   First MD Initiated Contact with Patient 04/07/15 1259     Chief Complaint  Patient presents with  . Altered Mental Status     Level 5 caveat due to altered mental status.   Patient is a 75 y.o. female presenting with altered mental status. The history is provided by the patient.  Altered Mental Status Patient was brought in for AMS. Reportedly seen normal last night. Reportedly has been getting worse over last few days but much worse after the fall last night. She has a history of abdominal cancer that is no longer getting treated. Today is much less responsive. Will respond but will not speak. Will follow some commands. She has apparently been off all her other medicines 2. She had been on high blood pressure medicine. She has been eating less. Denies fever.  Past Medical History  Diagnosis Date  . Obesity     BMI 42 Jan 2014.   . Iron deficiency anemia due to chronic blood loss 04/05/12  . GERD (gastroesophageal reflux disease)   . Rectal cancer ([place]) 04/03/12    gastrointestinal stromal tumor  . Anemia, iron deficiency     chronic blood loss  . GI bleeding     hx of  . Hemorrhoids   . Acute renal failure ([place])   . Gastritis without bleeding   . Anasarca   . Hematochezia     Prior to Diagniosis  . History of blood transfusion 04/01/12    Hgb. 2.9/ 6 Units of PRBC  . 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 Abdominal hysterectomy      Partial  . Esophagogastroduodenoscopy  04/03/2012    Procedure: ESOPHAGOGASTRODUODENOSCOPY (EGD);  Surgeon: Jerene Bears, MD;  Location: [facility];  Service: Gastroenterology;  Laterality: N/A;  . Colonoscopy  04/03/2012    Procedure: COLONOSCOPY;  Surgeon: Jerene Bears, MD;  Location: [facility];  Service: Gastroenterology;  Laterality: N/A;  . Eus  04/08/2012    Procedure: LOWER ENDOSCOPIC ULTRASOUND (EUS);  Surgeon: Milus Banister,  MD;  Location: [facility];  Service: Endoscopy;  Laterality: N/A;  . Biopsy stomach  04/03/12    neg for malignancy  . Rectal biopsy  04/03/12    gastrointestinal stromal tumor   Family History  Problem Relation Age of Onset  . Diabetes Mellitus II Father     died from stroke  . Leukemia Brother   . Pancreatic cancer Sister   . Heart attack Brother    Social History  Substance Use Topics  . Smoking status: Never Smoker   . Smokeless tobacco: Never Used     Comment: never used tobacco  . Alcohol Use: No   OB History    Obstetric Comments   No HRT     Review of Systems  Unable to perform ROS: Mental status change      Allergies  Review of patient's allergies indicates no known allergies.  Home Medications   Prior to Admission medications   Medication Sig Start Date End Date Taking? Authorizing Provider  acetaminophen (TYLENOL) 325 MG tablet Take 650 mg by mouth every 6 (six) hours as needed for mild pain.    Historical Provider, MD  oxyCODONE-acetaminophen (PERCOCET/ROXICET) 5-325 MG per tablet Take 1 tablet by mouth every 6 (six) hours as needed for severe pain. 09/20/14   Jeffrey Hedges, PA-C   BP 165/91  mmHg  Pulse 99  Temp(Src) 98.8 F (37.1 C) (Oral)  Resp 21  SpO2 99% Physical Exam  Constitutional: She appears well-developed.  HENT:  Small hematoma to left parietal area  Eyes: EOM are normal.  Neck: Neck supple.  Cardiovascular: Normal rate.   Pulmonary/Chest: Effort normal.  Abdominal: Soft. There is no tenderness.  Musculoskeletal: Normal range of motion.  Neurological:  Patient is awake. Will follow commands such as squeezing hands and smiling but will not speak. May have a mild left-sided facial droop.  Skin: Skin is warm.    ED Course  Procedures (including critical care time) Labs Review Labs Reviewed  CBC - Abnormal; Notable for the following:    Hemoglobin 9.4 (*)    HCT 30.1 (*)    MCV 66.9 (*)    MCH 20.9 (*)    All other components  within normal limits  DIFFERENTIAL - Abnormal; Notable for the following:    Lymphs Abs 0.6 (*)    All other components within normal limits  COMPREHENSIVE METABOLIC PANEL - Abnormal; Notable for the following:    Glucose, Bld 130 (*)    ALT 8 (*)    Total Bilirubin 0.1 (*)    All other components within normal limits  I-STAT CHEM 8, ED - Abnormal; Notable for the following:    Potassium 3.4 (*)    Glucose, Bld 124 (*)    Hemoglobin 11.6 (*)    HCT 34.0 (*)    All other components within normal limits  ETHANOL  PROTIME-INR  APTT  URINALYSIS, ROUTINE W REFLEX MICROSCOPIC (NOT AT [facility])  URINE RAPID DRUG SCREEN, HOSP PERFORMED  I-STAT TROPOININ, ED    Imaging Review Ct Head Wo Contrast  04/07/2015  CLINICAL DATA:  Altered mental status, facial droop. EXAM: CT HEAD WITHOUT CONTRAST TECHNIQUE: Contiguous axial images were obtained from the base of the skull through the vertex without intravenous contrast. COMPARISON:  CT scan of June 15, 2014. FINDINGS: Bony calvarium appears intact. No mass effect or midline shift is noted. Ventricular size is within normal limits. There is no evidence of mass lesion, hemorrhage or acute infarction. IMPRESSION: Normal head CT. Electronically Signed   By: Marijo Conception, M.D.   On: 04/07/2015 14:29   Dg Chest Portable 1 View  04/07/2015  CLINICAL DATA:  Altered mental status today. History of rectal cancer and iron deficiency anemia. EXAM: PORTABLE CHEST 1 VIEW COMPARISON:  04/06/2012 portable chest. FINDINGS: 1314 hours. Cardiomegaly has improved compared with the prior study. There is mild vascular congestion, but no residual edema, confluent airspace opacity or pleural effusion. Moderate thoracic spine degenerative changes are present without acute osseous abnormality. IMPRESSION: No acute cardiopulmonary process. Interval decrease in the size of cardiac silhouette. Electronically Signed   By: Richardean Sale M.D.   On: 04/07/2015 13:24   I have  personally reviewed and evaluated these images and lab results as part of my medical decision-making.   EKG Interpretation   Date/Time:  Wednesday April 07 2015 13:05:22 EST Ventricular Rate:  111 PR Interval:  151 QRS Duration: 133 QT Interval:  362 QTC Calculation: 492 R Axis:   84 Text Interpretation:  Sinus tachycardia Left bundle branch block LBBB is  new Confirmed by Alvino Chapel  MD, Ovid Curd [phone]) on 04/07/2015 1:17:33 PM      MDM   Final diagnoses:  Altered mental status, unspecified altered mental status type  Malignant gastrointestinal stromal tumor, unspecified site  Anemia, unspecified anemia type  Patient with altered mental status. Worse after fall last night. Does have known rectal cancer that is not being treated anymore. She's also been off her medications. Had a fall last night but negative CT of the head for fracture or bleed. Has    not spoken for me but will follow some commands. Will admit to internal medicine.  Davonna Belling, MD 04/07/15 [phone]

## 2015-04-07 NOTE — ED Notes (Signed)
MD made aware of inability to check rectal temperature due to patient's tumor location.

## 2015-04-07 NOTE — Care Management Important Message (Signed)
Important Message  Patient Details  Name: Shannon Terry MRN: [id number] Date of Birth: Apr 09, 1940   Medicare Important Message Given:  Yes Cm provided obs letter to both husband, Purcell Nails, and daughter who was at the bedside.    Guido Sander, RN 04/07/2015, 10:17 PM

## 2015-04-07 NOTE — ED Notes (Signed)
To ED via [place] [facility] from home with c/o altered mental status. Pt fell last night, daughter checked on her this am, found her to be nonverbal, not moving, unable to stand. Will follow commands with encouragement , will track with eyes.

## 2015-04-07 NOTE — ED Notes (Signed)
Patient has one very small BM.  This RN cleaned patient up with soap and water and new pads applied to bed.

## 2015-04-08 ENCOUNTER — Observation Stay (HOSPITAL_COMMUNITY): Payer: Medicare Other

## 2015-04-08 DIAGNOSIS — R4182 Altered mental status, unspecified: Secondary | ICD-10-CM | POA: Diagnosis not present

## 2015-04-08 DIAGNOSIS — Z515 Encounter for palliative care: Secondary | ICD-10-CM

## 2015-04-08 DIAGNOSIS — I63521 Cerebral infarction due to unspecified occlusion or stenosis of right anterior cerebral artery: Secondary | ICD-10-CM

## 2015-04-08 DIAGNOSIS — I639 Cerebral infarction, unspecified: Secondary | ICD-10-CM | POA: Diagnosis not present

## 2015-04-08 DIAGNOSIS — R739 Hyperglycemia, unspecified: Secondary | ICD-10-CM | POA: Diagnosis present

## 2015-04-08 DIAGNOSIS — F329 Major depressive disorder, single episode, unspecified: Secondary | ICD-10-CM | POA: Diagnosis present

## 2015-04-08 DIAGNOSIS — C49A0 Gastrointestinal stromal tumor, unspecified site: Secondary | ICD-10-CM | POA: Diagnosis not present

## 2015-04-08 DIAGNOSIS — Z800 Family history of malignant neoplasm of digestive organs: Secondary | ICD-10-CM | POA: Diagnosis not present

## 2015-04-08 DIAGNOSIS — K219 Gastro-esophageal reflux disease without esophagitis: Secondary | ICD-10-CM | POA: Diagnosis present

## 2015-04-08 DIAGNOSIS — I63421 Cerebral infarction due to embolism of right anterior cerebral artery: Secondary | ICD-10-CM | POA: Diagnosis not present

## 2015-04-08 DIAGNOSIS — G9340 Encephalopathy, unspecified: Secondary | ICD-10-CM | POA: Diagnosis not present

## 2015-04-08 DIAGNOSIS — E876 Hypokalemia: Secondary | ICD-10-CM | POA: Diagnosis present

## 2015-04-08 DIAGNOSIS — Z85048 Personal history of other malignant neoplasm of rectum, rectosigmoid junction, and anus: Secondary | ICD-10-CM | POA: Diagnosis not present

## 2015-04-08 DIAGNOSIS — R29712 NIHSS score 12: Secondary | ICD-10-CM | POA: Diagnosis present

## 2015-04-08 DIAGNOSIS — Z823 Family history of stroke: Secondary | ICD-10-CM | POA: Diagnosis not present

## 2015-04-08 DIAGNOSIS — R000 Tachycardia, unspecified: Secondary | ICD-10-CM | POA: Diagnosis present

## 2015-04-08 DIAGNOSIS — I10 Essential (primary) hypertension: Secondary | ICD-10-CM | POA: Diagnosis present

## 2015-04-08 DIAGNOSIS — Z806 Family history of leukemia: Secondary | ICD-10-CM | POA: Diagnosis not present

## 2015-04-08 DIAGNOSIS — Z7189 Other specified counseling: Secondary | ICD-10-CM

## 2015-04-08 DIAGNOSIS — I6789 Other cerebrovascular disease: Secondary | ICD-10-CM | POA: Diagnosis not present

## 2015-04-08 DIAGNOSIS — C49A5 Gastrointestinal stromal tumor of rectum: Secondary | ICD-10-CM | POA: Diagnosis present

## 2015-04-08 DIAGNOSIS — F449 Dissociative and conversion disorder, unspecified: Secondary | ICD-10-CM | POA: Diagnosis not present

## 2015-04-08 DIAGNOSIS — Z8249 Family history of ischemic heart disease and other diseases of the circulatory system: Secondary | ICD-10-CM | POA: Diagnosis not present

## 2015-04-08 DIAGNOSIS — Z66 Do not resuscitate: Secondary | ICD-10-CM | POA: Diagnosis present

## 2015-04-08 DIAGNOSIS — R4701 Aphasia: Secondary | ICD-10-CM | POA: Diagnosis present

## 2015-04-08 DIAGNOSIS — I447 Left bundle-branch block, unspecified: Secondary | ICD-10-CM | POA: Diagnosis present

## 2015-04-08 DIAGNOSIS — D500 Iron deficiency anemia secondary to blood loss (chronic): Secondary | ICD-10-CM | POA: Diagnosis present

## 2015-04-08 LAB — CBC
HEMATOCRIT: 28.8 % — AB (ref 36.0–46.0)
Hemoglobin: 9 g/dL — ABNORMAL LOW (ref 12.0–15.0)
MCH: 20.8 pg — ABNORMAL LOW (ref 26.0–34.0)
MCHC: 31.3 g/dL (ref 30.0–36.0)
MCV: 66.5 fL — ABNORMAL LOW (ref 78.0–100.0)
Platelets: 384 10*3/uL (ref 150–400)
RBC: 4.33 MIL/uL (ref 3.87–5.11)
RDW: 15.3 % (ref 11.5–15.5)
WBC: 6.7 10*3/uL (ref 4.0–10.5)

## 2015-04-08 LAB — BASIC METABOLIC PANEL
ANION GAP: 12 (ref 5–15)
BUN: 11 mg/dL (ref 6–20)
CHLORIDE: 108 mmol/L (ref 101–111)
CO2: 25 mmol/L (ref 22–32)
Calcium: 9.4 mg/dL (ref 8.9–10.3)
Creatinine, Ser: 0.66 mg/dL (ref 0.44–1.00)
GFR calc non Af Amer: >60 mL/min (ref >60)
GLUCOSE: 109 mg/dL — AB (ref 65–99)
POTASSIUM: 3.4 mmol/L — AB (ref 3.5–5.1)
Sodium: 145 mmol/L (ref 135–145)

## 2015-04-08 MED ORDER — ASPIRIN EC 325 MG PO TBEC
325.0000 mg | DELAYED_RELEASE_TABLET | Freq: Every day | ORAL | Status: DC
  Administered 2015-04-09 – 2015-04-11 (×3): 325 mg via ORAL
  Filled 2015-04-08 (×4): qty 1

## 2015-04-08 MED ORDER — TRAMADOL HCL 50 MG PO TABS
50.0000 mg | ORAL_TABLET | Freq: Four times a day (QID) | ORAL | Status: DC | PRN
  Administered 2015-04-08 – 2015-04-10 (×3): 50 mg via ORAL
  Filled 2015-04-08 (×3): qty 1

## 2015-04-08 MED ORDER — GADOBENATE DIMEGLUMINE 529 MG/ML IV SOLN
15.0000 mL | Freq: Once | INTRAVENOUS | Status: AC | PRN
  Administered 2015-04-08: 13 mL via INTRAVENOUS

## 2015-04-08 MED ORDER — HYDRALAZINE HCL 20 MG/ML IJ SOLN
5.0000 mg | Freq: Four times a day (QID) | INTRAMUSCULAR | Status: DC | PRN
  Administered 2015-04-08 – 2015-04-10 (×2): 5 mg via INTRAVENOUS
  Filled 2015-04-08 (×3): qty 1

## 2015-04-08 MED ORDER — STROKE: EARLY STAGES OF RECOVERY BOOK
Freq: Once | Status: AC
  Administered 2015-04-08: 21:00:00
  Filled 2015-04-08: qty 1

## 2015-04-08 MED ORDER — POTASSIUM CHLORIDE CRYS ER 20 MEQ PO TBCR
20.0000 meq | EXTENDED_RELEASE_TABLET | Freq: Once | ORAL | Status: DC
  Filled 2015-04-08: qty 1

## 2015-04-08 NOTE — Evaluation (Signed)
Physical Therapy Evaluation Patient Details Name: Shannon Terry MRN: [id number] DOB: 1941-02-19 Today's Date: 04/08/2015   History of Present Illness  Shannon Terry is a 75 y.o. female with a history of rectal cancer not currently on treatment who presents with not talking. Per family, she has been "weaker" over the past couple of days. She also has had a mechanical fall yesterday. Today, she has not been speaking  Clinical Impression  Pt with the above medical history presenting today well below her baseline level of function. Family reported pt was Independent PTA until the last few days as she has become increasingly weaker. Pt slow to respond to questions yet has appropriate verbal response with increased time given. Requiring Min A for all mobility and transfers today with maximal VC's. Unable to attempt gait today as pt could not control BM upon standing. Will continue to follow pt acutely to access gait and balance. Family confident they can care for pt upon D/C with 24/7 supervision and assistance, PT agrees this would be appropriate with HHPT, as pt continues to become more medically stable.    Follow Up Recommendations Supervision/Assistance - 24 hour;Home health PT    Equipment Recommendations  Rolling walker with 5" wheels;Other (comment) (dependent upon ambualtion attempt)    Recommendations for Other Services       Precautions / Restrictions Precautions Precautions: Fall Restrictions Weight Bearing Restrictions: No      Mobility  Bed Mobility Overal bed mobility: Needs Assistance Bed Mobility: Supine to Sit     Supine to sit: Min assist     General bed mobility comments: Min A to reach sitting EOB, Use of hand rail and assist to scoot bring LEs to EOB and scoot hips  Transfers Overall transfer level: Needs assistance Equipment used: None Transfers: Sit to/from [facility] Sit to Stand: Min assist Stand pivot transfers: Min assist       General  transfer comment: Slow to initiate all movements but pt able to complete with minimal cueing  Ambulation/Gait             General Gait Details: deferred due to pt having BM and urinating upon standing  Stairs            Wheelchair Mobility    Modified Rankin (Stroke Patients Only)       Balance Overall balance assessment: Needs assistance Sitting-balance support: Feet unsupported;Single extremity supported Sitting balance-Leahy Scale: Fair     Standing balance support: Single extremity supported;During functional activity Standing balance-Leahy Scale: Fair                               Pertinent Vitals/Pain Pain Assessment: No/denies pain    Home Living Family/patient expects to be discharged to:: Private residence Living Arrangements: Spouse/significant other Available Help at Discharge: Family;Available 24 hours/day Type of Home: House Home Access: Stairs to enter Entrance Stairs-Rails: None Entrance Stairs-Number of Steps: 1 Home Layout: One level Home Equipment: None      Prior Function Level of Independence: Independent         Comments: last few days pt has been needing more help as she has gotten weaker     Hand Dominance   Dominant Hand: Right    Extremity/Trunk Assessment   Upper Extremity Assessment: Generalized weakness           Lower Extremity Assessment: Generalized weakness         Communication  Communication: Expressive difficulties  Cognition Arousal/Alertness: Lethargic Behavior During Therapy: Flat affect Overall Cognitive Status: Impaired/Different from baseline Area of Impairment: Orientation;Following commands;Awareness Orientation Level: Disoriented to;Time;Situation     Following Commands: Follows one step commands with increased time;Follows one step commands inconsistently   Awareness: Emergent   General Comments: Pt mostly non-verbal however answered two questions in complete sentences  regarding grandchildren, smiled at jokes, and follows commands with increased time    General Comments General comments (skin integrity, edema, etc.): Pt responding correctly to questions with increased time to answer, requiring Total A for hygiene standing at EOB    Exercises        Assessment/Plan    PT Assessment Patient needs continued PT services  PT Diagnosis Difficulty walking;Abnormality of gait;Generalized weakness;Altered mental status   PT Problem List Decreased strength;Decreased activity tolerance;Decreased balance;Decreased mobility;Decreased range of motion;Decreased coordination;Decreased cognition;Decreased knowledge of use of DME  PT Treatment Interventions Gait training;DME instruction;Functional mobility training;Therapeutic activities;Therapeutic exercise;Balance training   PT Goals (Current goals can be found in the Care Plan section) Acute Rehab PT Goals Patient Stated Goal: none stated PT Goal Formulation: With patient Time For Goal Achievement: 04/22/15 Potential to Achieve Goals: Fair    Frequency Min 3X/week   Barriers to discharge        Co-evaluation               End of Session Equipment Utilized During Treatment: Gait belt Activity Tolerance: Treatment limited secondary to medical complications (Comment) Patient left: in chair;with call bell/phone within reach;with chair alarm set;with family/visitor present Nurse Communication: Mobility status    Functional Assessment Tool Used: clinical observation Functional Limitation: Mobility: Walking and moving around Mobility: Walking and Moving Around Current Status [id number]): At least 20 percent but less than 40 percent impaired, limited or restricted Mobility: Walking and Moving Around Goal Status [phone]): At least 1 percent but less than 20 percent impaired, limited or restricted    Time: 0923-0952 PT Time Calculation (min) (ACUTE ONLY): 29 min   Charges:   PT Evaluation $PT Eval Moderate  Complexity: 1 Procedure PT Treatments $Therapeutic Activity: 8-22 mins   PT G Codes:   PT G-Codes **NOT FOR INPATIENT CLASS** Functional Assessment Tool Used: clinical observation Functional Limitation: Mobility: Walking and moving around Mobility: Walking and Moving Around Current Status [id number]): At least 20 percent but less than 40 percent impaired, limited or restricted Mobility: Walking and Moving Around Goal Status [phone]): At least 1 percent but less than 20 percent impaired, limited or restricted    Ara Kussmaul 04/08/2015, 11:55 AM  Ara Kussmaul, Student Physical Therapist Acute Rehab [phone]

## 2015-04-08 NOTE — Progress Notes (Signed)
Subjective: MRI shows stroke.   Occupational therapy states that she just said a fell sentence, but does not commonly speak.   Exam: Filed Vitals:   04/08/15 0439 04/08/15 0900  BP: 183/83 180/85  Pulse: 77 68  Temp: 98.3 F (36.8 C) 98.2 F (36.8 C)  Resp: 19 18   Gen: In bed, NAD Resp: non-labored breathing, no acute distress Abd: soft, nt  Neuro: MS: awake, alert, does nto speak.  [id number], blinks to therat.  Motor: does not comply with formal testing.  Sensory:intact to LT  Pertinent Labs: cmp - unremarkable  Impression: 75 yo F with no speech output in the setting of preserved language ability. This is right sided, and I wonder if this is a motivational deficit rather than true language deficit([place]). She will need a workup for secondary stroke prevention and anti-platelet therapy   Recommendations: 1) ASA 325mg  daily 2) carotid dopplers 3) direct LDL, a1c 4) stroke team to follow.  5) Speech therapy  Roland Rack, MD [facility] [phone]  If 7pm- 7am, please page neurology on call as listed in [place].

## 2015-04-08 NOTE — Progress Notes (Signed)
EEG completed; results pending.    

## 2015-04-08 NOTE — Evaluation (Signed)
Clinical/Bedside Swallow Evaluation Patient Details  Name: Shannon Terry MRN: [id number] Date of Birth: 1940/10/23  Today's Date: 04/08/2015 Time: SLP Start Time (ACUTE ONLY): 0841 SLP Stop Time (ACUTE ONLY): 0902 SLP Time Calculation (min) (ACUTE ONLY): 21 min  Past Medical History:  Past Medical History  Diagnosis Date  . Obesity     BMI 42 Jan 2014.   . Iron deficiency anemia due to chronic blood loss 04/05/12  . GERD (gastroesophageal reflux disease)   . Rectal cancer ([place]) 04/03/12    gastrointestinal stromal tumor  . Anemia, iron deficiency     chronic blood loss  . GI bleeding     hx of  . Hemorrhoids   . Acute renal failure ([place])   . Gastritis without bleeding   . Anasarca   . Hematochezia     Prior to Diagniosis  . History of blood transfusion 04/01/12    Hgb. 2.9/ 6 Units of PRBC  . 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 Abdominal hysterectomy      Partial  . Esophagogastroduodenoscopy  04/03/2012    Procedure: ESOPHAGOGASTRODUODENOSCOPY (EGD);  Surgeon: Jerene Bears, MD;  Location: [facility];  Service: Gastroenterology;  Laterality: N/A;  . Colonoscopy  04/03/2012    Procedure: COLONOSCOPY;  Surgeon: Jerene Bears, MD;  Location: [facility];  Service: Gastroenterology;  Laterality: N/A;  . Eus  04/08/2012    Procedure: LOWER ENDOSCOPIC ULTRASOUND (EUS);  Surgeon: Milus Banister, MD;  Location: [facility];  Service: Endoscopy;  Laterality: N/A;  . Biopsy stomach  04/03/12    neg for malignancy  . Rectal biopsy  04/03/12    gastrointestinal stromal tumor   HPI:  75 y.o. female with h/o rectal cancer currently refusing treatment, HTN, GERD, GI bleed who presented to ED with AMS after fall. Pt nonverbal since fall, very little PO intake. MRI Brain 2/1 acute moderate R ACA infarct, R cerebellar folia atrophy could represent prior infarct. CXR 2/1 no acute cardiopulmonary process. No previous SLP  intervention found on record.   Assessment / Plan / Recommendation Clinical Impression  SLP provided skilled observation of POs to assess swallow function. Pt demonstrated oral holding, impaired mastication, prolonged oral transit and pocketing in the right buccal cavity. Multiple swallows were noted after sip of thin liquids x2, however timely and efficient swallow observed when thin liquids taken with straw. When SLP explained purpose of swallow assessment again, no deficits observed. Unable to determine if these s/s are due to true dysphagia or refusal to cooperate. Pt and husband educated re: diet recommendation and continued f/u with SLP. Recommend dysphagia 2 diet and thin liquids via straw, meds crushed in puree due to behavior. SLP will request SLE order from MD. SLP d/w daughter via phone that pt able to follow commands and speak, just refusing. Pt able to communicate by writing with daughter last night. Possible cognitive deficits that could contribute to behavior    Aspiration Risk  Mild aspiration risk    Diet Recommendation Dysphagia 2 (Fine chop);Thin liquid   Liquid Administration via: Cup;Straw Medication Administration: Crushed with puree Supervision: Patient able to self feed;Intermittent supervision to cue for compensatory strategies Compensations: Minimize environmental distractions;Small sips/bites;Slow rate;Lingual sweep for clearance of pocketing Postural Changes: Seated upright at 90 degrees    Other  Recommendations Oral Care Recommendations: Oral care BID;Patient independent with oral care   Follow up Recommendations   (TBD)  Frequency and Duration min 2x/week  2 weeks       Prognosis Prognosis for Safe Diet Advancement: Good Barriers to Reach Goals: Behavior      Swallow Study   General HPI: 75 y.o. female with h/o rectal cancer currently refusing treatment, HTN, GERD, GI bleed who presented to ED with AMS after fall. Pt nonverbal since fall, very little  PO intake. MRI Brain 2/1 acute moderate R ACA infarct, R cerebellar folia atrophy could represent prior infarct. CXR 2/1 no acute cardiopulmonary process. No previous SLP intervention found on record. Type of Study: Bedside Swallow Evaluation Previous Swallow Assessment: none found Diet Prior to this Study: NPO Temperature Spikes Noted: No Respiratory Status: Room air History of Recent Intubation: No Behavior/Cognition: Alert;Pleasant mood;Cooperative;Requires cueing;Doesn't follow directions Oral Cavity Assessment: Within Functional Limits Oral Care Completed by SLP: Other (Comment) (completed by pt) Oral Cavity - Dentition: Adequate natural dentition Vision: Functional for self-feeding Self-Feeding Abilities: Able to feed self Patient Positioning: Upright in bed Baseline Vocal Quality: Low vocal intensity Volitional Cough: Weak Volitional Swallow: Unable to elicit    Oral/Motor/Sensory Function Overall Oral Motor/Sensory Function: Other (comment) (unable to assess as pt refused)   Ice Chips Ice chips: Not tested   Thin Liquid Thin Liquid: Impaired Presentation: Cup;Self Fed;Straw Oral Phase Functional Implications: Oral holding Pharyngeal  Phase Impairments: Multiple swallows    Nectar Thick Nectar Thick Liquid: Not tested   Honey Thick Honey Thick Liquid: Not tested   Puree Puree: Impaired Presentation: Self Fed;Spoon Oral Phase Functional Implications: Oral holding;Right lateral sulci pocketing Pharyngeal Phase Impairments: Multiple swallows   Solid   GO   Solid: Impaired Presentation: Self Fed Oral Phase Impairments: Impaired mastication Oral Phase Functional Implications: Oral holding;Impaired mastication;Prolonged oral transit        Titus Mould 04/08/2015,10:02 AM   Titus Mould, Student-SLP

## 2015-04-08 NOTE — Progress Notes (Signed)
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   Consultants:  Neurology  Procedures:  pending  Antibiotics:  None  HPI/Subjective: Pt has limited interaction with examiner. Caught her looking at my direction when I open the door but then she goes back to closing her eyes in supine position in bed.  Objective: Filed Vitals:   04/08/15 0439 04/08/15 0900  BP: 183/83 180/85  Pulse: 77 68  Temp: 98.3 F (36.8 C) 98.2 F (36.8 C)  Resp: 19 18    Intake/Output Summary (Last 24 hours) at 04/08/15 1548 Last data filed at 04/08/15 1400  Gross per 24 hour  Intake 1566.25 ml  Output    201 ml  Net 1365.25 ml   Filed Weights   04/07/15 2251  Weight: 66.271 kg (146 lb 1.6 oz)    Exam:   General:  Patient in no acute distress, with eyes closed laying supine in bed  Cardiovascular: S1 and S2 within normal limits, no rubs  Respiratory: No increased work of breathing, equal chest rise  Abdomen: Soft, nondistended, nontender  Musculoskeletal: No cyanosis on limited exam  Data Reviewed: Basic Metabolic Panel:  Recent Labs Lab 04/07/15 1356 04/07/15 1404 04/08/15 0500  NA 143 144 145  K 3.5 3.4* 3.4*  CL 110 108 108  CO2 25  --  25  GLUCOSE 130* 124* 109*  BUN 12 14 11    CREATININE 0.65 0.60 0.66  CALCIUM 9.6  --  9.4   Liver Function Tests:  Recent Labs Lab 04/07/15 1356  AST 18  ALT 8*  ALKPHOS 94  BILITOT 0.1*  PROT 6.6  ALBUMIN 3.8   No results for input(s): LIPASE, AMYLASE in the last 168 hours. No results for input(s): AMMONIA in the last 168 hours. CBC:  Recent Labs Lab 04/07/15 1356 04/07/15 1404 04/08/15 0500  WBC 7.7  --  6.7  NEUTROABS 6.6  --   --   HGB 9.4* 11.6* 9.0*  HCT 30.1* 34.0* 28.8*  MCV 66.9*  --  66.5*  PLT 309  --  384   Cardiac Enzymes: No results for input(s): CKTOTAL, CKMB, CKMBINDEX, TROPONINI in the last 168 hours. BNP (last 3 results) No results for input(s): BNP in the last 8760 hours.  ProBNP (last 3 results) No results for input(s): PROBNP in the last 8760 hours.  CBG: No results for input(s): GLUCAP in the last 168 hours.  No results found for this or any previous visit (from the past 240 hour(s)).   Studies: Ct Head Wo Contrast  04/07/2015  CLINICAL DATA:  Altered mental status, facial droop. EXAM: CT HEAD WITHOUT CONTRAST TECHNIQUE: Contiguous axial images were obtained from the base of the skull through the vertex without intravenous contrast. COMPARISON:  CT scan of June 15, 2014. FINDINGS: Bony calvarium appears intact. No mass effect or midline shift is noted. Ventricular  size is within normal limits. There is no evidence of mass lesion, hemorrhage or acute infarction. IMPRESSION: Normal head CT. Electronically Signed   By: Marijo Conception, M.D.   On: 04/07/2015 14:29   Mr Jeri Cos [id number] Contrast  04/08/2015  CLINICAL DATA:  Weaker for a few days, mechanical fall yesterday. Decreased speaking. Evaluate acute encephalopathy. History of rectal cancer. EXAM: MRI HEAD WITHOUT AND WITH CONTRAST TECHNIQUE: Multiplanar, multiecho pulse sequences of the brain and surrounding structures were obtained without and with intravenous contrast. CONTRAST:  71mL MULTIHANCE GADOBENATE DIMEGLUMINE 529 MG/ML IV SOLN  COMPARISON:  CT head April 07, 2015 FINDINGS: Patchy reduced diffusion RIGHT mesial frontal lobe extending to the periventricular white matter, rostrum of the corpus callosum. Corresponding low ADC values and mild FLAIR T2 hyperintense signal. The ventricles and sulci are normal for patient's age. No abnormal parenchymal signal, mass lesions, mass effect. Patchy supratentorial white matter FLAIR T2 hyperintensities. Mild RIGHT cerebellar peripheral atrophy. No abnormal parenchymal enhancement. No susceptibility artifact to suggest hemorrhage. No abnormal extra-axial fluid collections. No extra-axial masses nor leptomeningeal enhancement. Normal major intracranial vascular flow voids seen at the skull base. Ocular globes and orbital contents are unremarkable though not tailored for evaluation. No suspicious calvarial bone marrow signal. No abnormal sellar expansion. Craniocervical junction maintained. LEFT maxillary mucosal retention cyst. The mastoid air cells are well aerated. IMPRESSION: Acute moderate RIGHT anterior cerebral artery territory infarct. Involutional changes. Mild to moderate chronic small vessel ischemic disease. RIGHT cerebellar folia atrophy could represent prior infarct, possible sequelae of prior treatment or old trauma. Electronically Signed   By: Elon Alas M.D.   On: 04/08/2015 02:20   Dg Chest Portable 1 View  04/07/2015  CLINICAL DATA:  Altered mental status today. History of rectal cancer and iron deficiency anemia. EXAM: PORTABLE CHEST 1 VIEW COMPARISON:  04/06/2012 portable chest. FINDINGS: 1314 hours. Cardiomegaly has improved compared with the prior study. There is mild vascular congestion, but no residual edema, confluent airspace opacity or pleural effusion. Moderate thoracic spine degenerative changes are present without acute osseous abnormality. IMPRESSION: No acute cardiopulmonary process. Interval decrease in the size of cardiac silhouette. Electronically Signed   By:  Richardean Sale M.D.   On: 04/07/2015 13:24    Scheduled Meds: . aspirin EC  325 mg Oral Daily  . enoxaparin (LOVENOX) injection  40 mg Subcutaneous Q24H  . sodium chloride flush  3 mL Intravenous Q12H   Continuous Infusions: . sodium chloride 75 mL/hr at 04/08/15 1050    Time spent: > 35 minutes    Velvet Bathe  [facility] Pager [phone] If 7PM-7AM, please contact night-coverage at [email or web address], password [facility] 04/08/2015, 3:48 PM

## 2015-04-08 NOTE — Procedures (Signed)
History: 75 year old female with lack of speech output  Sedation: None  Technique: This is a 21 channel routine scalp EEG performed at the bedside with bipolar and monopolar montages arranged in accordance to the international 10/20 system of electrode placement. One channel was dedicated to EKG recording.    Background: The background consists of intermixed alpha and beta activities. There is a well defined posterior dominant rhythm of 9 Hz that attenuates with eye opening. There is a sharply contoured pattern seen in the left temporal region which I suspect represents wicket spikes. His is a normal variant. Sleep is recorded with normal appearing structures.   Photic stimulation: Physiologic driving is now performed  EEG Abnormalities: None  Clinical Interpretation: This normal EEG is recorded in the waking and sleep state. There was no seizure or seizure predisposition recorded on this study. Please note that a normal EEG does not preclude the possibility of epilepsy.   Roland Rack, MD [facility] [phone]  If 7pm- 7am, please page neurology on call as listed in [place].

## 2015-04-08 NOTE — Progress Notes (Signed)
Patient seen with Dr Domingo Cocking. Plan for follow up meeting tomorrow at 1730 with 3 children and husband. Primary contacts are husband Purcell Nails [phone]) and daughter Lelon Frohlich [phone]). Lawrence and Lelon Frohlich state that family and faith are very important to them. Patient was essentially non-verbal, only responding to one question about name pronunciation and only when family was not in the room. Dr Domingo Cocking plans to deliver [employer] book later today.

## 2015-04-08 NOTE — Evaluation (Signed)
Occupational Therapy Evaluation Patient Details Name: Shannon Terry MRN: [id number] DOB: [birth year] Today's Date: 04/08/2015    History of Present Illness Shannon Terry is a 75 y.o. female with a history of rectal cancer not currently on treatment who presents with not talking. Per family, she has been "weaker" over the past couple of days. She also has had a mechanical fall yesterday. Today, she has not been speaking   Clinical Impression   Pt admitted with above. She demonstrates the below listed deficits and will benefit from continued OT to maximize safety and independence with BADLs.  Pt requires min - total A for ADLs.  She was noted to have difficulty initiating activity, taking an excessively long time to do so, but once initiated, can string together a series of activities before completely stopping with no motor output and flat affect.  She demonstrated this stop and start pattern throughout the eval.  She was able to speak in full sentences, and at times string several sentences together, and then would stop talking or communicating.  She did say she can't wait until she is able to take care of herself again, and that she is having rectal pain and difficulty controlling bowels and bladder.  Once standing she ambulated in room and hallway with min A. Family is very supportive.   Will follow acutely. Recommend 24 hour supervision and HHOT.       Follow Up Recommendations  Home health OT;Supervision/Assistance - 24 hour    Equipment Recommendations  3 in 1 bedside comode;Tub/shower seat    Recommendations for Other Services       Precautions / Restrictions Precautions Precautions: Fall      Mobility Bed Mobility Overal bed mobility: Needs Assistance Bed Mobility: Sit to Supine     Supine to sit: Mod assist     General bed mobility comments: assist to move LEs onto bed   Transfers Overall transfer level: Needs assistance Equipment used: Rolling walker (2  wheeled) Transfers: Sit to/from [facility] Sit to Stand: Mod assist;Min assist Stand pivot transfers: Min assist       General transfer comment: Pt required mod A to power up into standing x 1, and min guard and min A for other times     Balance Overall balance assessment: Needs assistance Sitting-balance support: Feet supported Sitting balance-Leahy Scale: Good     Standing balance support: During functional activity Standing balance-Leahy Scale: Fair                              ADL Overall ADL's : Needs assistance/impaired Eating/Feeding: Sitting Eating/Feeding Details (indicate cue type and reason): Pt is able to feed herself ~25% of meal and drink from cup.  She appears to have difficulty initiating task.                   Lower Body Dressing: Moderate assistance;Sit to/from stand Lower Body Dressing Details (indicate cue type and reason): Pt able to don socks when they were handed to her, but later unable to initiate activity  Toilet Transfer: Minimal assistance;Ambulation;Comfort height toilet;Regular Toilet;BSC;RW   Toileting- Clothing Manipulation and Hygiene: Total assistance Toileting - Clothing Manipulation Details (indicate cue type and reason): Pt incontinent of stool and urine.  She does not appear able to initiate asking for assistance.  She states "it's easier to just go to the bathroom here", and she reports pain in rectal area  Functional mobility during ADLs: Minimal assistance;Moderate assistance;Rolling walker General ADL Comments: Pt with variable performance throughout eval.  She requires an extensive amount of time to inititate a task, and then will stop with little to no motor output     Vision Additional Comments: Pt unable to participate in visual assessment.  She was noted to be able to negotiate obstacles in room, and locate food on tray    Perception     [place] tested?: Deficits Deficits:  Initiation;Ideomotor;Motor Impersistence Praxis-Other Comments: Very difficult to accurately assess and identify deficits as behaviors/activities start and stop. She takes an extensive amount of time to initiate a task, sometimes requiring hand over hand assist.  Once initiatied she can repetetively perfom tasks, but then will stop with no motor affect and flat affect     Pertinent Vitals/Pain Pain Assessment: Faces Faces Pain Scale: Hurts little more Pain Location: rectal  Pain Descriptors / Indicators: Grimacing Pain Intervention(s): Monitored during session     Hand Dominance Right   Extremity/Trunk Assessment Upper Extremity Assessment Upper Extremity Assessment: Overall WFL for tasks assessed   Lower Extremity Assessment Lower Extremity Assessment: Defer to PT evaluation   Cervical / Trunk Assessment Cervical / Trunk Assessment: Normal   Communication Communication Communication: Expressive difficulties   Cognition Arousal/Alertness: Lethargic Behavior During Therapy: Flat affect                   General Comments: unable to determine due to language deficits, deficits with initiation, and motor planning deficits    General Comments       Exercises       Shoulder Instructions      Home Living Family/patient expects to be discharged to:: Private residence Living Arrangements: Spouse/significant other Available Help at Discharge: Family;Available 24 hours/day Type of Home: House Home Access: Stairs to enter [employer] of Steps: 1 Entrance Stairs-Rails: None Home Layout: One level     Bathroom Shower/Tub: Occupational psychologist: Standard     Home Equipment: None          Prior Functioning/Environment Level of Independence: Independent        Comments: Pt reports she drives     OT Diagnosis: Generalized weakness;Cognitive deficits;Apraxia   OT Problem List: Decreased strength;Decreased activity tolerance;Impaired  balance (sitting and/or standing);Decreased cognition;Decreased safety awareness;Decreased knowledge of use of DME or AE   OT Treatment/Interventions: Self-care/ADL training;DME and/or AE instruction;Therapeutic activities;Cognitive remediation/compensation;Patient/family education;Balance training    OT Goals(Current goals can be found in the care plan section) Acute Rehab OT Goals Patient Stated Goal: "I'll be glad when I can get back to taking care of myself" OT Goal Formulation: With patient/family Time For Goal Achievement: 04/22/15 Potential to Achieve Goals: Good ADL Goals Pt Will Perform Eating: with set-up;with supervision;sitting Pt Will Perform Grooming: with min guard assist;standing Pt Will Perform Upper Body Bathing: with supervision;sitting Pt Will Perform Lower Body Bathing: with min assist;sit to/from stand Pt Will Perform Upper Body Dressing: with min assist;sitting Pt Will Perform Lower Body Dressing: with min assist;sit to/from stand Pt Will Transfer to Toilet: with min guard assist;ambulating;bedside commode;regular height toilet;grab bars Pt Will Perform Toileting - Clothing Manipulation and hygiene: with min guard assist;sit to/from stand  OT Frequency: Min 3X/week   Barriers to D/C:            Co-evaluation              End of Session Equipment Utilized During Treatment: Rolling walker  Nurse Communication: Mobility status  Activity Tolerance: Patient tolerated treatment well Patient left: in bed;with call bell/phone within reach;with family/visitor present   Time: [id number] OT Time Calculation (min): 87 min Charges:  OT General Charges $OT Visit: 1 Procedure OT Evaluation $OT Eval Moderate Complexity: 1 Procedure OT Treatments $Self Care/Home Management : 68-82 mins G-Codes:    Shannon Terry M 04-18-2015, 5:25 PM

## 2015-04-08 NOTE — Consult Note (Signed)
.                                                                           Consultation Note Date: 04/08/2015   Patient Name: Shannon Terry  DOB: 08/25/1940  MRN: [id number]  Age / Sex: 75 y.o., female  PCP: No Pcp Per Patient Referring Physician: Velvet Bathe, MD  Reason for Consultation: Establishing goals of care  Clinical Assessment/Narrative: Ms. Herko is a 75 y.o. female with a history of rectal stromal tumor who followed in the past with Dr. Marin Olp, but has discontinued treatments.  She is largely nonverbal.  On entering room, her daughter left to get father.  At that time I asked the patient how to pronounce her name and she replied "we pronounce it [place]." Her daughter and husband then entered room, and she really did not speak for the rest of the encounter.   Daughter reports she has been "weak" over the past couple of days, fell, and has not been speaking since that time.  The most important things to the patient are her family, her faith, and being at home. She lives with her husband and has three children in town.  Her daughter reports her family understands that she has cancer and no longer wants to pursue treatment.  She is very reluctant to go to the doctor, and she has been having uncontrolled pain at home related to her disease.  I discussed with the patient and her family that the hospital can be useful as long as she is getting well enough from care she receives at the hospital to enjoy time at home, but there is going to come a time in the near future where, if her goal is to be at home, she may be better served to plan on being at home and bringing care to him at home rather repeated trips to the hospital.  It seems this would be extremely beneficial at this point as she has been avoiding the doctor and having increased suffering as a result.  We discussed hospice as a tool that would be beneficial in this goal as we have reached a point where we are trying to fix problems that are  not fixable.  We also discussed that even if there is something that is found on testing, it is possible that it may not be something that is reversible and the reality is that this may be her new baseline moviong  Her daughter and husband report being thankful for the information we discussed.  Contacts/Participants in Discussion: Patient, her daughter, and husband  SUMMARY OF RECOMMENDATIONS - Had long discussion with patient and her family regarding her current clinical course as well as options for care moving forward. - She has 2 other children who need to be part of conversation. Will plan on meeting again tomorrow 5:30 to continue goals of care conversation.  Code Status/Advance Care Planning: DNR    Code Status Orders        Start     Ordered   04/07/15 2012  Do not attempt resuscitation (DNR)   Continuous    Question Answer Comment  In the event of cardiac or respiratory ARREST Do not call a "code  blue"   In the event of cardiac or respiratory ARREST Do not perform Intubation, CPR, defibrillation or ACLS   In the event of cardiac or respiratory ARREST Use medication by any route, position, wound care, and other measures to relive pain and suffering. May use oxygen, suction and manual treatment of airway obstruction as needed for comfort.      04/07/15 2011    Code Status History    Date Active Date Inactive Code Status Order ID Comments User Context   04/07/2015  6:32 PM 04/07/2015  8:11 PM DNR [id number]  Waldemar Dickens, MD ED   04/01/2012  2:27 AM 04/06/2012  2:30 PM Full Code [id number]  Tvedt Londell Moh, RN Inpatient    Advance Directive Documentation        Most Recent Value   Type of Advance Directive  -- [DNR]   Pre-existing out of facility DNR order (yellow form or pink MOST form)     "MOST" Form in Place?       Symptom Management:   Patient largely nonverbal. When asked multiple times, she denied complaints at this time.   Palliative Prophylaxis:   Bowel  Regimen, Delirium Protocol and Frequent Pain Assessment  Psycho-social/Spiritual:  Support System: Fair Additional Recommendations: Education on Hospice  Prognosis: Less than six months and she would qualify for hospice is desired  Discharge Planning: To be determined. Likely Skilled facility for rehab vs. home with hospice support   Chief Complaint/ Primary Diagnoses: Present on Admission:  . Acute encephalopathy . 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 The following aspects are pertinent.  Past Medical History  Diagnosis Date  . Obesity     BMI 42 Jan 2014.   . Iron deficiency anemia due to chronic blood loss 04/05/12  . GERD (gastroesophageal reflux disease)   . Rectal cancer ([place]) 04/03/12    gastrointestinal stromal tumor  . Anemia, iron deficiency     chronic blood loss  . GI bleeding     hx of  . Hemorrhoids   . Acute renal failure ([place])   . Gastritis without bleeding   . Anasarca   . Hematochezia     Prior to Diagniosis  . History of blood transfusion 04/01/12    Hgb. 2.9/ 6 Units of PRBC  . 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 Marital Status: Married    Spouse Name: N/A  . Number of Children: 3  . Years of Education: N/A   Social History Main Topics  . Smoking status: Never Smoker   . Smokeless tobacco: Never Used     Comment: never used tobacco  . Alcohol Use: No  . Drug Use: No  . Sexual Activity: Not Asked   Other Topics Concern  . None   Social History Narrative   Family History  Problem Relation Age of Onset  . Diabetes Mellitus II Father     died from stroke  . Leukemia Brother   . Pancreatic cancer Sister   . Heart attack Brother    Scheduled Meds: . enoxaparin (LOVENOX) injection  40 mg Subcutaneous Q24H  . sodium chloride flush  3 mL Intravenous Q12H   Continuous Infusions: . sodium chloride 75 mL/hr at 04/08/15 1050     PRN Meds:.acetaminophen **OR** acetaminophen, ondansetron **OR** ondansetron (ZOFRAN) IV Medications Prior to Admission:  Prior to Admission medications   Medication Sig Start  Date End Date Taking? Authorizing Provider  acetaminophen (TYLENOL) 325 MG tablet Take 650 mg by mouth every 6 (six) hours as needed for mild pain.   Yes Historical Provider, MD  oxyCODONE-acetaminophen (PERCOCET/ROXICET) 5-325 MG tablet Take 1 tablet by mouth every 6 (six) hours as needed for moderate pain or severe pain.   Yes Historical Provider, MD   No Known Allergies  Review of Systems  Patient does not participate  Physical Exam Constitutional: Thin, but appears well-developed and well-nourished.  Psych: unable to assess, patient nonverbal Eyes: No scleral injection Head: Normocephalic.  Cardiovascular: Regular rate and regular rhythm, no murmur.  Respiratory: Effort normal and breath sounds normal GI: Soft. No distension. There is no tenderness.  Skin: Warm and dry  Vital Signs: BP 180/85 mmHg  Pulse 68  Temp(Src) 98.2 F (36.8 C) (Oral)  Resp 18  Wt 66.271 kg (146 lb 1.6 oz)  SpO2 98%  SpO2: SpO2: 98 % O2 Device:SpO2: 98 % O2 Flow Rate: .   IO: Intake/output summary:  Intake/Output Summary (Last 24 hours) at 04/08/15 1310 Last data filed at 04/08/15 [id number]  Gross per 24 hour  Intake 966.25 ml  Output    576 ml  Net 390.25 ml    LBM: Last BM Date: 04/07/15 Baseline Weight: Weight: 66.271 kg (146 lb 1.6 oz) Most recent weight: Weight: 66.271 kg (146 lb 1.6 oz)      Palliative Assessment/Data:  Flowsheet Rows        Most Recent Value   Intake Tab    Referral Department  Hospitalist   Unit at Time of Referral  Med/Surg Unit   Palliative Care Primary Diagnosis  Cancer   Date Notified  04/07/15   Palliative Care Type  New Palliative care   Reason for referral  Clarify Goals of Care   Date of Admission  04/07/15   Date first seen by Palliative Care  04/08/15   # of days  Palliative referral response time  1 Day(s)   # of days IP prior to Palliative referral  0   Clinical Assessment    Palliative Performance Scale Score  50%   Pain Max last 24 hours  Not able to report   Pain Min Last 24 hours  Not able to report   Psychosocial & Spiritual Assessment    Palliative Care Outcomes    Patient/Family meeting held?  Yes   Who was at the meeting?  patient, husband, daughter   [place] regarding hospice, Provided advance care planning      Additional Data Reviewed:  CBC:    Component Value Date/Time   WBC 6.7 04/08/2015 0500   WBC 3.7* 06/19/2014 1314   HGB 9.0* 04/08/2015 0500   HGB 12.5 06/19/2014 1314   HCT 28.8* 04/08/2015 0500   HCT 39.2 06/19/2014 1314   PLT 384 04/08/2015 0500   PLT 267 06/19/2014 1314   MCV 66.5* 04/08/2015 0500   MCV 71* 06/19/2014 1314   NEUTROABS 6.6 04/07/2015 1356   NEUTROABS 1.6 06/19/2014 1314   LYMPHSABS 0.6* 04/07/2015 1356   LYMPHSABS 1.4 06/19/2014 1314   MONOABS 0.5 04/07/2015 1356   EOSABS 0.0 04/07/2015 1356   EOSABS 0.2 06/19/2014 1314   BASOSABS 0.0 04/07/2015 1356   BASOSABS 0.0 06/19/2014 1314   Comprehensive Metabolic Panel:    Component Value Date/Time   NA 145 04/08/2015 0500   NA 143 06/20/2013 1052   K 3.4* 04/08/2015 0500   K 3.6 06/20/2013 1052  CL 108 04/08/2015 0500   CL 106 06/20/2013 1052   CO2 25 04/08/2015 0500   CO2 31 06/20/2013 1052   BUN 11 04/08/2015 0500   BUN 15 06/20/2013 1052   CREATININE 0.66 04/08/2015 0500   CREATININE 0.8 06/20/2013 1052   GLUCOSE 109* 04/08/2015 0500   GLUCOSE 86 06/20/2013 1052   CALCIUM 9.4 04/08/2015 0500   CALCIUM 9.5 06/20/2013 1052   AST 18 04/07/2015 1356   AST 31 06/20/2013 1052   ALT 8* 04/07/2015 1356   ALT 22 06/20/2013 1052   ALKPHOS 94 04/07/2015 1356   ALKPHOS 104* 06/20/2013 1052   BILITOT 0.1* 04/07/2015 1356   BILITOT 0.60 06/20/2013 1052   PROT 6.6 04/07/2015 1356   PROT 7.7 06/20/2013 1052    ALBUMIN 3.8 04/07/2015 1356   ALBUMIN 4.0 06/20/2013 1052     Time In:0920 Time Out: 1025 Time Total: 65 Greater than 50%  of this time was spent counseling and coordinating care related to the above assessment and plan.  Signed by: Micheline Rough, MD  Micheline Rough, MD  04/08/2015, 1:10 PM  Please contact Palliative Medicine Team phone at [phone] for questions and concerns.

## 2015-04-09 ENCOUNTER — Inpatient Hospital Stay (HOSPITAL_COMMUNITY): Payer: Medicare Other

## 2015-04-09 DIAGNOSIS — I6789 Other cerebrovascular disease: Secondary | ICD-10-CM

## 2015-04-09 DIAGNOSIS — F449 Dissociative and conversion disorder, unspecified: Secondary | ICD-10-CM | POA: Insufficient documentation

## 2015-04-09 DIAGNOSIS — I63421 Cerebral infarction due to embolism of right anterior cerebral artery: Principal | ICD-10-CM

## 2015-04-09 DIAGNOSIS — C20 Malignant neoplasm of rectum: Secondary | ICD-10-CM | POA: Insufficient documentation

## 2015-04-09 DIAGNOSIS — I639 Cerebral infarction, unspecified: Secondary | ICD-10-CM | POA: Insufficient documentation

## 2015-04-09 LAB — LIPID PANEL
CHOL/HDL RATIO: 3.3 ratio
CHOLESTEROL: 150 mg/dL (ref 0–200)
HDL: 46 mg/dL (ref >40)
LDL CALC: 85 mg/dL (ref 0–99)
Triglycerides: 94 mg/dL (ref <150)
VLDL: 19 mg/dL (ref 0–40)

## 2015-04-09 LAB — HEMOGLOBIN A1C
Hgb A1c MFr Bld: 5.4 % (ref 4.8–5.6)
Mean Plasma Glucose: 108 mg/dL

## 2015-04-09 LAB — LDL CHOLESTEROL, DIRECT: Direct LDL: 86 mg/dL (ref 0–99)

## 2015-04-09 MED ORDER — HYDRALAZINE HCL 20 MG/ML IJ SOLN
10.0000 mg | Freq: Once | INTRAMUSCULAR | Status: AC
  Administered 2015-04-09: 10 mg via INTRAVENOUS

## 2015-04-09 NOTE — Progress Notes (Signed)
OT Cancellation Note  Patient Details Name: Shannon Terry MRN: [id number] DOB: 06/12/1940   Cancelled Treatment:    Reason Eval/Treat Not Completed: Fatigue/lethargy limiting ability to participate  [place], [place], OTR/L [id number]  04/09/2015, 5:15 PM

## 2015-04-09 NOTE — Progress Notes (Signed)
Physical Therapy Treatment Patient Details Name: Shannon Terry MRN: [id number] DOB: 1940-08-31 Today's Date: 04/09/2015    History of Present Illness Shannon Terry is a 75 y.o. female with a history of rectal cancer not currently on treatment who presents with not talking. Per family, she has been "weaker" over the past couple of days. She also has had a mechanical fall yesterday. Today, she has not been speaking. MRI revealed Acute moderate RIGHT anterior cerebral artery territory infarct.    PT Comments    Making good progress towards functional mobility. Tends to neglect visual scanning to left while ambulating. Required min assist for balance and navigates with RW for light support. Difficulty with executive functions requiring tactile and verbal cues intermittently. Daughter present and very supportive. Patient will continue to benefit from skilled physical therapy services to further improve independence with functional mobility.   Follow Up Recommendations  Supervision/Assistance - 24 hour;Home health PT     Equipment Recommendations   (family reports they have a RW)    Recommendations for Other Services       Precautions / Restrictions Precautions Precautions: Fall Restrictions Weight Bearing Restrictions: No    Mobility  Bed Mobility               General bed mobility comments: Pt received standing with daughter  Transfers Overall transfer level: Needs assistance Equipment used: Rolling walker (2 wheeled) Transfers: Sit to/from Stand Sit to Stand: Min assist         General transfer comment: Tactile and verbal cues to facilitate transfer into recliner. Tactile and verbal cues for hand placement and safety.  Ambulation/Gait Ambulation/Gait assistance: Min assist Ambulation Distance (Feet): 250 Feet Assistive device: Rolling walker (2 wheeled) Gait Pattern/deviations: Step-through pattern;Decreased stride length;Staggering left;Staggering right;Drifts  right/left Gait velocity: decreased Gait velocity interpretation: Below normal speed for age/gender General Gait Details: Fairly stable throughout majority of bout. Has trouble looking left while ambulating in straight path (possible neglect). Cues to look left frequently but with poor response vs inability. Occasional stagger requiring min assist for stability. No buckling noted.   Stairs Stairs:  (declines due to no shoes)          Wheelchair Mobility    Modified Rankin (Stroke Patients Only)       Balance                                    Cognition Arousal/Alertness: Awake/alert Behavior During Therapy: Flat affect Overall Cognitive Status: Impaired/Different from baseline Area of Impairment: Following commands;Awareness;Attention   Current Attention Level: Focused   Following Commands: Follows one step commands with increased time;Follows one step commands inconsistently   Awareness: Emergent   General Comments: Still mostly non-verbal.    Exercises      General Comments General comments (skin integrity, edema, etc.): Daughter present and supportive      Pertinent Vitals/Pain Pain Assessment: No/denies pain Pain Intervention(s): Monitored during session    Home Living     Available Help at Discharge: Family;Available 24 hours/day Type of Home: House              Prior Function            PT Goals (current goals can now be found in the care plan section) Acute Rehab PT Goals PT Goal Formulation: With patient Time For Goal Achievement: 04/22/15 Potential to Achieve Goals: Fair Progress towards PT goals: Progressing toward  goals    Frequency  Min 3X/week    PT Plan Current plan remains appropriate    Co-evaluation             End of Session Equipment Utilized During Treatment: Gait belt Activity Tolerance: Patient tolerated treatment well Patient left: in chair;with call bell/phone within reach;with chair alarm  set;with family/visitor present     Time: 1207-1221 PT Time Calculation (min) (ACUTE ONLY): 14 min  Charges:  $Gait Training: 8-22 mins                    G Codes:      Ellouise Newer 04/12/2015, 12:54 PM  Camille Bal Seth Ward, [place]

## 2015-04-09 NOTE — Progress Notes (Signed)
Daily Progress Note   Patient Name: Shannon Terry       Date: 04/09/2015 DOB: 1940/05/24  Age: 75 y.o. MRN#: [id number] Attending Physician: Velvet Bathe, MD Primary Care Physician: No PCP Per Patient Admit Date: 04/07/2015  Reason for Consultation/Follow-up: Establishing goals of care  Subjective: I met with family for family meeting this afternoon.  Met with patient, her husband, her three daughters, and her son.    The patient remained minimally involved in discussion.  She would occasionally nod yes or no, but often remained disengaged even with multiple people attempting to engage her in discussion.  See goals below.  Length of Stay: 1 day  Current Medications: Scheduled Meds:  . aspirin EC  325 mg Oral Daily  . enoxaparin (LOVENOX) injection  40 mg Subcutaneous Q24H  . potassium chloride  20 mEq Oral Once  . sodium chloride flush  3 mL Intravenous Q12H    Continuous Infusions: . sodium chloride 75 mL/hr at 04/08/15 2146    PRN Meds: acetaminophen **OR** acetaminophen, hydrALAZINE, ondansetron **OR** ondansetron (ZOFRAN) IV, traMADol  Physical Exam: Physical Exam     General: Patient in no acute distress, with eyes closed laying in bed most of encounter.  She does intermittently open her eyes and very infrequent nodding with repeated prompting  Cardiovascular: S1 and S2 within normal limits, no rubs  Respiratory: No increased work of breathing, equal chest rise  Abdomen: Soft, nondistended, nontender        Vital Signs: BP 136/69 mmHg  Pulse 91  Temp(Src) 99 F (37.2 C) (Oral)  Resp 17  Wt 66.271 kg (146 lb 1.6 oz)  SpO2 100% SpO2: SpO2: 100 % O2 Device: O2 Device: Not Delivered O2 Flow Rate:    Intake/output summary:  Intake/Output Summary (Last 24 hours)  at 04/09/15 2246 Last data filed at 04/09/15 2235  Gross per 24 hour  Intake    180 ml  Output      0 ml  Net    180 ml   LBM: Last BM Date: 04/07/15 Baseline Weight: Weight: 66.271 kg (146 lb 1.6 oz) Most recent weight: Weight: 66.271 kg (146 lb 1.6 oz)       Palliative Assessment/Data: Flowsheet Rows        Most Recent Value   Intake Tab  Referral Department  Hospitalist   Unit at Time of Referral  Med/Surg Unit   Palliative Care Primary Diagnosis  Cancer   Date Notified  04/07/15   Palliative Care Type  New Palliative care   Reason for referral  Clarify Goals of Care   Date of Admission  04/07/15   Date first seen by Palliative Care  04/08/15   # of days Palliative referral response time  1 Day(s)   # of days IP prior to Palliative referral  0   Clinical Assessment    Palliative Performance Scale Score  50%   Pain Max last 24 hours  Not able to report   Pain Min Last 24 hours  Not able to report   Psychosocial & Spiritual Assessment    Palliative Care Outcomes    Patient/Family meeting held?  Yes   Who was at the meeting?  patient, husband, daughter   Palliative Care Outcomes  Counseled regarding hospice, Provided advance care planning      Additional Data Reviewed: CBC    Component Value Date/Time   WBC 6.7 04/08/2015 0500   WBC 3.7* 06/19/2014 1314   RBC 4.33 04/08/2015 0500   RBC 5.54* 06/19/2014 1314   RBC 3.94 09/13/2012 1103   HGB 9.0* 04/08/2015 0500   HGB 12.5 06/19/2014 1314   HCT 28.8* 04/08/2015 0500   HCT 39.2 06/19/2014 1314   PLT 384 04/08/2015 0500   PLT 267 06/19/2014 1314   MCV 66.5* 04/08/2015 0500   MCV 71* 06/19/2014 1314   MCH 20.8* 04/08/2015 0500   MCH 22.6* 06/19/2014 1314   MCHC 31.3 04/08/2015 0500   MCHC 31.9* 06/19/2014 1314   RDW 15.3 04/08/2015 0500   RDW 16.4* 06/19/2014 1314   LYMPHSABS 0.6* 04/07/2015 1356   LYMPHSABS 1.4 06/19/2014 1314   MONOABS 0.5 04/07/2015 1356   EOSABS 0.0 04/07/2015 1356   EOSABS 0.2  06/19/2014 1314   BASOSABS 0.0 04/07/2015 1356   BASOSABS 0.0 06/19/2014 1314    CMP     Component Value Date/Time   NA 145 04/08/2015 0500   NA 143 06/20/2013 1052   K 3.4* 04/08/2015 0500   K 3.6 06/20/2013 1052   CL 108 04/08/2015 0500   CL 106 06/20/2013 1052   CO2 25 04/08/2015 0500   CO2 31 06/20/2013 1052   GLUCOSE 109* 04/08/2015 0500   GLUCOSE 86 06/20/2013 1052   BUN 11 04/08/2015 0500   BUN 15 06/20/2013 1052   CREATININE 0.66 04/08/2015 0500   CREATININE 0.8 06/20/2013 1052   CALCIUM 9.4 04/08/2015 0500   CALCIUM 9.5 06/20/2013 1052   PROT 6.6 04/07/2015 1356   PROT 7.7 06/20/2013 1052   ALBUMIN 3.8 04/07/2015 1356   ALBUMIN 4.0 06/20/2013 1052   AST 18 04/07/2015 1356   AST 31 06/20/2013 1052   ALT 8* 04/07/2015 1356   ALT 22 06/20/2013 1052   ALKPHOS 94 04/07/2015 1356   ALKPHOS 104* 06/20/2013 1052   BILITOT 0.1* 04/07/2015 1356   BILITOT 0.60 06/20/2013 1052   GFRNONAA >60 04/08/2015 0500   GFRAA >60 04/08/2015 0500       Problem List:  Patient Active Problem List   Diagnosis Date Noted  . Stroke ([place])   . Conversion disorder   . Rectal carcinoma ([place])   . Acute CVA (cerebrovascular accident) ([place]) 04/08/2015  . Acute encephalopathy 04/07/2015  . Hypertension 04/07/2015  . Malignant gastrointestinal stromal tumor ([place])   . Fall   . Malignant GIST ([place])  05/30/2012  . Obesity   . Anemia   . GERD (gastroesophageal reflux disease)   . Anemia, iron deficiency   . Anasarca   . GI bleeding   . Gastritis without bleeding 04/03/2012  . Iron deficiency anemia due to chronic blood loss 04/01/2012  . GI bleed 04/01/2012  . Bilateral lower extremity edema 04/01/2012  . Hemorrhoids 04/01/2012  . Nonspecific abnormal electrocardiogram (ECG) (EKG)/unspecified ventricular conduction delay 04/01/2012  . Fecal occult blood test positive 04/01/2012  . Acute renal failure ([place]) 04/01/2012     Palliative Care Assessment & Plan    1.Code  Status:  DNR    Code Status Orders        Start     Ordered   04/07/15 2012  Do not attempt resuscitation (DNR)   Continuous    Question Answer Comment  In the event of cardiac or respiratory ARREST Do not call a "code blue"   In the event of cardiac or respiratory ARREST Do not perform Intubation, CPR, defibrillation or ACLS   In the event of cardiac or respiratory ARREST Use medication by any route, position, wound care, and other measures to relive pain and suffering. May use oxygen, suction and manual treatment of airway obstruction as needed for comfort.      04/07/15 2011    Code Status History    Date Active Date Inactive Code Status Order ID Comments User Context   04/07/2015  6:32 PM 04/07/2015  8:11 PM DNR [id number]  Waldemar Dickens, MD ED   04/01/2012  2:27 AM 04/06/2012  2:30 PM Full Code [id number]  Tvedt Londell Moh, RN Inpatient    Advance Directive Documentation        Most Recent Value   Type of Advance Directive  -- [DNR]   Pre-existing out of facility DNR order (yellow form or pink MOST form)     "MOST" Form in Place?         2. Goals of Care/Additional Recommendations:  Met with family and had long discussion regarding pathways moving forward.  They are having a difficult time with the fact that she is not able to participate in the discussion.  Additionally, there is a great deal of frustration as the family wishes that she would have been willing to continue with therapy for her cancer.  At the same time, they are in agreement that this is not her desire.  We briefly touched on code status.  Her husband states that he disagrees with her decision to be DNR, however he agrees that this was her stated wish.   Her husband is really struggling with trying to make decisions moving forward.  His children are very supportive, but he stated, "I am the man of the house.  This is my decision."  We talked about the goal is to determine what his wife would want moving forward  versus what the family thinks she should want.  There is agreement that she does not want further therapy for her cancer.  She seemed to nod in agreement with this.  As the patient has not sought medical care, has told her family that she wants to be home, and has been reluctant to go for medical care other than when her pain is uncontrolled, we discussed hospice as a good tool when the goal is to feel as well as possible for as long as possible while staying at home.  Home is the only dispo option that the family will consider. "  She will not be going to any kind of nursing center." Not even short term for rehab.  We talked about options for home including home with home health versus home with hospice.  The family is interested in talking more with hospice.  They would like for her to get a couple weeks of physical therapy on discharge home through hospice.  We discussed that they have free choice to choose hospice agency and that PT is not something that is covered by hospice regularly.    Family only wants to meet with hospice agency that will pay for home rehab for a few weeks, then remain home with hospice support. [facility] will often cover this for a short term period.  At this time, family does not seem to want further workup of stroke unless it will drastically change any interventions that would be offered.  I would recommend meeting with hospice liaison to occur prior to additional testing.  Limitations on Scope of Treatment:   Psycho-social Needs: Education on Hospice  3. Symptom Management:      1. Nonverbal.  Does not endorse symptoms.  4. Palliative Prophylaxis:   Aspiration and Delirium Protocol  5. Prognosis: Less that 6 months  6. Discharge Planning:  To be determined.  Home with home health vs home with hospice.   Care plan was discussed with patient and her family  Thank you for allowing the Palliative Medicine Team to assist in the care of this  patient.   Time In: 1730 Time Out: 1845 Total Time 75 Prolonged Time Billed yes        Micheline Rough, MD  04/09/2015, 10:46 PM  Please contact Palliative Medicine Team phone at [phone] for questions and concerns.

## 2015-04-09 NOTE — Progress Notes (Signed)
  Echocardiogram 2D Echocardiogram has been performed.  Shannon Terry 04/09/2015, 3:04 PM

## 2015-04-09 NOTE — Progress Notes (Addendum)
STROKE TEAM PROGRESS NOTE   HISTORY OF PRESENT ILLNESS Shannon Terry is a 75 y.o. female with a history of rectal cancer not currently on treatment who presents not talking. Per family, she has been "weaker" over the past couple of days. She also has had a mechanical fall yesterday. Today, she has not been speaking. When being evaluated by the nurse practitioner with the medical service, she asked her to write and the patient did right. When asked why she was not speaking, the patient wrote "I can talk I just don't have anything to say." On Dr. Cecil Cobbs exam, the patient does not speak, but when asked questions she does write responses including "cold hands" when asked if she feels light touch, "pain pill" when asked if she is in the pain. When asked if she is feeling down, she looks and stops writing. She was LKW 04/06/2015. Patient was not administered TPA secondary to delayin arrival. She was admitted for further evaluation and treatment.   SUBJECTIVE (INTERVAL HISTORY) Her daughter is at the bedside. As per daughter, she walked with PT over the hallway from one end to the other. As per daughter, pt can say words intermittently.   During my rounds, pt initially not cooperative. When walking into the room, her eyes open and tracking, but then closed her eyes not responding to questions. After I talked with daughter, she opened her eyes and answered all orientation questions, named objects but did not repeat sentences. She also asked questions when I discussed with her about possibilities of work up with MRI MRA. At the end of the round, she back to mutism and not answer questions.   OBJECTIVE Temp:  [98 F (36.7 C)-98.7 F (37.1 C)] 98.2 F (36.8 C) (02/03 0400) Pulse Rate:  [79-90] 79 (02/03 0400) Cardiac Rhythm:  [-] Normal sinus rhythm (02/03 0800) Resp:  [16-20] 16 (02/03 0400) BP: (122-192)/(66-89) 162/82 mmHg (02/03 0400) SpO2:  [100 %] 100 % (02/03 0400)  CBC:  Recent Labs Lab  04/07/15 1356 04/07/15 1404 04/08/15 0500  WBC 7.7  --  6.7  NEUTROABS 6.6  --   --   HGB 9.4* 11.6* 9.0*  HCT 30.1* 34.0* 28.8*  MCV 66.9*  --  66.5*  PLT 309  --  0000000    Basic Metabolic Panel:  Recent Labs Lab 04/07/15 1356 04/07/15 1404 04/08/15 0500  NA 143 144 145  K 3.5 3.4* 3.4*  CL 110 108 108  CO2 25  --  25  GLUCOSE 130* 124* 109*  BUN 12 14 11   CREATININE 0.65 0.60 0.66  CALCIUM 9.6  --  9.4    Lipid Panel:    Component Value Date/Time   CHOL 150 04/09/2015 0530   TRIG 94 04/09/2015 0530   HDL 46 04/09/2015 0530   CHOLHDL 3.3 04/09/2015 0530   VLDL 19 04/09/2015 0530   LDLCALC 85 04/09/2015 0530   HgbA1c:  Lab Results  Component Value Date   HGBA1C 5.4 04/08/2015   Urine Drug Screen:    Component Value Date/Time   LABOPIA NONE DETECTED 04/07/2015 1440   COCAINSCRNUR NONE DETECTED 04/07/2015 1440   LABBENZ NONE DETECTED 04/07/2015 1440   AMPHETMU NONE DETECTED 04/07/2015 1440   THCU NONE DETECTED 04/07/2015 1440   LABBARB NONE DETECTED 04/07/2015 1440      IMAGING I have personally reviewed the radiological images below and agree with the radiology interpretations.  Ct Head Wo Contrast 04/07/2015  Normal head CT.   Mr Jeri Cos  Wo Contrast 04/08/2015  Acute moderate RIGHT anterior cerebral artery territory infarct. Involutional changes. Mild to moderate chronic small vessel ischemic disease. RIGHT cerebellar folia atrophy could represent prior infarct, possible sequelae of prior treatment or old trauma.   Dg Chest Portable 1 View 04/07/2015  No acute cardiopulmonary process. Interval decrease in the size of cardiac silhouette.   EEG  04/08/2015- normal EEG is recorded in the waking and sleep state. There was no seizure or seizure predisposition recorded on this study. Please note that a normal EEG does not preclude the possibility of epilepsy.    Physical exam  Temp:  [98 F (36.7 C)-98.7 F (37.1 C)] 98.4 F (36.9 C) (02/03 1000) Pulse Rate:   [79-90] 82 (02/03 1000) Resp:  [16-20] 18 (02/03 1000) BP: (122-192)/(66-105) 192/105 mmHg (02/03 1000) SpO2:  [100 %] 100 % (02/03 1000)  General - Well nourished, well developed, in no apparent distress.  Ophthalmologic - Fundi not visualized due to incooperation.  Cardiovascular - Regular rate and rhythm.  Mental Status -  Awake alert and orientation to time, place, daughter and president were intact. Able to answer orientation questions, but did not answer age. Able to have intermittent language outpt with words and sentence, intact naming, but did not repeat on command, intermittently follows simple commands. Did not test writing, but was told able to write without problem. Right handed.    Cranial Nerves II - XII - II - blinking to visual threat bilaterally. III, IV, VI - moving bilateral horizontal directions. V - Facial sensation not cooperative on exam. VII - Facial symmetrical bilaterally. VIII - Hearing & vestibular not cooperative on exam. X - Palate elevation not cooperative on exam.. XI - Chin turning & shoulder shrug not cooperative on exam.. XII - Tongue protrusion not cooperative on exam..  Motor Strength - The patient's strength was symmetrical in all extremities and pronator drift was absent.  Bulk was normal and fasciculations were absent.   Motor Tone - Muscle tone was assessed at the neck and appendages and was normal.  Reflexes - The patient's reflexes were 1+ in all extremities and she had no pathological reflexes.  Sensory - not cooperative on exam..    Coordination - not cooperative on exam.  Tremor was absent.  Gait and Station - not tested.   ASSESSMENT/PLAN Ms. Shannon Terry is a 75 y.o. female with history of rectal cancer who stopped all treatment presenting with not talking. She did not receive IV t-PA due to delay in arrival.   Conversion disorder  Psychogenic mutism  Likely due to depression, stress with other medical condition especially  worsening rectal carcinoma  Speech therapy  Out pt psychology and psychiatry  Stroke:  Right ACA infarct, likely embolic secondary to unknown source. Hypercoagulable state due to advance malignancy vs. cardioembolic - could be the cause of pt falling and weakness 2 days prior to presentation.   Not able to correlate with mutism in that stroke location  MRI  Right ACA infarct  Further work up including MRA, carotid doppler, LE and UE venous doppler depending on palliative care discussion with family and pt  2D Echo  pending  EEG normal  LDL 86  HgbA1c 5.4  lovenox for VTE prophylaxis  Diet Carb Modified Fluid consistency:: Thin; Room service appropriate?: Yes  No antithrombotic prior to admission, now on aspirin 325 mg daily  Patient counseled to be compliant with her antithrombotic medications  Ongoing aggressive stroke risk factor management  Therapy recommendations:  HH OT, PT and ST  Disposition:  pending  Hypertension  Unstable, on the high side  Permissive hypertension (OK if < 220/120) but gradually normalize in 5-7 days  Hyperlipidemia  Home meds:  none  LDL 86, goal < 70  Statin recommendation depending on palliative care discussion with family and pt  Other Stroke Risk Factors  Advanced age  Other Active Problems  Hypokalemia  Malignant GIST - rectal stromal tumor, followed by [facility] in past but has stopped all tx - palliative care consulted. For full family meeting today  Hospital day # 1  Rosalin Hawking, MD PhD Stroke Neurology 04/09/2015 5:10 PM   To contact Stroke Continuity provider, please refer to [email or web address] After hours, contact General Neurology

## 2015-04-09 NOTE — Progress Notes (Signed)
[facility] PROGRESS NOTE  Shannon Terry [id number] DOB: 01-03-1941 DOA: 04/07/2015 PCP: No PCP Per Patient  Assessment/Plan: Principal Problem:   Acute encephalopathy: Stroke - secondary to acute stroke - Neurology on board  - echo pending  Hypokalemia - Will replace and reassess  Active Problems:   Malignant GIST ([place]) - Palliative care consulted - discontinue treatment in 2015 per notes    Hypertension - Patient has had elevated blood pressures. Will continue to monitor. Will avoid bringing down blood pressure unless patient's blood pressures is very elevated.  Hyperglycemia - obtained hgb a1c - will place on diabetic diet  Code Status: full Family Communication: discussed with daughter and spouse Disposition Plan: pending further work up and recommendations.   Consultants:  Neurology  Procedures:  pending  Antibiotics:  None  HPI/Subjective: Pt has no new complaints.  Objective: Filed Vitals:   04/09/15 0400 04/09/15 1000  BP: 162/82 192/105  Pulse: 79 82  Temp: 98.2 F (36.8 C) 98.4 F (36.9 C)  Resp: 16 18    Intake/Output Summary (Last 24 hours) at 04/09/15 1536 Last data filed at 04/09/15 1125  Gross per 24 hour  Intake    420 ml  Output      0 ml  Net    420 ml   Filed Weights   04/07/15 2251  Weight: 66.271 kg (146 lb 1.6 oz)    Exam:   General:  Patient in no acute distress  Cardiovascular: S1 and S2 within normal limits, no rubs  Respiratory: No increased work of breathing, equal chest rise  Abdomen: Soft, nondistended, nontender  Musculoskeletal: No cyanosis on limited exam  Data Reviewed: Basic Metabolic Panel:  Recent Labs Lab 04/07/15 1356 04/07/15 1404 Apr 18, 2015 0500  NA 143 144 145  K 3.5 3.4* 3.4*  CL 110 108 108  CO2 25  --  25  GLUCOSE 130* 124* 109*  BUN 12 14 11   CREATININE 0.65 0.60 0.66  CALCIUM 9.6  --  9.4   Liver Function Tests:  Recent Labs Lab 04/07/15 1356  AST 18  ALT 8*   ALKPHOS 94  BILITOT 0.1*  PROT 6.6  ALBUMIN 3.8   No results for input(s): LIPASE, AMYLASE in the last 168 hours. No results for input(s): AMMONIA in the last 168 hours. CBC:  Recent Labs Lab 04/07/15 1356 04/07/15 1404 04/18/2015 0500  WBC 7.7  --  6.7  NEUTROABS 6.6  --   --   HGB 9.4* 11.6* 9.0*  HCT 30.1* 34.0* 28.8*  MCV 66.9*  --  66.5*  PLT 309  --  384   Cardiac Enzymes: No results for input(s): CKTOTAL, CKMB, CKMBINDEX, TROPONINI in the last 168 hours. BNP (last 3 results) No results for input(s): BNP in the last 8760 hours.  ProBNP (last 3 results) No results for input(s): PROBNP in the last 8760 hours.  CBG: No results for input(s): GLUCAP in the last 168 hours.  No results found for this or any previous visit (from the past 240 hour(s)).   Studies: Mr Kizzie Fantasia Contrast  18-Apr-2015  CLINICAL DATA:  Weaker for a few days, mechanical fall yesterday. Decreased speaking. Evaluate acute encephalopathy. History of rectal cancer. EXAM: MRI HEAD WITHOUT AND WITH CONTRAST TECHNIQUE: Multiplanar, multiecho pulse sequences of the brain and surrounding structures were obtained without and with intravenous contrast. CONTRAST:  49mL MULTIHANCE GADOBENATE DIMEGLUMINE 529 MG/ML IV SOLN COMPARISON:  CT head April 07, 2015 FINDINGS: Patchy reduced diffusion RIGHT mesial frontal lobe  extending to the periventricular white matter, rostrum of the corpus callosum. Corresponding low ADC values and mild FLAIR T2 hyperintense signal. The ventricles and sulci are normal for patient's age. No abnormal parenchymal signal, mass lesions, mass effect. Patchy supratentorial white matter FLAIR T2 hyperintensities. Mild RIGHT cerebellar peripheral atrophy. No abnormal parenchymal enhancement. No susceptibility artifact to suggest hemorrhage. No abnormal extra-axial fluid collections. No extra-axial masses nor leptomeningeal enhancement. Normal major intracranial vascular flow voids seen at the skull  base. Ocular globes and orbital contents are unremarkable though not tailored for evaluation. No suspicious calvarial bone marrow signal. No abnormal sellar expansion. Craniocervical junction maintained. LEFT maxillary mucosal retention cyst. The mastoid air cells are well aerated. IMPRESSION: Acute moderate RIGHT anterior cerebral artery territory infarct. Involutional changes. Mild to moderate chronic small vessel ischemic disease. RIGHT cerebellar folia atrophy could represent prior infarct, possible sequelae of prior treatment or old trauma. Electronically Signed   By: Elon Alas M.D.   On: 04/08/2015 02:20    Scheduled Meds: . aspirin EC  325 mg Oral Daily  . enoxaparin (LOVENOX) injection  40 mg Subcutaneous Q24H  . potassium chloride  20 mEq Oral Once  . sodium chloride flush  3 mL Intravenous Q12H   Continuous Infusions: . sodium chloride 75 mL/hr at 04/08/15 2146    Time spent: > 35 minutes    Velvet Bathe  [facility] Pager [phone] If 7PM-7AM, please contact night-coverage at [email or web address], password [facility] 04/09/2015, 3:36 PM  LOS: 1 day

## 2015-04-09 NOTE — Evaluation (Addendum)
Speech Language Pathology Evaluation Patient Details Name: Shannon Terry MRN: [id number] DOB: [birth year] Today's Date: 04/09/2015 Time: 1110-1140 SLP Time Calculation (min) (ACUTE ONLY): 30 min  Problem List:  Patient Active Problem List   Diagnosis Date Noted  . Acute CVA (cerebrovascular accident) ([place]) 04/08/2015  . Acute encephalopathy 04/07/2015  . Hypertension 04/07/2015  . Malignant gastrointestinal stromal tumor ([place])   . Fall   . Malignant GIST ([place]) 05/30/2012  . Obesity   . Anemia   . GERD (gastroesophageal reflux disease)   . Anemia, iron deficiency   . Anasarca   . GI bleeding   . Gastritis without bleeding 04/03/2012  . Iron deficiency anemia due to chronic blood loss 04/01/2012  . GI bleed 04/01/2012  . Bilateral lower extremity edema 04/01/2012  . Hemorrhoids 04/01/2012  . Nonspecific abnormal electrocardiogram (ECG) (EKG)/unspecified ventricular conduction delay 04/01/2012  . Fecal occult blood test positive 04/01/2012  . Acute renal failure ([place]) 04/01/2012   Past Medical History:  Past Medical History  Diagnosis Date  . Obesity     BMI 42 Jan 2014.   . Iron deficiency anemia due to chronic blood loss 04/05/12  . GERD (gastroesophageal reflux disease)   . Rectal cancer ([place]) 04/03/12    gastrointestinal stromal tumor  . Anemia, iron deficiency     chronic blood loss  . GI bleeding     hx of  . Hemorrhoids   . Acute renal failure ([place])   . Gastritis without bleeding   . Anasarca   . Hematochezia     Prior to Diagniosis  . History of blood transfusion 04/01/12    Hgb. 2.9/ 6 Units of PRBC  . Malignant GIST ([place]) 05/30/2012   Past Surgical History:  Past Surgical History  Procedure Laterality Date  . Abdominal hysterectomy      Partial  . Esophagogastroduodenoscopy  04/03/2012    Procedure: ESOPHAGOGASTRODUODENOSCOPY (EGD);  Surgeon: Jerene Bears, MD;  Location: [facility];  Service: Gastroenterology;  Laterality: N/A;  . Colonoscopy  04/03/2012   Procedure: COLONOSCOPY;  Surgeon: Jerene Bears, MD;  Location: [facility];  Service: Gastroenterology;  Laterality: N/A;  . Eus  04/08/2012    Procedure: LOWER ENDOSCOPIC ULTRASOUND (EUS);  Surgeon: Milus Banister, MD;  Location: [facility];  Service: Endoscopy;  Laterality: N/A;  . Biopsy stomach  04/03/12    neg for malignancy  . Rectal biopsy  04/03/12    gastrointestinal stromal tumor   HPI:  75 y.o. female with h/o rectal cancer currently refusing treatment, HTN, GERD, GI bleed who presented to ED with AMS after fall. Pt nonverbal since fall, very little PO intake. MRI Brain 2/1 acute moderate R ACA infarct, R cerebellar folia atrophy could represent prior infarct. CXR 2/1 no acute cardiopulmonary process. No previous SLP intervention found on record.  Pt is being followed by Palliative Medicine   Assessment / Plan / Recommendation Clinical Impression  Pt presents with intermittent mutism.  There is decreased spontaneity of communicative output.  Brandt of interaction are unremarkable in that speech is fluent, with appropriate prosody, intact semantic/syntactic structure, clear articulation, normal length-of-utterance.  Written output mirrors verbal output in its accuracy.  Pt will transition into periods of time when speech output, as well as other motor output, diminishes.  Her daughter describes the behavior as "shutting down."  It was notable that during PO intake, she ceased active chewing and cracker remained partially out of mouth without pt demonstrating real awareness - behavior appears to have  qualities of impersistence.   Communication deficits are not fully supported by neuro findings.  SLP will continue to follow for communication, swallowing safety.     SLP Assessment  Patient needs continued Speech Lanaguage Pathology Services    Follow Up Recommendations   Upgrade diet to Dysphagia 3, thin liquids with continued supervision    Frequency and Duration min 2x/week  2 weeks       SLP Evaluation Prior Functioning  Cognitive/Linguistic Baseline: Within functional limits Type of Home: House  Lives With: Spouse Available Help at Discharge: Family;Available 24 hours/day   Cognition  Overall Cognitive Status: Impaired/Different from baseline Arousal/Alertness: Awake/alert Orientation Level: Oriented X4 Attention: Sustained Sustained Attention: Appears intact    Comprehension  Auditory Comprehension Overall Auditory Comprehension: Appears within functional limits for tasks assessed Yes/No Questions: Within Functional Limits Commands: Within Functional Limits Reading Comprehension Reading Status: Not tested    Expression Expression Primary Mode of Expression: Verbal Verbal Expression Overall Verbal Expression: Appears within functional limits for tasks assessed Initiation: Impaired Level of Generative/Spontaneous Verbalization: Sentence Repetition:  (did not repeat) Naming: No impairment Written Expression Dominant Hand: Right Written Expression: Within Functional Limits (writing at sentence with appropriate construction, semantics)   Oral / Motor  Oral Motor/Sensory Function Overall Oral Motor/Sensory Function: Within functional limits Motor Speech Overall Motor Speech: Appears within functional limits for tasks assessed Respiration: Within functional limits Phonation: Normal Resonance: Within functional limits Articulation: Within functional limitis Intelligibility: Intelligible   Arel Tippen L. Tivis Ringer, [place] CCC/SLP Pager [phone]                    Juan Quam Laurice 04/09/2015, 12:28 PM

## 2015-04-10 DIAGNOSIS — I639 Cerebral infarction, unspecified: Secondary | ICD-10-CM

## 2015-04-10 DIAGNOSIS — Z7189 Other specified counseling: Secondary | ICD-10-CM | POA: Insufficient documentation

## 2015-04-10 DIAGNOSIS — Z515 Encounter for palliative care: Secondary | ICD-10-CM | POA: Insufficient documentation

## 2015-04-10 DIAGNOSIS — R4182 Altered mental status, unspecified: Secondary | ICD-10-CM | POA: Insufficient documentation

## 2015-04-10 LAB — BASIC METABOLIC PANEL
ANION GAP: 10 (ref 5–15)
BUN: 11 mg/dL (ref 6–20)
CO2: 23 mmol/L (ref 22–32)
Calcium: 8.8 mg/dL — ABNORMAL LOW (ref 8.9–10.3)
Chloride: 107 mmol/L (ref 101–111)
Creatinine, Ser: 0.71 mg/dL (ref 0.44–1.00)
GFR calc Af Amer: >60 mL/min (ref >60)
Glucose, Bld: 92 mg/dL (ref 65–99)
POTASSIUM: 3.6 mmol/L (ref 3.5–5.1)
SODIUM: 140 mmol/L (ref 135–145)

## 2015-04-10 MED ORDER — CITALOPRAM HYDROBROMIDE 20 MG PO TABS
10.0000 mg | ORAL_TABLET | Freq: Every day | ORAL | Status: DC
  Administered 2015-04-11: 10 mg via ORAL
  Filled 2015-04-10 (×2): qty 1

## 2015-04-10 NOTE — Progress Notes (Signed)
Physical Therapy Treatment Patient Details Name: Shannon Terry MRN: [id number] DOB: January 14, 1941 Today's Date: 04/10/2015    History of Present Illness Shannon Terry is a 75 y.o. female with a history of rectal cancer not currently on treatment who presents with not talking. Per family, she has been "weaker" over the past couple of days. She also has had a mechanical fall yesterday. Today, she has not been speaking. MRI revealed Acute moderate RIGHT anterior cerebral artery territory infarct.    PT Comments    Making good progress towards PT goals. Better stability with gait, tolerating this at a supervision level for safety. Safely completed stair training with min assist and daughter present to learn safe guard and assist techniques. Better ability to follow commands and anticipate needs. Will continue to follow and progress until d/c.  Follow Up Recommendations  Supervision/Assistance - 24 hour;Home health PT     Equipment Recommendations   (family reports they have a RW)    Recommendations for Other Services       Precautions / Restrictions Precautions Precautions: Fall Restrictions Weight Bearing Restrictions: No    Mobility  Bed Mobility               General bed mobility comments: Pt in recliner  Transfers Overall transfer level: Needs assistance Equipment used: Rolling walker (2 wheeled) Transfers: Sit to/from Stand Sit to Stand: Min guard         General transfer comment: Min guard for safety. VC for hand placement to rise but recalled to reach back to sit.  Ambulation/Gait Ambulation/Gait assistance: Min guard Ambulation Distance (Feet): 260 Feet Assistive device: Rolling walker (2 wheeled) Gait Pattern/deviations: Step-through pattern;Decreased stride length;Drifts right/left Gait velocity: decreased Gait velocity interpretation: Below normal speed for age/gender General Gait Details: Better stability today, min guard for safety with occasional drift. Still  with some left neglect, cues intermittently for awareness. No overt loss of balance noted.   Stairs Stairs: Yes Stairs assistance: Min assist Stair Management: No rails;Step to pattern;Forwards (Hand held support) Number of Stairs: 3 General stair comments: VC for sequencing, min assist for handheld support. Minor instability but able to correct with PT assist. Daughter present and verbalizes understanding of guard and assist techniques.  Wheelchair Mobility    Modified Rankin (Stroke Patients Only)       Balance                                    Cognition Arousal/Alertness: Awake/alert Behavior During Therapy: Flat affect Overall Cognitive Status: Impaired/Different from baseline Area of Impairment: Following commands;Awareness       Following Commands: Follows one step commands consistently   Awareness: Emergent   General Comments: able to recall way to room and stairwell.    Exercises      General Comments General comments (skin integrity, edema, etc.): Daughter feels pt doing better today      Pertinent Vitals/Pain Pain Assessment: No/denies pain Pain Intervention(s): Monitored during session    Home Living                      Prior Function            PT Goals (current goals can now be found in the care plan section) Acute Rehab PT Goals PT Goal Formulation: With patient Time For Goal Achievement: 04/22/15 Potential to Achieve Goals: Fair Progress towards PT goals: Progressing toward goals  Frequency  Min 3X/week    PT Plan Current plan remains appropriate    Co-evaluation             End of Session Equipment Utilized During Treatment: Gait belt Activity Tolerance: Patient tolerated treatment well Patient left: in chair;with call bell/phone within reach;with family/visitor present     Time: [id number] PT Time Calculation (min) (ACUTE ONLY): 9 min  Charges:  $Gait Training: 8-22 mins                    G  Codes:      Ellouise Newer Apr 28, 2015, 5:34 PM  Camille Bal [place], [place]

## 2015-04-10 NOTE — Progress Notes (Signed)
STROKE TEAM PROGRESS NOTE   HISTORY AT THE TIME OF ADMISSION Shalonna Wiedel is a 75 y.o. female with a history of rectal cancer not currently on treatment who presents not talking. Per family, she has been "weaker" over the past couple of days. She also has had a mechanical fall yesterday. Today, she has not been speaking. When being evaluated by the nurse practitioner with the medical service, she asked her to write and the patient did write. When asked why she was not speaking, the patient wrote "I can talk I just don't have anything to say." On Dr. Cecil Cobbs exam, the patient does not speak, but when asked questions she does write responses including "cold hands" when asked if she feels light touch, "pain pill" when asked if she is in pain. When asked if she is feeling down, she looks and stops writing. She was LKW 04/06/2015. Patient was not administered TPA secondary to delayin arrival. She was admitted for further evaluation and treatment.   SUBJECTIVE (INTERVAL HISTORY) There was no family at the bedside.  Patient was resting comfortably in bed.  When I entered, she acknowledged me and said "good morning" in response to my greeting.  When I asked if she was in pain, she denied pain.  Most of answers are given with the nodding or shaking of head.  She is somewhat slow to respond but is apparently vocalizing more than she did on initial assessment   OBJECTIVE Temp:  [98.2 F (36.8 C)-99 F (37.2 C)] 98.6 F (37 C) (02/04 0914) Pulse Rate:  [79-91] 80 (02/04 0914) Cardiac Rhythm:  [-] Normal sinus rhythm (02/04 1015) Resp:  [17-18] 18 (02/04 0914) BP: (127-157)/(69-77) 157/77 mmHg (02/04 0914) SpO2:  [100 %] 100 % (02/04 0914)  CBC:   Recent Labs Lab 04/07/15 1356 04/07/15 1404 04/08/15 0500  WBC 7.7  --  6.7  NEUTROABS 6.6  --   --   HGB 9.4* 11.6* 9.0*  HCT 30.1* 34.0* 28.8*  MCV 66.9*  --  66.5*  PLT 309  --  0000000    Basic Metabolic Panel:   Recent Labs Lab 04/08/15 0500  04/09/15 2328  NA 145 140  K 3.4* 3.6  CL 108 107  CO2 25 23  GLUCOSE 109* 92  BUN 11 11  CREATININE 0.66 0.71  CALCIUM 9.4 8.8*    Lipid Panel:     Component Value Date/Time   CHOL 150 04/09/2015 0530   TRIG 94 04/09/2015 0530   HDL 46 04/09/2015 0530   CHOLHDL 3.3 04/09/2015 0530   VLDL 19 04/09/2015 0530   LDLCALC 85 04/09/2015 0530   HgbA1c:  Lab Results  Component Value Date   HGBA1C 5.4 04/08/2015   Urine Drug Screen:     Component Value Date/Time   LABOPIA NONE DETECTED 04/07/2015 1440   COCAINSCRNUR NONE DETECTED 04/07/2015 1440   LABBENZ NONE DETECTED 04/07/2015 1440   AMPHETMU NONE DETECTED 04/07/2015 1440   THCU NONE DETECTED 04/07/2015 1440   LABBARB NONE DETECTED 04/07/2015 1440      IMAGING I have personally reviewed the radiological images below and agree with the radiology interpretations.  Ct Head Wo Contrast 04/07/2015  Normal head CT.   Mr Jeri Cos Wo Contrast 04/08/2015   Acute moderate RIGHT anterior cerebral artery territory infarct. Involutional changes. Mild to moderate chronic small vessel ischemic disease. RIGHT cerebellar folia atrophy could represent prior infarct, possible sequelae of prior treatment or old trauma.   Dg Chest Portable 1 View  04/07/2015  No acute cardiopulmonary process. Interval decrease in the size of cardiac silhouette.   EEG  04/08/2015- normal EEG is recorded in the waking and sleep state. There was no seizure or seizure predisposition recorded on this study. Please note that a normal EEG does not preclude the possibility of epilepsy.    Physical exam  Temp:  [98.2 F (36.8 C)-99 F (37.2 C)] 98.6 F (37 C) (02/04 0914) Pulse Rate:  [79-91] 80 (02/04 0914) Resp:  [17-18] 18 (02/04 0914) BP: (127-157)/(69-77) 157/77 mmHg (02/04 0914) SpO2:  [100 %] 100 % (02/04 0914)  General - Well nourished, well developed, in no apparent distress. HEENT:  NCAT; PERRL; sclera slightly injected; mucous membranes  dry Cardiovascular - Regular rate and rhythm. Resp:  CTA GI:  Abd soft NT, ND normal bowel sounds Extr:  No C/C/E  Mental Status -  Awake alert and orientation to self and place, did not answer inquiry regarding date  Able to have intermittent language outpt with words and sentence, intact naming but slow to respond.  Cranial Nerves II - XII II - PERRL; blinking to visual threat bilaterally. III, IV, VI - moving bilateral horizontal directions. VII - Facial symmetrical bilaterally. VIII - Hearing grossly intact X - Palate elevation not cooperative on exam.. XI - Chin turning & shoulder shrug not cooperative on exam.. XII - Tongue protrusion not cooperative on exam..  Motor Strength - The patient's strength was symmetrical in all extremities and pronator drift was absent.    Sensory - nods her head to indicate symmetry of LT throughout    Coordination - not cooperative on exam.   Gait and Station - not tested.   ASSESSMENT/PLAN Ms. Calli Teele is a 75 y.o. female with history of rectal cancer who stopped all treatment presenting with not talking. She did not receive IV t-PA due to delay in arrival.   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  Patient continues to demonstrate abulia and akinetic features somewhat disproportionate to structural injury  Possibly due to depression, stress with other medical condition especially worsening rectal carcinoma  Speech therapy  Out pt psychology and psychiatry  Stroke:  Right ACA infarct, likely embolic secondary to unknown source. Hypercoagulable state due to advanced malignancy vs. cardioembolic - could be the cause of pt falling and weakness 2 days prior to presentation.   Not able to correlate with mutism in that stroke location  MRI  Right ACA infarct  Further work up including MRA, carotid doppler, LE and UE venous doppler depending on palliative care  discussion with family and pt  2D Echo EF 55-60%. No cardiac source of emboli identified.  EEG normal  LDL 86  HgbA1c 5.4  lovenox for VTE prophylaxis DIET DYS 3 Room service appropriate?: Yes; Fluid consistency:: Thin  No antithrombotic prior to admission, now on aspirin 325 mg daily  Patient counseled to be compliant with her antithrombotic medications  Ongoing aggressive stroke risk factor management  Therapy recommendations:  HH OT, PT and ST  Disposition:  pending  Hypertension  Unstable, on the high side  Permissive hypertension (OK if < 220/120) but gradually normalize in 5-7 days  Hyperlipidemia  Home meds:  none  LDL 86, goal < 70  Statin recommendation depending on palliative care discussion with family and pt  Other Stroke Risk Factors  Advanced age  Other Active Problems  Hypokalemia  Malignant GIST - rectal stromal tumor, followed by Dr.  Ennever in past but has stopped all tx - palliative care consulted. For full family meeting.  Per Palliative Care - At this time, family does not seem to want further workup of stroke unless it will drastically change any interventions that would be offered. I would recommend meeting with hospice liaison to occur prior to additional testing.  Hospital day # 2   Mikey Bussing [facility] Pager [phone] 04/10/2015, 10:41 AM  ATTENDING NOTE: Patient was seen and examined by me personally. Documentation reflects findings. The laboratory and radiographic studies reviewed by me.  ROS completed by me personally and pertinent positives documented with limitations given exam  Condition:  Stable  Assessment and plan completed by me personally and fully documented above. Plans/Recommendations include:     Reviewed Palliative Care Consult note  Recommend continue ASA as long as medically tolerated  Initiated Celexa to address mood  Statin treatment would be indicated if family chooses  to address secondary stroke  prevention goals  At this time, based on Palliative Care note, no further recommendations for additional testing.  Will sign off.  If family should decide on more comprehensive evaluation, please let us know.  However, given clinical scenario, unlikely that further testing will change management recommendations  SIGNED BY: Dr. Elissa Hefty     To contact Stroke Continuity provider, please refer to [email or web address] After hours, contact General Neurology

## 2015-04-10 NOTE — Care Management Note (Signed)
Case Management Note  Patient Details  Name: Shannon Terry MRN: [id number] Date of Birth: Aug 26, 1940  Subjective/Objective:     Patient with  history of rectal cancer not currently on treatment who presents with not talking. Per family, she has been "weaker" over the past couple of days. She also has had a mechanical fall yesterday. Today, she has not been speaking. MRI revealed Acute moderate RIGHT anterior cerebral artery territory infarct.           Action/Plan:   This received call from Dr. Domingo Cocking of [facility] regarding Home hospice services patient and family, agreeable to hospice agency with short term rehab. Referral called in to Elita Boone liaison with  [facility], will come out to meet with family today at 1pm. CM spoke with husband to confirm meeting time with Bambi liaison with [facility] he is agreeable. CM spoke with Dr. Wendee Beavers discharge pending neuro consul, possible discharge tomorrow 04/11/15.    Expected Discharge Date:  04/09/15               Expected Discharge Plan:  Home w Hospice Care  In-House Referral:   Home Hospice  Discharge planning Services  CM Consult  Post Acute Care Choice:    Choice offered to:   Patient and Husband   DME Arranged:    DME Agency:     HH Arranged:    Signe Tackitt Agency:  Other - See comment ([place])  Status of Service:  Completed, signed off  Medicare Important Message Given:  Yes Date Medicare IM Given:    Medicare IM give by:    Date Additional Medicare IM Given:    Additional Medicare Important Message give by:     If discussed at [place] of Stay Meetings, dates discussed:    Additional CommentsLaurena Slimmer, RN 04/10/2015, 10:05 AM

## 2015-04-10 NOTE — Progress Notes (Signed)
[facility] PROGRESS NOTE  Stefenie Vest [id number] DOB: [birth year] DOA: 04/07/2015 PCP: No PCP Per Patient  Assessment/Plan: Principal Problem:   Acute encephalopathy: Stroke - secondary to acute stroke - Neurology on board and recommended palliative care consult. Palliative has informed me that family is interested in Hospice care - Currently recommending aspirin  Hypokalemia - Will replace and reassess  Active Problems:   Malignant GIST ([place]) - Palliative care consulted - discontinue treatment in 2015 per notes    Hypertension - Patient has had elevated blood pressures. Will continue to monitor. Will avoid bringing down blood pressure unless patient's blood pressures is very elevated.  Hyperglycemia - obtained hgb a1c - will place on diabetic diet  Code Status: full Family Communication: discussed with daughter and spouse Disposition Plan: pending further work up and recommendations.   Consultants:  Neurology  Procedures:  pending  Antibiotics:  None  HPI/Subjective: Pt has no new complaints. No acute issues overnight  Objective: Filed Vitals:   04/10/15 0514 04/10/15 0914  BP: 127/75 157/77  Pulse: 79 80  Temp: 98.2 F (36.8 C) 98.6 F (37 C)  Resp: 18 18    Intake/Output Summary (Last 24 hours) at 04/10/15 1703 Last data filed at 04/10/15 0900  Gross per 24 hour  Intake   3060 ml  Output      0 ml  Net   3060 ml   Filed Weights   04/07/15 2251  Weight: 66.271 kg (146 lb 1.6 oz)    Exam:   General:  Patient in no acute distress  Cardiovascular: S1 and S2 within normal limits, no rubs  Respiratory: No increased work of breathing, equal chest rise  Abdomen: Soft, nondistended, nontender  Musculoskeletal: No cyanosis on limited exam  Data Reviewed: Basic Metabolic Panel:  Recent Labs Lab 04/07/15 1356 04/07/15 1404 04/08/15 0500 04/09/15 2328  NA 143 144 145 140  K 3.5 3.4* 3.4* 3.6  CL 110 108 108 107  CO2 25  --   25 23  GLUCOSE 130* 124* 109* 92  BUN 12 14 11 11   CREATININE 0.65 0.60 0.66 0.71  CALCIUM 9.6  --  9.4 8.8*   Liver Function Tests:  Recent Labs Lab 04/07/15 1356  AST 18  ALT 8*  ALKPHOS 94  BILITOT 0.1*  PROT 6.6  ALBUMIN 3.8   No results for input(s): LIPASE, AMYLASE in the last 168 hours. No results for input(s): AMMONIA in the last 168 hours. CBC:  Recent Labs Lab 04/07/15 1356 04/07/15 1404 04/08/15 0500  WBC 7.7  --  6.7  NEUTROABS 6.6  --   --   HGB 9.4* 11.6* 9.0*  HCT 30.1* 34.0* 28.8*  MCV 66.9*  --  66.5*  PLT 309  --  384   Cardiac Enzymes: No results for input(s): CKTOTAL, CKMB, CKMBINDEX, TROPONINI in the last 168 hours. BNP (last 3 results) No results for input(s): BNP in the last 8760 hours.  ProBNP (last 3 results) No results for input(s): PROBNP in the last 8760 hours.  CBG: No results for input(s): GLUCAP in the last 168 hours.  No results found for this or any previous visit (from the past 240 hour(s)).   Studies: No results found.  Scheduled Meds: . aspirin EC  325 mg Oral Daily  . citalopram  10 mg Oral Daily  . enoxaparin (LOVENOX) injection  40 mg Subcutaneous Q24H  . potassium chloride  20 mEq Oral Once  . sodium chloride flush  3 mL Intravenous  Q12H   Continuous Infusions: . sodium chloride 75 mL/hr at 04/10/15 1202    Time spent: > 35 minutes    Velvet Bathe  [facility] Pager [phone] If 7PM-7AM, please contact night-coverage at [email or web address], password [facility] 04/10/2015, 5:03 PM  LOS: 2 days

## 2015-04-10 NOTE — Progress Notes (Signed)
Daily Progress Note   Patient Name: Shannon Terry       Date: 04/10/2015 DOB: 1940-09-01  Age: 75 y.o. MRN#: [id number] Attending Physician: Velvet Bathe, MD Primary Care Physician: No PCP Per Patient Admit Date: 04/07/2015  Reason for Consultation/Follow-up: Establishing goals of care  Subjective:   Met with patient, her husband, 2 daughters, and several other family members.    Met with hospice and plan moving forward is for discharge home with hospice support.  The patient remained minimally involved in discussion but was much more engaged with her eye contact and smiling during encounter today. When I asked if she was wanting to get home as soon as possible she enthusiastically grab my hand while nodding yes.   Length of Stay: 2 days  Current Medications: Scheduled Meds:  . aspirin EC  325 mg Oral Daily  . citalopram  10 mg Oral Daily  . enoxaparin (LOVENOX) injection  40 mg Subcutaneous Q24H  . potassium chloride  20 mEq Oral Once  . sodium chloride flush  3 mL Intravenous Q12H    Continuous Infusions: . sodium chloride 75 mL/hr at 04/10/15 1202    PRN Meds: acetaminophen **OR** acetaminophen, hydrALAZINE, ondansetron **OR** ondansetron (ZOFRAN) IV, traMADol  Physical Exam: Physical Exam     General: Patient in no acute distress, with eyes closed laying in bed most of encounter.  She does intermittently open her eyes and very infrequent nodding with repeated prompting  Cardiovascular: S1 and S2 within normal limits, no rubs  Respiratory: No increased work of breathing, equal chest rise  Abdomen: Soft, nondistended, nontender        Vital Signs: BP 157/77 mmHg  Pulse 80  Temp(Src) 98.6 F (37 C) (Oral)  Resp 18  Wt 66.271 kg (146 lb 1.6 oz)  SpO2 100% SpO2:  SpO2: 100 % O2 Device: O2 Device: Not Delivered O2 Flow Rate:    Intake/output summary:   Intake/Output Summary (Last 24 hours) at 04/10/15 1809 Last data filed at 04/10/15 0900  Gross per 24 hour  Intake   3060 ml  Output      0 ml  Net   3060 ml   LBM: Last BM Date: 04/07/15 Baseline Weight: Weight: 66.271 kg (146 lb 1.6 oz) Most recent weight: Weight: 66.271 kg (146 lb 1.6 oz)  Palliative Assessment/Data: Flowsheet Rows        Most Recent Value   Intake Tab    Referral Department  Hospitalist   Unit at Time of Referral  Med/Surg Unit   Palliative Care Primary Diagnosis  Cancer   Date Notified  04/07/15   Palliative Care Type  New Palliative care   Reason for referral  Clarify Goals of Care   Date of Admission  04/07/15   Date first seen by Palliative Care  04/08/15   # of days Palliative referral response time  1 Day(s)   # of days IP prior to Palliative referral  0   Clinical Assessment    Palliative Performance Scale Score  50%   Pain Max last 24 hours  Not able to report   Pain Min Last 24 hours  Not able to report   Psychosocial & Spiritual Assessment    Palliative Care Outcomes    Patient/Family meeting held?  Yes   Who was at the meeting?  patient, husband, daughter   [place] regarding hospice, Provided advance care planning      Additional Data Reviewed: CBC    Component Value Date/Time   WBC 6.7 04/08/2015 0500   WBC 3.7* 06/19/2014 1314   RBC 4.33 04/08/2015 0500   RBC 5.54* 06/19/2014 1314   RBC 3.94 09/13/2012 1103   HGB 9.0* 04/08/2015 0500   HGB 12.5 06/19/2014 1314   HCT 28.8* 04/08/2015 0500   HCT 39.2 06/19/2014 1314   PLT 384 04/08/2015 0500   PLT 267 06/19/2014 1314   MCV 66.5* 04/08/2015 0500   MCV 71* 06/19/2014 1314   MCH 20.8* 04/08/2015 0500   MCH 22.6* 06/19/2014 1314   MCHC 31.3 04/08/2015 0500   MCHC 31.9* 06/19/2014 1314   RDW 15.3 04/08/2015 0500   RDW 16.4* 06/19/2014 1314   LYMPHSABS  0.6* 04/07/2015 1356   LYMPHSABS 1.4 06/19/2014 1314   MONOABS 0.5 04/07/2015 1356   EOSABS 0.0 04/07/2015 1356   EOSABS 0.2 06/19/2014 1314   BASOSABS 0.0 04/07/2015 1356   BASOSABS 0.0 06/19/2014 1314    CMP     Component Value Date/Time   NA 140 04/09/2015 2328   NA 143 06/20/2013 1052   K 3.6 04/09/2015 2328   K 3.6 06/20/2013 1052   CL 107 04/09/2015 2328   CL 106 06/20/2013 1052   CO2 23 04/09/2015 2328   CO2 31 06/20/2013 1052   GLUCOSE 92 04/09/2015 2328   GLUCOSE 86 06/20/2013 1052   BUN 11 04/09/2015 2328   BUN 15 06/20/2013 1052   CREATININE 0.71 04/09/2015 2328   CREATININE 0.8 06/20/2013 1052   CALCIUM 8.8* 04/09/2015 2328   CALCIUM 9.5 06/20/2013 1052   PROT 6.6 04/07/2015 1356   PROT 7.7 06/20/2013 1052   ALBUMIN 3.8 04/07/2015 1356   ALBUMIN 4.0 06/20/2013 1052   AST 18 04/07/2015 1356   AST 31 06/20/2013 1052   ALT 8* 04/07/2015 1356   ALT 22 06/20/2013 1052   ALKPHOS 94 04/07/2015 1356   ALKPHOS 104* 06/20/2013 1052   BILITOT 0.1* 04/07/2015 1356   BILITOT 0.60 06/20/2013 1052   GFRNONAA >60 04/09/2015 2328   GFRAA >60 04/09/2015 2328       Problem List:  Patient Active Problem List   Diagnosis Date Noted  . Palliative care encounter   . Goals of care, counseling/discussion   . Altered mental status   . Stroke ([place])   . Conversion disorder   .  Rectal carcinoma ([place])   . Acute CVA (cerebrovascular accident) ([place]) 04/08/2015  . Acute encephalopathy 04/07/2015  . Hypertension 04/07/2015  . Malignant gastrointestinal stromal tumor ([place])   . Fall   . Malignant GIST ([place]) 05/30/2012  . Obesity   . Anemia   . GERD (gastroesophageal reflux disease)   . Anemia, iron deficiency   . Anasarca   . GI bleeding   . Gastritis without bleeding 04/03/2012  . Iron deficiency anemia due to chronic blood loss 04/01/2012  . GI bleed 04/01/2012  . Bilateral lower extremity edema 04/01/2012  . Hemorrhoids 04/01/2012  . Nonspecific abnormal  electrocardiogram (ECG) (EKG)/unspecified ventricular conduction delay 04/01/2012  . Fecal occult blood test positive 04/01/2012  . Acute renal failure ([place]) 04/01/2012     Palliative Care Assessment & Plan    1.Code Status:  DNR    Code Status Orders        Start     Ordered   04/07/15 2012  Do not attempt resuscitation (DNR)   Continuous    Question Answer Comment  In the event of cardiac or respiratory ARREST Do not call a "code blue"   In the event of cardiac or respiratory ARREST Do not perform Intubation, CPR, defibrillation or ACLS   In the event of cardiac or respiratory ARREST Use medication by any route, position, wound care, and other measures to relive pain and suffering. May use oxygen, suction and manual treatment of airway obstruction as needed for comfort.      04/07/15 2011    Code Status History    Date Active Date Inactive Code Status Order ID Comments User Context   04/07/2015  6:32 PM 04/07/2015  8:11 PM DNR [id number]  Waldemar Dickens, MD ED   04/01/2012  2:27 AM 04/06/2012  2:30 PM Full Code [id number]  Tvedt Londell Moh, RN Inpatient    Advance Directive Documentation        Most Recent Value   Type of Advance Directive  -- [DNR]   Pre-existing out of facility DNR order (yellow form or pink MOST form)     "MOST" Form in Place?         2. Goals of Care/Additional Recommendations:  I met with patient and her family today.  They  Have met with representative from hospice and plan is for discharge home with hospice support, likely tomorrow.  Limitations on Scope of Treatment:   Psycho-social Needs: Education on Hospice  3. Symptom Management:      1. Nonverbal.  Does not endorse symptoms. I would recommend discharge with a rescue medication kit as she as going home with hospice support. On discharge, would recommend scripts for: - Morphine Concentrate '10mg'$ /0.7m: '5mg'$  (0.27m sublingual every 1 hour as needed for pain or shortness of breath: Disp 3065m  Lorazepam '2mg'$ /ml concentrated solution: '1mg'$  (0.5ml56mublingual every 4 hours as needed for anxiety: Disp 30ml49maldol '2mg'$ /ml solution: 0.'5mg'$  (0.25ml)53mlingual every 4 hours as needed for agitation or nausea: Disp 30ml  14m Palliative Prophylaxis:   Aspiration and Delirium Protocol  5. Prognosis: Less that 6 months  6. Discharge Planning:  Home with Hospice   Care plan was discussed with patient and her family  Thank you for allowing the Palliative Medicine Team to assist in the care of this patient.   Time In: 1730 Time Out: 1745 Total Time 75 Prolonged Time Billed yes        Braeley Buskey FrMicheline Rough/06/2015, 6:09  PM  Please contact Palliative Medicine Team phone at [phone] for questions and concerns.

## 2015-04-11 MED ORDER — LORAZEPAM 2 MG/ML PO CONC: 1.0000 mg | ORAL | Status: DC | PRN

## 2015-04-11 MED ORDER — ASPIRIN 325 MG PO TBEC: 325.0000 mg | DELAYED_RELEASE_TABLET | Freq: Every day | ORAL | Status: DC

## 2015-04-11 MED ORDER — TRAMADOL HCL 50 MG PO TABS: 50.0000 mg | ORAL_TABLET | Freq: Four times a day (QID) | ORAL | Status: DC | PRN

## 2015-04-11 MED ORDER — CITALOPRAM HYDROBROMIDE 10 MG PO TABS: 10.0000 mg | ORAL_TABLET | Freq: Every day | ORAL | Status: DC

## 2015-04-11 MED ORDER — HALOPERIDOL LACTATE 2 MG/ML PO CONC: 0.5000 mg | ORAL | Status: DC | PRN

## 2015-04-11 MED ORDER — MORPHINE SULFATE (CONCENTRATE) 10 MG /0.5 ML PO SOLN: 5.0000 mg | Freq: Two times a day (BID) | ORAL | Status: DC | PRN

## 2015-04-11 NOTE — Discharge Summary (Signed)
Physician Discharge Summary  Shannon Terry JEH:[id number] DOB: 03-03-1941 DOA: 04/07/2015  PCP: No PCP Per Patient  Admit date: 04/07/2015 Discharge date: 04/11/2015  Time spent: > 35 minutes  Recommendations for Outpatient Follow-up:  1. 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[place])   Hypertension   Acute CVA (cerebrovascular accident) ([place])   Stroke ([place])   Conversion disorder   Rectal carcinoma ([place])   Palliative care encounter   Goals of care, counseling/discussion   Altered mental status   Discharge Condition: stable  Diet recommendation: dysphagia 3 diet  Filed Weights   04/07/15 2251 04/10/15 2044  Weight: 66.271 kg (146 lb 1.6 oz) 66.5 kg (146 lb 9.7 oz)    History of present illness:  75 y/o that presented with new CVA  Hospital Course:  CVA - worked up by neurology per their recommendations:   Reviewed Palliative Care Consult note  Recommend continue ASA as long as medically tolerated  Initiated Celexa to address mood  Statin treatment would be indicated if family chooses to address secondary stroke prevention goals  At this time, based on Palliative Care note, no further recommendations for additional testing. Will sign off. If family should decide on more comprehensive evaluation, please let us know. However, given clinical scenario, unlikely that further testing will change management recommendations  Palliative care on board who recommended the following: 1. DNR 2. Goals of Care/Additional Recommendations:  I met with patient and her family today. They Have met with representative from hospice and plan is for discharge home with hospice support, likely tomorrow.  Limitations on Scope of Treatment:   Psycho-social Needs: Education on Hospice  3. Symptom Management:  1. Nonverbal. Does not endorse symptoms. I would recommend discharge with a rescue  medication kit as she as going home with hospice support. On discharge, would recommend scripts for: - Morphine Concentrate 32m/0.5ml: 531m(0.2537msublingual every 1 hour as needed for pain or shortness of breath: Disp 67m60mLorazepam 2mg/55mconcentrated solution: 1mg (73mml) s61mingual every 4 hours as needed for anxiety: Disp 67ml - 38mol 2mg/ml s46mtion: 0.5mg (0.2569m subl43mal every 4 hours as needed for agitation or nausea: Disp 67ml    4. 63miative Prophylaxis:  Aspiration and Delirium Protocol  5. Prognosis: Less that 6 months  6. Discharge Planning:  Home with Hospice  Procedures:  Please refer to imaging results below  Consultations:  Palliative  Neurology  Discharge Exam: Filed Vitals:   04/11/15 0545 04/11/15 0852  BP: 147/70 150/76  Pulse: 80 82  Temp: 97.5 F (36.4 C) 98.7 F (37.1 C)  Resp: 20     General: Pt in nad, alert and awake Cardiovascular: rrr, no mrg Respiratory: no increased wob, no wheezes  Discharge Instructions   Discharge Instructions    Call MD for:  redness, tenderness, or signs of infection (pain, swelling, redness, odor or green/yellow discharge around incision site)    Complete by:  As directed      Call MD for:  severe uncontrolled pain    Complete by:  As directed      Call MD for:  temperature >100.4    Complete by:  As directed      Diet - low sodium heart healthy    Complete by:  As directed      Discharge instructions    Complete by:  As directed   D/c home with home hospice  Increase activity slowly    Complete by:  As directed           Current Discharge Medication List    START taking these medications   Details  aspirin EC 325 MG EC tablet Take 1 tablet (325 mg total) by mouth daily. Qty: 30 tablet, Refills: 0    citalopram (CELEXA) 10 MG tablet Take 1 tablet (10 mg total) by mouth daily. Qty: 30 tablet, Refills: 0    haloperidol (HALDOL) 2 MG/ML solution Take 0.3 mLs (0.6 mg total) by mouth  every 4 (four) hours as needed for agitation. Qty: 15 mL, Refills: 0    LORazepam (ATIVAN) 2 MG/ML concentrated solution Take 0.5 mLs (1 mg total) by mouth every 4 (four) hours as needed for anxiety. Qty: 30 mL, Refills: 0    Morphine Sulfate (MORPHINE CONCENTRATE) 10 mg / 0.5 ml concentrated solution Place 0.25 mLs (5 mg total) under the tongue every 12 (twelve) hours as needed for severe pain or shortness of breath. Qty: 1 Bottle, Refills: 0      CONTINUE these medications which have NOT CHANGED   Details  acetaminophen (TYLENOL) 325 MG tablet Take 650 mg by mouth every 6 (six) hours as needed for mild pain.      STOP taking these medications     oxyCODONE-acetaminophen (PERCOCET/ROXICET) 5-325 MG tablet        No Known Allergies Follow-up Information    Follow up with [facility] On 04/11/2015.   Specialty:  Hospice Services   Why:  Home Hospice will start care as of 04/11/15   Contact information:   [address] [place] [address] [phone]        The results of significant diagnostics from this hospitalization (including imaging, microbiology, ancillary and laboratory) are listed below for reference.    Significant Diagnostic Studies: Ct Head Wo Contrast  04/07/2015  CLINICAL DATA:  Altered mental status, facial droop. EXAM: CT HEAD WITHOUT CONTRAST TECHNIQUE: Contiguous axial images were obtained from the base of the skull through the vertex without intravenous contrast. COMPARISON:  CT scan of June 15, 2014. FINDINGS: Bony calvarium appears intact. No mass effect or midline shift is noted. Ventricular size is within normal limits. There is no evidence of mass lesion, hemorrhage or acute infarction. IMPRESSION: Normal head CT. Electronically Signed   By: Marijo Conception, M.D.   On: 04/07/2015 14:29   Mr Shannon Terry LZ Contrast  04/08/2015  CLINICAL DATA:  Weaker for a few days, mechanical fall yesterday. Decreased speaking. Evaluate acute encephalopathy.  History of rectal cancer. EXAM: MRI HEAD WITHOUT AND WITH CONTRAST TECHNIQUE: Multiplanar, multiecho pulse sequences of the brain and surrounding structures were obtained without and with intravenous contrast. CONTRAST:  32m MULTIHANCE GADOBENATE DIMEGLUMINE 529 MG/ML IV SOLN COMPARISON:  CT head April 07, 2015 FINDINGS: Patchy reduced diffusion RIGHT mesial frontal lobe extending to the periventricular white matter, rostrum of the corpus callosum. Corresponding low ADC values and mild FLAIR T2 hyperintense signal. The ventricles and sulci are normal for patient's age. No abnormal parenchymal signal, mass lesions, mass effect. Patchy supratentorial white matter FLAIR T2 hyperintensities. Mild RIGHT cerebellar peripheral atrophy. No abnormal parenchymal enhancement. No susceptibility artifact to suggest hemorrhage. No abnormal extra-axial fluid collections. No extra-axial masses nor leptomeningeal enhancement. Normal major intracranial vascular flow voids seen at the skull base. Ocular globes and orbital contents are unremarkable though not tailored for evaluation. No suspicious calvarial bone marrow signal. No abnormal sellar expansion.  Craniocervical junction maintained. LEFT maxillary mucosal retention cyst. The mastoid air cells are well aerated. IMPRESSION: Acute moderate RIGHT anterior cerebral artery territory infarct. Involutional changes. Mild to moderate chronic small vessel ischemic disease. RIGHT cerebellar folia atrophy could represent prior infarct, possible sequelae of prior treatment or old trauma. Electronically Signed   By: Elon Alas M.D.   On: 04/08/2015 02:20   Dg Chest Portable 1 View  04/07/2015  CLINICAL DATA:  Altered mental status today. History of rectal cancer and iron deficiency anemia. EXAM: PORTABLE CHEST 1 VIEW COMPARISON:  04/06/2012 portable chest. FINDINGS: 1314 hours. Cardiomegaly has improved compared with the prior study. There is mild vascular congestion, but no  residual edema, confluent airspace opacity or pleural effusion. Moderate thoracic spine degenerative changes are present without acute osseous abnormality. IMPRESSION: No acute cardiopulmonary process. Interval decrease in the size of cardiac silhouette. Electronically Signed   By: Richardean Sale M.D.   On: 04/07/2015 13:24    Microbiology: No results found for this or any previous visit (from the past 240 hour(s)).   Labs: Basic Metabolic Panel:  Recent Labs Lab 04/07/15 1356 04/07/15 1404 04/08/15 0500 04/09/15 2328  NA 143 144 145 140  K 3.5 3.4* 3.4* 3.6  CL 110 108 108 107  CO2 25  --  25 23  GLUCOSE 130* 124* 109* 92  BUN _0 CREATININE 0.65 0.60 0.66 0.71  CALCIUM 9.6  --  9.4 8.8*   Liver Function Tests:  Recent Labs Lab 04/07/15 1356  AST 18  ALT 8*  ALKPHOS 94  BILITOT 0.1*  PROT 6.6  ALBUMIN 3.8   No results for input(s): LIPASE, AMYLASE in the last 168 hours. No results for input(s): AMMONIA in the last 168 hours. CBC:  Recent Labs Lab 04/07/15 1356 04/07/15 1404 04/08/15 0500  WBC 7.7  --  6.7  NEUTROABS 6.6  --   --   HGB 9.4* 11.6* 9.0*  HCT 30.1* 34.0* 28.8*  MCV 66.9*  --  66.5*  PLT 309  --  384   Cardiac Enzymes: No results for input(s): CKTOTAL, CKMB, CKMBINDEX, TROPONINI in the last 168 hours. BNP: BNP (last 3 results) No results for input(s): BNP in the last 8760 hours.  ProBNP (last 3 results) No results for input(s): PROBNP in the last 8760 hours.  CBG: No results for input(s): GLUCAP in the last 168 hours.   Signed:  Velvet Bathe MD.  [facility] 04/11/2015, 2:49 PM

## 2015-04-11 NOTE — Plan of Care (Signed)
Problem: Coping: Goal: Ability to verbalize positive feelings about self will improve Outcome: Not Progressing Pt remains nonverbal. Communicates with hand squeezes and writing.

## 2015-04-11 NOTE — Care Management (Signed)
CM has notified [facility] of discharge, confirmed start of care will be today. D/C summary will be faxed.

## 2015-04-11 NOTE — Progress Notes (Addendum)
CM faxed facesheet along with discharge summary to [facility] agency @ [phone] and confirmation received.

## 2015-04-11 NOTE — Care Management (Signed)
CM spoke with [place] liaison for [facility], Contact [facility] of discharge for Home Hospice admission.  Fax Discharge Summary to [phone] once completed.

## 2015-04-11 NOTE — Care Management Important Message (Signed)
Important Message  Patient Details  Name: Shannon Terry MRN: [id number] Date of Birth: 1940/04/16   Medicare Important Message Given:  Yes    Laurena Slimmer, RN 04/11/2015, 2:16 PM

## 2015-04-12 DIAGNOSIS — I69821 Dysphasia following other cerebrovascular disease: Secondary | ICD-10-CM | POA: Diagnosis not present

## 2015-04-12 DIAGNOSIS — I6350 Cerebral infarction due to unspecified occlusion or stenosis of unspecified cerebral artery: Secondary | ICD-10-CM | POA: Diagnosis not present

## 2015-04-12 DIAGNOSIS — C20 Malignant neoplasm of rectum: Secondary | ICD-10-CM | POA: Diagnosis not present

## 2015-04-12 DIAGNOSIS — I10 Essential (primary) hypertension: Secondary | ICD-10-CM | POA: Diagnosis not present

## 2015-04-12 DIAGNOSIS — G9340 Encephalopathy, unspecified: Secondary | ICD-10-CM | POA: Diagnosis not present

## 2015-04-12 DIAGNOSIS — I69859 Hemiplegia and hemiparesis following other cerebrovascular disease affecting unspecified side: Secondary | ICD-10-CM | POA: Diagnosis not present

## 2015-04-13 DIAGNOSIS — G9340 Encephalopathy, unspecified: Secondary | ICD-10-CM | POA: Diagnosis not present

## 2015-04-13 DIAGNOSIS — I69859 Hemiplegia and hemiparesis following other cerebrovascular disease affecting unspecified side: Secondary | ICD-10-CM | POA: Diagnosis not present

## 2015-04-13 DIAGNOSIS — I10 Essential (primary) hypertension: Secondary | ICD-10-CM | POA: Diagnosis not present

## 2015-04-13 DIAGNOSIS — C20 Malignant neoplasm of rectum: Secondary | ICD-10-CM | POA: Diagnosis not present

## 2015-04-13 DIAGNOSIS — I69821 Dysphasia following other cerebrovascular disease: Secondary | ICD-10-CM | POA: Diagnosis not present

## 2015-04-13 DIAGNOSIS — I6350 Cerebral infarction due to unspecified occlusion or stenosis of unspecified cerebral artery: Secondary | ICD-10-CM | POA: Diagnosis not present

## 2015-04-14 DIAGNOSIS — I69859 Hemiplegia and hemiparesis following other cerebrovascular disease affecting unspecified side: Secondary | ICD-10-CM | POA: Diagnosis not present

## 2015-04-14 DIAGNOSIS — G9340 Encephalopathy, unspecified: Secondary | ICD-10-CM | POA: Diagnosis not present

## 2015-04-14 DIAGNOSIS — C20 Malignant neoplasm of rectum: Secondary | ICD-10-CM | POA: Diagnosis not present

## 2015-04-14 DIAGNOSIS — I6350 Cerebral infarction due to unspecified occlusion or stenosis of unspecified cerebral artery: Secondary | ICD-10-CM | POA: Diagnosis not present

## 2015-04-14 DIAGNOSIS — I10 Essential (primary) hypertension: Secondary | ICD-10-CM | POA: Diagnosis not present

## 2015-04-14 DIAGNOSIS — I69821 Dysphasia following other cerebrovascular disease: Secondary | ICD-10-CM | POA: Diagnosis not present

## 2015-04-16 DIAGNOSIS — G9340 Encephalopathy, unspecified: Secondary | ICD-10-CM | POA: Diagnosis not present

## 2015-04-16 DIAGNOSIS — I10 Essential (primary) hypertension: Secondary | ICD-10-CM | POA: Diagnosis not present

## 2015-04-16 DIAGNOSIS — C20 Malignant neoplasm of rectum: Secondary | ICD-10-CM | POA: Diagnosis not present

## 2015-04-16 DIAGNOSIS — I69821 Dysphasia following other cerebrovascular disease: Secondary | ICD-10-CM | POA: Diagnosis not present

## 2015-04-16 DIAGNOSIS — I6350 Cerebral infarction due to unspecified occlusion or stenosis of unspecified cerebral artery: Secondary | ICD-10-CM | POA: Diagnosis not present

## 2015-04-16 DIAGNOSIS — I69859 Hemiplegia and hemiparesis following other cerebrovascular disease affecting unspecified side: Secondary | ICD-10-CM | POA: Diagnosis not present

## 2015-04-20 DIAGNOSIS — I69859 Hemiplegia and hemiparesis following other cerebrovascular disease affecting unspecified side: Secondary | ICD-10-CM | POA: Diagnosis not present

## 2015-04-20 DIAGNOSIS — I10 Essential (primary) hypertension: Secondary | ICD-10-CM | POA: Diagnosis not present

## 2015-04-20 DIAGNOSIS — C20 Malignant neoplasm of rectum: Secondary | ICD-10-CM | POA: Diagnosis not present

## 2015-04-20 DIAGNOSIS — I6350 Cerebral infarction due to unspecified occlusion or stenosis of unspecified cerebral artery: Secondary | ICD-10-CM | POA: Diagnosis not present

## 2015-04-20 DIAGNOSIS — G9340 Encephalopathy, unspecified: Secondary | ICD-10-CM | POA: Diagnosis not present

## 2015-04-20 DIAGNOSIS — I69821 Dysphasia following other cerebrovascular disease: Secondary | ICD-10-CM | POA: Diagnosis not present

## 2015-04-27 DIAGNOSIS — C20 Malignant neoplasm of rectum: Secondary | ICD-10-CM | POA: Diagnosis not present

## 2015-04-27 DIAGNOSIS — G9340 Encephalopathy, unspecified: Secondary | ICD-10-CM | POA: Diagnosis not present

## 2015-04-27 DIAGNOSIS — I10 Essential (primary) hypertension: Secondary | ICD-10-CM | POA: Diagnosis not present

## 2015-04-27 DIAGNOSIS — I69821 Dysphasia following other cerebrovascular disease: Secondary | ICD-10-CM | POA: Diagnosis not present

## 2015-04-27 DIAGNOSIS — I69859 Hemiplegia and hemiparesis following other cerebrovascular disease affecting unspecified side: Secondary | ICD-10-CM | POA: Diagnosis not present

## 2015-04-27 DIAGNOSIS — I6350 Cerebral infarction due to unspecified occlusion or stenosis of unspecified cerebral artery: Secondary | ICD-10-CM | POA: Diagnosis not present

## 2015-04-30 DIAGNOSIS — I69859 Hemiplegia and hemiparesis following other cerebrovascular disease affecting unspecified side: Secondary | ICD-10-CM | POA: Diagnosis not present

## 2015-04-30 DIAGNOSIS — I10 Essential (primary) hypertension: Secondary | ICD-10-CM | POA: Diagnosis not present

## 2015-04-30 DIAGNOSIS — G9340 Encephalopathy, unspecified: Secondary | ICD-10-CM | POA: Diagnosis not present

## 2015-04-30 DIAGNOSIS — I69821 Dysphasia following other cerebrovascular disease: Secondary | ICD-10-CM | POA: Diagnosis not present

## 2015-04-30 DIAGNOSIS — C20 Malignant neoplasm of rectum: Secondary | ICD-10-CM | POA: Diagnosis not present

## 2015-04-30 DIAGNOSIS — I6350 Cerebral infarction due to unspecified occlusion or stenosis of unspecified cerebral artery: Secondary | ICD-10-CM | POA: Diagnosis not present

## 2015-05-05 DIAGNOSIS — I69859 Hemiplegia and hemiparesis following other cerebrovascular disease affecting unspecified side: Secondary | ICD-10-CM | POA: Diagnosis not present

## 2015-05-05 DIAGNOSIS — I69821 Dysphasia following other cerebrovascular disease: Secondary | ICD-10-CM | POA: Diagnosis not present

## 2015-05-05 DIAGNOSIS — I10 Essential (primary) hypertension: Secondary | ICD-10-CM | POA: Diagnosis not present

## 2015-05-05 DIAGNOSIS — I6350 Cerebral infarction due to unspecified occlusion or stenosis of unspecified cerebral artery: Secondary | ICD-10-CM | POA: Diagnosis not present

## 2015-05-05 DIAGNOSIS — C20 Malignant neoplasm of rectum: Secondary | ICD-10-CM | POA: Diagnosis not present

## 2015-05-05 DIAGNOSIS — G9340 Encephalopathy, unspecified: Secondary | ICD-10-CM | POA: Diagnosis not present

## 2015-11-15 ENCOUNTER — Inpatient Hospital Stay (HOSPITAL_COMMUNITY): Payer: Medicare Other

## 2015-11-15 ENCOUNTER — Encounter (HOSPITAL_COMMUNITY): Payer: Self-pay

## 2015-11-15 ENCOUNTER — Inpatient Hospital Stay (HOSPITAL_COMMUNITY)
Admission: EM | Admit: 2015-11-15 | Discharge: 2015-11-16 | DRG: 065 | Disposition: A | Discharge status: Home / Self Care | Payer: Medicare Other | Attending: Internal Medicine | Admitting: Internal Medicine

## 2015-11-15 DIAGNOSIS — Z66 Do not resuscitate: Secondary | ICD-10-CM | POA: Diagnosis present

## 2015-11-15 DIAGNOSIS — Z8673 Personal history of transient ischemic attack (TIA), and cerebral infarction without residual deficits: Secondary | ICD-10-CM | POA: Diagnosis not present

## 2015-11-15 DIAGNOSIS — K922 Gastrointestinal hemorrhage, unspecified: Secondary | ICD-10-CM | POA: Diagnosis not present

## 2015-11-15 DIAGNOSIS — I69354 Hemiplegia and hemiparesis following cerebral infarction affecting left non-dominant side: Secondary | ICD-10-CM | POA: Diagnosis not present

## 2015-11-15 DIAGNOSIS — D500 Iron deficiency anemia secondary to blood loss (chronic): Secondary | ICD-10-CM | POA: Diagnosis present

## 2015-11-15 DIAGNOSIS — Z6825 Body mass index (BMI) 25.0-25.9, adult: Secondary | ICD-10-CM | POA: Diagnosis not present

## 2015-11-15 DIAGNOSIS — E669 Obesity, unspecified: Secondary | ICD-10-CM | POA: Diagnosis present

## 2015-11-15 DIAGNOSIS — D649 Anemia, unspecified: Secondary | ICD-10-CM | POA: Diagnosis not present

## 2015-11-15 DIAGNOSIS — R202 Paresthesia of skin: Secondary | ICD-10-CM

## 2015-11-15 DIAGNOSIS — Z9114 Patient's other noncompliance with medication regimen: Secondary | ICD-10-CM

## 2015-11-15 DIAGNOSIS — Z85048 Personal history of other malignant neoplasm of rectum, rectosigmoid junction, and anus: Secondary | ICD-10-CM

## 2015-11-15 DIAGNOSIS — C49A5 Gastrointestinal stromal tumor of rectum: Secondary | ICD-10-CM | POA: Diagnosis present

## 2015-11-15 DIAGNOSIS — R29810 Facial weakness: Secondary | ICD-10-CM | POA: Diagnosis not present

## 2015-11-15 DIAGNOSIS — Z79899 Other long term (current) drug therapy: Secondary | ICD-10-CM

## 2015-11-15 DIAGNOSIS — I10 Essential (primary) hypertension: Secondary | ICD-10-CM | POA: Diagnosis present

## 2015-11-15 DIAGNOSIS — Z8249 Family history of ischemic heart disease and other diseases of the circulatory system: Secondary | ICD-10-CM | POA: Diagnosis not present

## 2015-11-15 DIAGNOSIS — K219 Gastro-esophageal reflux disease without esophagitis: Secondary | ICD-10-CM | POA: Diagnosis present

## 2015-11-15 DIAGNOSIS — Z7982 Long term (current) use of aspirin: Secondary | ICD-10-CM | POA: Diagnosis not present

## 2015-11-15 DIAGNOSIS — K921 Melena: Secondary | ICD-10-CM | POA: Diagnosis present

## 2015-11-15 DIAGNOSIS — C20 Malignant neoplasm of rectum: Secondary | ICD-10-CM | POA: Diagnosis not present

## 2015-11-15 DIAGNOSIS — I63521 Cerebral infarction due to unspecified occlusion or stenosis of right anterior cerebral artery: Principal | ICD-10-CM | POA: Diagnosis present

## 2015-11-15 DIAGNOSIS — I639 Cerebral infarction, unspecified: Secondary | ICD-10-CM

## 2015-11-15 DIAGNOSIS — Z833 Family history of diabetes mellitus: Secondary | ICD-10-CM

## 2015-11-15 DIAGNOSIS — R200 Anesthesia of skin: Secondary | ICD-10-CM

## 2015-11-15 DIAGNOSIS — R531 Weakness: Secondary | ICD-10-CM | POA: Diagnosis not present

## 2015-11-15 DIAGNOSIS — R29898 Other symptoms and signs involving the musculoskeletal system: Secondary | ICD-10-CM

## 2015-11-15 DIAGNOSIS — R404 Transient alteration of awareness: Secondary | ICD-10-CM | POA: Diagnosis not present

## 2015-11-15 DIAGNOSIS — D509 Iron deficiency anemia, unspecified: Secondary | ICD-10-CM | POA: Diagnosis present

## 2015-11-15 LAB — CBC
HCT: 21.7 % — ABNORMAL LOW (ref 36.0–46.0)
HEMATOCRIT: 11.9 % — AB (ref 36.0–46.0)
Hemoglobin: 3.2 g/dL — CL (ref 12.0–15.0)
Hemoglobin: 6.3 g/dL — CL (ref 12.0–15.0)
MCH: 15.5 pg — AB (ref 26.0–34.0)
MCH: 19.4 pg — AB (ref 26.0–34.0)
MCHC: 26.9 g/dL — AB (ref 30.0–36.0)
MCHC: 29 g/dL — AB (ref 30.0–36.0)
MCV: 57.8 fL — AB (ref 78.0–100.0)
MCV: 67 fL — ABNORMAL LOW (ref 78.0–100.0)
PLATELETS: 464 10*3/uL — AB (ref 150–400)
Platelets: 409 10*3/uL — ABNORMAL HIGH (ref 150–400)
RBC: 2.06 MIL/uL — ABNORMAL LOW (ref 3.87–5.11)
RBC: 3.24 MIL/uL — ABNORMAL LOW (ref 3.87–5.11)
RDW: 18.5 % — AB (ref 11.5–15.5)
RDW: 26.9 % — AB (ref 11.5–15.5)
WBC: 6.4 10*3/uL (ref 4.0–10.5)
WBC: 10.6 10*3/uL — ABNORMAL HIGH (ref 4.0–10.5)

## 2015-11-15 LAB — URINALYSIS, ROUTINE W REFLEX MICROSCOPIC
BILIRUBIN URINE: NEGATIVE
GLUCOSE, UA: NEGATIVE mg/dL
KETONES UR: NEGATIVE mg/dL
Nitrite: NEGATIVE
PH: 7 (ref 5.0–8.0)
Protein, ur: 100 mg/dL — AB
SPECIFIC GRAVITY, URINE: 1.011 (ref 1.005–1.030)

## 2015-11-15 LAB — BASIC METABOLIC PANEL
Anion gap: 6 (ref 5–15)
BUN: 15 mg/dL (ref 6–20)
CHLORIDE: 115 mmol/L — AB (ref 101–111)
CO2: 23 mmol/L (ref 22–32)
Calcium: 8.3 mg/dL — ABNORMAL LOW (ref 8.9–10.3)
Creatinine, Ser: 1 mg/dL (ref 0.44–1.00)
GFR calc Af Amer: >60 mL/min (ref >60)
GFR calc non Af Amer: 55 mL/min — ABNORMAL LOW (ref >60)
GLUCOSE: 100 mg/dL — AB (ref 65–99)
POTASSIUM: 4 mmol/L (ref 3.5–5.1)
Sodium: 144 mmol/L (ref 135–145)

## 2015-11-15 LAB — PREPARE RBC (CROSSMATCH)

## 2015-11-15 LAB — URINE MICROSCOPIC-ADD ON

## 2015-11-15 MED ORDER — SODIUM CHLORIDE 0.9 % IV SOLN: Freq: Once | INTRAVENOUS | Status: DC

## 2015-11-15 MED ORDER — ACETAMINOPHEN 500 MG PO TABS
500.0000 mg | ORAL_TABLET | Freq: Four times a day (QID) | ORAL | Status: DC | PRN
  Administered 2015-11-15 – 2015-11-16 (×2): 500 mg via ORAL
  Filled 2015-11-15 (×2): qty 1

## 2015-11-15 MED ORDER — SODIUM CHLORIDE 0.9 % IV SOLN
Freq: Once | INTRAVENOUS | Status: AC
  Administered 2015-11-15: 08:00:00 via INTRAVENOUS

## 2015-11-15 NOTE — Progress Notes (Addendum)
Patient arrived on unit from ED.  Blood transfusing in left AC.  Daughter Lelon Frohlich at bedside.  Telemetry placed per MD order and CMT notified.

## 2015-11-15 NOTE — Progress Notes (Signed)
Patient had obvious right facial droop after walking with PT. Speech clear and no arm drift noted. BP 169/81 HR 77.  Dr. Hetty Ely notified.  Will continue to monitor.

## 2015-11-15 NOTE — ED Triage Notes (Signed)
Per EMS pt has had a long extensive hx of colon/rectal CA; pt is not undergoing any treatment for CA; Pt states she has had blood in stools for a very long time but could not say exactly how long; Pt is A&ox 4 on arrival; Pt states no pain but has experienced increased weakness x 2 days and has little to no feeling in her legs; pt states she tried to use walker today to go to the bathroom but was so weak she urinated on her self;

## 2015-11-15 NOTE — ED Notes (Signed)
Family at bedside. 

## 2015-11-15 NOTE — ED Provider Notes (Signed)
[facility] DEPT Provider Note   CSN: [id number] Arrival date & time: 11/15/15  0520  Time Seen 05:38 AM   History   Chief Complaint Chief Complaint  Patient presents with  . Fatigue  . GI Bleeding    HPI Shannon Terry is a 75 y.o. female.  HPI patient is a difficult historian. She states that she has a history of rectal cancer and that she always has "leaking" from her rectum that is bloody. She states that's been going on for years. She states last night her husband had to help her lift her left leg into the bed about 9 PM because she was too weak to put in bed. She also states her leg was numb and she thinks that may have also been around 9 PM. She denies numbness anywhere else including her face or upper extremities. Her husband states her speech has been normal. She states she's been told she had a stroke in the past but she was not aware of it. She states she got up to use the bathroom this morning and she didn't make it to the bathroom before she urinated on her self. She states she just feels weak. She denies pain anywhere.    PCP none  Past Medical History:  Diagnosis Date  . Acute renal failure ([place])   . Anasarca   . Anemia, iron deficiency    chronic blood loss  . Gastritis without bleeding   . GERD (gastroesophageal reflux disease)   . GI bleeding    hx of  . Hematochezia    Prior to Diagniosis  . Hemorrhoids   . History of blood transfusion 04/01/12   Hgb. 2.9/ 6 Units of PRBC  . Iron deficiency anemia due to chronic blood loss 04/05/12  . 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 Obesity    BMI 42 Jan 2014.   Marland Kitchen Rectal cancer ([place]) 04/03/12   gastrointestinal stromal tumor    Patient Active Problem List   Diagnosis Date Noted  . Left leg weakness 11/15/2015  . History of CVA (cerebrovascular accident) 11/15/2015  . Rectal carcinoma ([place])   . Hypertension 04/07/2015  . 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 GERD (gastroesophageal reflux disease)   . GI bleeding     . Iron deficiency anemia due to chronic blood loss 04/01/2012    Past Surgical History:  Procedure Laterality Date  . ABDOMINAL HYSTERECTOMY     Partial  . BIOPSY STOMACH  04/03/12   neg for malignancy  . COLONOSCOPY  04/03/2012   Procedure: COLONOSCOPY;  Surgeon: Jerene Bears, MD;  Location: [facility];  Service: Gastroenterology;  Laterality: N/A;  . ESOPHAGOGASTRODUODENOSCOPY  04/03/2012   Procedure: ESOPHAGOGASTRODUODENOSCOPY (EGD);  Surgeon: Jerene Bears, MD;  Location: [facility];  Service: Gastroenterology;  Laterality: N/A;  . EUS  04/08/2012   Procedure: LOWER ENDOSCOPIC ULTRASOUND (EUS);  Surgeon: Milus Banister, MD;  Location: [facility];  Service: Endoscopy;  Laterality: N/A;  . RECTAL BIOPSY  04/03/12   gastrointestinal stromal tumor    OB History    Obstetric Comments   No HRT       Home Medications    Patient denies taking any medications  Prior to Admission medications   Medication Sig Start Date End Date Taking? Authorizing Provider  acetaminophen (TYLENOL) 325 MG tablet Take 650 mg by mouth every 6 (six) hours as needed for mild pain.    Historical Provider, MD  aspirin EC 325 MG EC tablet Take 1 tablet (  325 mg total) by mouth daily. 04/11/15   Velvet Bathe, MD  citalopram (CELEXA) 10 MG tablet Take 1 tablet (10 mg total) by mouth daily. 04/11/15   Velvet Bathe, MD  haloperidol (HALDOL) 2 MG/ML solution Take 0.3 mLs (0.6 mg total) by mouth every 4 (four) hours as needed for agitation. 04/11/15   Velvet Bathe, MD  LORazepam (ATIVAN) 2 MG/ML concentrated solution Take 0.5 mLs (1 mg total) by mouth every 4 (four) hours as needed for anxiety. 04/11/15   Velvet Bathe, MD  Morphine Sulfate (MORPHINE CONCENTRATE) 10 mg / 0.5 ml concentrated solution Place 0.25 mLs (5 mg total) under the tongue every 12 (twelve) hours as needed for severe pain or shortness of breath. 04/11/15   Velvet Bathe, MD    Family History Family History  Problem Relation Age of Onset  . Diabetes  Mellitus II Father     died from stroke  . Leukemia Brother   . Pancreatic cancer Sister   . Heart attack Brother     Social History Social History  Substance Use Topics  . Smoking status: Never Smoker  . Smokeless tobacco: Never Used     Comment: never used tobacco  . Alcohol use No  retired Lives at home Lives with spouse   Allergies   Review of patient's allergies indicates no known allergies.   Review of Systems Review of Systems  All other systems reviewed and are negative.    Physical Exam Updated Vital Signs BP 154/80   Pulse 80   Temp 98.8 F (37.1 C) (Oral)   Resp 17   Ht 5\' 4"  (1.626 m)   Wt 149 lb (67.6 kg)   SpO2 100%   BMI 25.58 kg/m   Physical Exam  Constitutional: She is oriented to person, place, and time. She appears well-developed and well-nourished.  Non-toxic appearance. She does not appear ill. No distress.  HENT:  Head: Normocephalic and atraumatic.  Right Ear: External ear normal.  Left Ear: External ear normal.  Nose: Nose normal. No mucosal edema or rhinorrhea.  Mouth/Throat: Oropharynx is clear and moist and mucous membranes are normal. No dental abscesses or uvula swelling.  Eyes: EOM are normal. Pupils are equal, round, and reactive to light.  Conjunctiva are very pale  Neck: Normal range of motion and full passive range of motion without pain. Neck supple.  Cardiovascular: Normal rate, regular rhythm and normal heart sounds.  Exam reveals no gallop and no friction rub.   No murmur heard. Pulmonary/Chest: Effort normal and breath sounds normal. No respiratory distress. She has no wheezes. She has no rhonchi. She has no rales. She exhibits no tenderness and no crepitus.  Abdominal: Soft. Normal appearance and bowel sounds are normal. She exhibits no distension. There is no tenderness. There is no rebound and no guarding.  Musculoskeletal: Normal range of motion. She exhibits no edema or tenderness.  Moves all extremities well.     Neurological: She is alert and oriented to person, place, and time. She has normal strength. No cranial nerve deficit.  Patient can lift her lower extremities off the bed however she has difficulty holding them up for more than 5 seconds bilaterally. She has equal grips. She has no facial asymmetry.  Skin: Skin is warm, dry and intact. No rash noted. No erythema. There is pallor.  Patient's palms are very pale  Psychiatric: She has a normal mood and affect. Her speech is normal and behavior is normal. Her mood appears not anxious.  Nursing note and vitals reviewed.    ED Treatments / Results  Labs (all labs ordered are listed, but only abnormal results are displayed) Results for orders placed or performed during the hospital encounter of 82/50/53  Basic metabolic panel  Result Value Ref Range   Sodium 144 135 - 145 mmol/L   Potassium 4.0 3.5 - 5.1 mmol/L   Chloride 115 (H) 101 - 111 mmol/L   CO2 23 22 - 32 mmol/L   Glucose, Bld 100 (H) 65 - 99 mg/dL   BUN 15 6 - 20 mg/dL   Creatinine, Ser 1.00 0.44 - 1.00 mg/dL   Calcium 8.3 (L) 8.9 - 10.3 mg/dL   GFR calc non Af Amer 55 (L) >60 mL/min   GFR calc Af Amer >60 >60 mL/min   Anion gap 6 5 - 15  CBC  Result Value Ref Range   WBC 6.4 4.0 - 10.5 K/uL   RBC 2.06 (L) 3.87 - 5.11 MIL/uL   Hemoglobin 3.2 (LL) 12.0 - 15.0 g/dL   HCT 11.9 (L) 36.0 - 46.0 %   MCV 57.8 (L) 78.0 - 100.0 fL   MCH 15.5 (L) 26.0 - 34.0 pg   MCHC 26.9 (L) 30.0 - 36.0 g/dL   RDW 18.5 (H) 11.5 - 15.5 %   Platelets 409 (H) 150 - 400 K/uL   Laboratory interpretation all normal except severe anemia    EKG  EKG Interpretation  Date/Time:  Monday November 15 2015 05:44:25 EDT Ventricular Rate:  84 PR Interval:    QRS Duration: 135 QT Interval:  436 QTC Calculation: 516 R Axis:   29 Text Interpretation:  Sinus rhythm Atrial premature complex Left bundle branch block Baseline wander in lead(s) V2 Since last tracing rate slower 07 Apr 2015 Confirmed by Reality Dejonge   MD-I, Maddalyn Lutze ([id number]) on 11/15/2015 5:54:09 AM       Radiology No results found.  Procedures Procedures (including critical care time)  Medications Ordered in ED Medications  0.9 %  sodium chloride infusion (not administered)     Initial Impression / Assessment and Plan / ED Course  I have reviewed the triage vital signs and the nursing notes.  Pertinent labs & imaging results that were available during my care of the patient were reviewed by me and considered in my medical decision making (see chart for details).  Clinical Course   By exam patient is probably going to be anemic. Labs were ordered. CT of the head ordered due to the numbness in her left lower extremity.  After reviewing her initial laboratory results patient was typed and screened for 3 units of red blood cells for her very low hemoglobin.  I discussed patient's lab results with patient and her family, she is agreeable to getting a blood transfusion. She is getting ready to go for her head CT. She is not a tPA candidate for treatment of acute stroke due to time of onset and her bleeding.   Pt had 3 units of PRC's ordered, two were ordered to be given.   07:24 AM Dr Genene Churn, [facility], accepts for admission to tele, attending Dr Angelia Mould  Final Clinical Impressions(s) / ED Diagnoses   Final diagnoses:  Lower GI bleed  Symptomatic anemia  Numbness and tingling of left leg   Plan admission   Rolland Porter, MD, Barbette Or, MD 11/15/15 [phone]

## 2015-11-15 NOTE — H&P (Signed)
Date: 11/15/2015               Patient Name:  Shannon Terry MRN: [id number]  DOB: Nov 30, 1940 Age / Sex: 75 y.o., female   PCP: No Pcp Per Patient         Medical Service: Internal Medicine Teaching Service         Attending Physician: Dr. Lucious Groves, DO    First Contact: Dr. Hetty Ely Pager: [phone]  Second Contact: Dr. Hulen Luster Pager: [phone]       After Hours (After 5p/  First Contact Pager: [phone]  weekends / holidays): Second Contact Pager: [phone]   Chief Complaint: leg weakness, chronic GI bleed  History of Present Illness:   75 yo female with hx of rectal cancer (malignant GIST) which has been increasing in size, she is not on therapy due to noncompliance with her meds, has chronic rectal bleeding with clots, hx of stroke 04/2015, HTN,  came in with leg weakness since around 9 PM yesterday. She was sitting in her bed with her legs hanging from the bed when she started feeling leg weakness. It was mainly weak on her left leg along with numbness on left leg, could not walk to the bathroom and had accidentally urinated on herself due to the weakness. Denies any falls or LOC. Denies any vision changes, headache, speech change, or facial droop. Her strenght has improved back to her baseline now. She has been able to walk on her own, uses walker occasionally.   She is not taking any medications for her HTN or any aspirin or statin for her previous stroke. Palliative care was consulted last admission on 04/2015 where wife decided to be DNR and they discussed about home hospice. She received home hospice for few weeks but then they stopped coming to her house. Her daughter is a Therapist, sports at [employer]. She tried calling the hospice agency [facility] several times to figure out why they are not coming and could not reach them. Patient states she does not want hospice anywhere as she feels she can take care of herself with her husband's assistance.   She has chronic rectal bleeding 2/2 to her rectal  GIST, passed blood clots and has small amount of bleeding chronically. Not taking any iron. Has been taking aspirin sometimes along with ibuprofen for mild back pain. Denies any dark stool.  In ED found to have hgb of 3.2. Was ordered to have 3 units of pRBC prepared with 2 units to be transfused by EDP. Vitals stable in ED.  CT head was done for left leg weakness/numbness and it was negative for any acute changes.   Meds:  Current Meds  Medication Sig  . aspirin EC 325 MG EC tablet Take 1 tablet (325 mg total) by mouth daily.  Marland Kitchen ibuprofen (ADVIL,MOTRIN) 200 MG tablet Take 200 mg by mouth 2 (two) times daily.     Allergies: Allergies as of 11/15/2015  . (No Known Allergies)   Past Medical History:  Diagnosis Date  . Acute renal failure ([place])   . Anasarca   . Anemia, iron deficiency    chronic blood loss  . Gastritis without bleeding   . GERD (gastroesophageal reflux disease)   . GI bleeding    hx of  . Hematochezia    Prior to Diagniosis  . Hemorrhoids   . History of blood transfusion 04/01/12   Hgb. 2.9/ 6 Units of PRBC  . Iron deficiency anemia due to chronic  blood loss 04/05/12  . Malignant GIST ([place]) 05/30/2012  . Obesity    BMI 42 Jan 2014.   Marland Kitchen Rectal cancer ([place]) 04/03/12   gastrointestinal stromal tumor    Family History: father - stroke, DM II, HTN. Brother - leukemia, sister - pancreatic cancer.  Social History: lives with husband. Never smoked, no etoh or drug use.   Review of Systems: Review of Systems  Constitutional: Positive for malaise/fatigue and weight loss. Negative for chills and fever.  HENT: Negative for congestion, ear discharge, ear pain, hearing loss and sore throat.   Eyes: Negative for blurred vision, double vision and photophobia.  Respiratory: Negative for cough, hemoptysis, sputum production, shortness of breath and wheezing.   Cardiovascular: Negative for chest pain, palpitations, orthopnea, claudication, leg swelling and PND.    Gastrointestinal: Positive for blood in stool. Negative for abdominal pain, constipation, diarrhea, heartburn, melena, nausea and vomiting.  Genitourinary: Negative for dysuria, frequency, hematuria and urgency.  Skin: Negative for rash.  Neurological: Positive for focal weakness and weakness. Negative for dizziness, tingling, sensory change, speech change and headaches.     Physical Exam: Blood pressure 150/89, pulse 92, temperature 98.3 F (36.8 C), temperature source Oral, resp. rate 14, height 5\' 4"  (1.626 m), weight 149 lb (67.6 kg), SpO2 97 %. Physical Exam  Constitutional: She is oriented to person, place, and time. She appears well-developed and well-nourished. No distress.  HENT:  Head: Normocephalic and atraumatic.  Mouth/Throat: No oropharyngeal exudate.  Eyes: Conjunctivae and EOM are normal. Pupils are equal, round, and reactive to light. Right eye exhibits no discharge. Left eye exhibits no discharge.  Neck: Neck supple. No JVD present.  Cardiovascular: Normal rate and regular rhythm.  Exam reveals no gallop and no friction rub.   No murmur heard. Respiratory: Effort normal and breath sounds normal. No respiratory distress. She has no wheezes. She has no rales.  GI: Soft. Bowel sounds are normal. She exhibits no distension and no mass. There is no tenderness.  Genitourinary:  Genitourinary Comments: No external mass visible on rectal exam. Small mass felt but could not differentiate if it was hemorrhoid or her GIST. No bleeding.   Musculoskeletal: Normal range of motion. She exhibits no edema, tenderness or deformity.  Lymphadenopathy:    She has no cervical adenopathy.  Neurological: She is alert and oriented to person, place, and time. No cranial nerve deficit.  Strength 5/5 upper and lower exts. Sensation intact. Normal finger to nose exam. No facial droop.   Skin: Skin is warm. No rash noted. She is not diaphoretic. No erythema.  Psychiatric: She has a normal mood and  affect.    EKG: NSR, small Qwaves in II, III, and V3. Biphasic T on v5, QTC prolonged 516, LBBB. Similar to prior EKG except TWI were more diffuse previously.   CXR: none  Assessment & Plan by Problem: Principal Problem:   Left leg weakness Active Problems:   Iron deficiency anemia due to chronic blood loss   GERD (gastroesophageal reflux disease)   GI bleeding   History of CVA (cerebrovascular accident)   75 yo female with known rectal cancer (malignant GIST) not on therapy, hx of CVA presents for left leg weakness and symptomatic anemia  Left leg weakness - resolved - concerning for CVA with hx CVA not compliant with asa and statin. Her leg weakness has resolved already. This could be TIA. CT head was negative. Patient has expressed her desire not to take any medication or be subjected to  any intervention. I don't think doing a full stroke workup would be appropriate for her as it would not change her management. Low suspicion for metastasis (GIST usually does not metastasize to spine or brain) and her symptoms would not resolve if she had a lesion.  - will get PT/OT eval. No further workup for now.  Symptomatic anemia - hgb 3.2 with hx of known chronic rectal bleeding from malignant GIST Patient is hemodynamically stable. No dark stool. No abd pain. Likely slow chronic bleeding.  - will give 2x pRBC and check hgb post tx. I suspect she will need more. Will transfuse further to get her hgb >7.0 if needed  HTN - not compliant with meds. -does not want any medication or to see any doctor outpatient.  Malignant GIST - increasing in size based on last CT. Was taking Gleevec in the past but stopped due to side effect (did not explain what it was). Did not want to start any other therapy. Her rectal mass has increased based on CT abd/pelvis 09/2014.  Patient does not want to follow up with Dr. Martha Clan or any other doctor. She does not want any therapy. She expressed he desire to not do  any intervention or therapy for her cancer or any other medical problems. She is DNR.   Dispo: Admit patient to Inpatient with expected length of stay greater than 2 midnights.  Signed: Dellia Nims, MD 11/15/2015, 9:12 AM  Pager: [phone]

## 2015-11-15 NOTE — Evaluation (Signed)
Physical Therapy Evaluation Patient Details Name: Shannon Terry MRN: [id number] DOB: Jul 18, 1940 Today's Date: 11/15/2015   History of Present Illness  75 yo female with known rectal cancer (malignant GIST) not on therapy, hx of CVA presents for left leg weakness and symptomatic anemia  Clinical Impression  Pt admitted with above diagnosis. Pt currently with functional limitations due to the deficits listed below (see PT Problem List). Pt very much wants to go home, and overall did well with mobility;   During session noted marked facial droop, and notified RN, who was speaking with MD as PT session ended; no overt arm drift noted, speech clear, no balance deficits noted; Vitals obtained (see RN note);  Pt will benefit from skilled PT to increase their independence and safety with mobility to allow discharge to the venue listed below.       Follow Up Recommendations Home health PT;Supervision/Assistance - 24 hour    Equipment Recommendations  3in1 (PT)    Recommendations for Other Services       Precautions / Restrictions Precautions Precautions: Fall Precaution Comments: Fall risk present, but minimal      Mobility  Bed Mobility Overal bed mobility: Needs Assistance Bed Mobility: Supine to Sit     Supine to sit: Supervision     General bed mobility comments: supervision for safety; cues to self-monitor for activity tolerance  Transfers Overall transfer level: Needs assistance   Transfers: Sit to/from Stand;Stand Pivot Transfers Sit to Stand: Min guard Stand pivot transfers: Min guard       General transfer comment: [place] for safety; cues to self-monitor for activity tolerance  Ambulation/Gait Ambulation/Gait assistance: Min guard Ambulation Distance (Feet): 100 Feet Assistive device: Rolling walker (2 wheeled) Gait Pattern/deviations: Step-through pattern     General Gait Details: cues to self-monitor for activity tolerance; noted facial droop during amb; RN  notified  Stairs            Wheelchair Mobility    Modified Rankin (Stroke Patients Only)       Balance                                             Pertinent Vitals/Pain Pain Assessment: No/denies pain    Home Living Family/patient expects to be discharged to:: Private residence Living Arrangements: Spouse/significant other Available Help at Discharge: Family;Available 24 hours/day Type of Home: House Home Access: Stairs to enter Entrance Stairs-Rails: None Entrance Stairs-Number of Steps: 2 Home Layout: One level Home Equipment: Walker - 2 wheels      Prior Function Level of Independence: Independent with assistive device(s)         Comments: Uses RW prn on rough days, has been using it more lately     Hand Dominance   Dominant Hand: Right    Extremity/Trunk Assessment   Upper Extremity Assessment: Overall WFL for tasks assessed           Lower Extremity Assessment: Generalized weakness         Communication   Communication: No difficulties  Cognition Arousal/Alertness: Awake/alert Behavior During Therapy: WFL for tasks assessed/performed Overall Cognitive Status: Within Functional Limits for tasks assessed                      General Comments      Exercises        Assessment/Plan  PT Assessment Patient needs continued PT services  PT Diagnosis Difficulty walking;Generalized weakness   PT Problem List Decreased strength;Decreased activity tolerance;Decreased balance;Decreased mobility;Decreased knowledge of use of DME;Decreased knowledge of precautions  PT Treatment Interventions DME instruction;Gait training;Stair training;Functional mobility training;Therapeutic activities;Therapeutic exercise;Patient/family education   PT Goals (Current goals can be found in the Care Plan section) Acute Rehab PT Goals Patient Stated Goal: Hopes to be home soon PT Goal Formulation: With patient Time For Goal  Achievement: 11/29/15 Potential to Achieve Goals: Good    Frequency Min 3X/week   Barriers to discharge        Co-evaluation               End of Session Equipment Utilized During Treatment: Gait belt Activity Tolerance: Patient tolerated treatment well Patient left: in bed;with call bell/phone within reach;Other (comment) (RN assessing, pm labs being drawn) Nurse Communication: Mobility status;Other (comment) (and noted facial droop)         Time: 3094-0768 PT Time Calculation (min) (ACUTE ONLY): 32 min   Charges:   PT Evaluation $PT Eval Moderate Complexity: 1 Procedure PT Treatments $Gait Training: 8-22 mins   PT G Codes:        Quin Hoop 11/15/2015, 4:46 PM  Roney Marion, [place] Pager [phone] Office [phone]

## 2015-11-15 NOTE — Progress Notes (Signed)
Called ED for report.  Arron, RN to return call.

## 2015-11-16 DIAGNOSIS — Z66 Do not resuscitate: Secondary | ICD-10-CM

## 2015-11-16 DIAGNOSIS — Z9119 Patient's noncompliance with other medical treatment and regimen: Secondary | ICD-10-CM

## 2015-11-16 DIAGNOSIS — I10 Essential (primary) hypertension: Secondary | ICD-10-CM

## 2015-11-16 DIAGNOSIS — I69354 Hemiplegia and hemiparesis following cerebral infarction affecting left non-dominant side: Secondary | ICD-10-CM

## 2015-11-16 DIAGNOSIS — C20 Malignant neoplasm of rectum: Secondary | ICD-10-CM

## 2015-11-16 DIAGNOSIS — D500 Iron deficiency anemia secondary to blood loss (chronic): Secondary | ICD-10-CM

## 2015-11-16 DIAGNOSIS — I4581 Long QT syndrome: Secondary | ICD-10-CM

## 2015-11-16 DIAGNOSIS — K922 Gastrointestinal hemorrhage, unspecified: Secondary | ICD-10-CM

## 2015-11-16 LAB — BASIC METABOLIC PANEL
Anion gap: 8 (ref 5–15)
BUN: 9 mg/dL (ref 6–20)
CHLORIDE: 110 mmol/L (ref 101–111)
CO2: 21 mmol/L — ABNORMAL LOW (ref 22–32)
Calcium: 9.1 mg/dL (ref 8.9–10.3)
Creatinine, Ser: 0.84 mg/dL (ref 0.44–1.00)
GFR calc non Af Amer: >60 mL/min (ref >60)
Glucose, Bld: 110 mg/dL — ABNORMAL HIGH (ref 65–99)
POTASSIUM: 3.6 mmol/L (ref 3.5–5.1)
SODIUM: 139 mmol/L (ref 135–145)

## 2015-11-16 LAB — CBC
HCT: 28.4 % — ABNORMAL LOW (ref 36.0–46.0)
HEMOGLOBIN: 9 g/dL — AB (ref 12.0–15.0)
MCH: 21.9 pg — ABNORMAL LOW (ref 26.0–34.0)
MCHC: 31.7 g/dL (ref 30.0–36.0)
MCV: 69.1 fL — ABNORMAL LOW (ref 78.0–100.0)
Platelets: 416 10*3/uL — ABNORMAL HIGH (ref 150–400)
RBC: 4.11 MIL/uL (ref 3.87–5.11)
WBC: 14.7 10*3/uL — AB (ref 4.0–10.5)

## 2015-11-16 MED ORDER — OXYCODONE-ACETAMINOPHEN 5-325 MG PO TABS: 1.0000 | ORAL_TABLET | ORAL | 0 refills | Status: DC | PRN

## 2015-11-16 MED ORDER — POLYETHYLENE GLYCOL 3350 17 G PO PACK: 17.0000 g | PACK | Freq: Every day | ORAL | 0 refills | Status: DC | PRN

## 2015-11-16 MED ORDER — PRO-STAT SUGAR FREE PO LIQD: 30.0000 mL | Freq: Two times a day (BID) | ORAL | Status: DC

## 2015-11-16 MED ORDER — OXYCODONE-ACETAMINOPHEN 5-325 MG PO TABS
1.0000 | ORAL_TABLET | ORAL | Status: DC | PRN
Start: 2015-11-16 — End: 2015-11-16
  Administered 2015-11-16: 2 via ORAL
  Filled 2015-11-16: qty 2

## 2015-11-16 MED ORDER — SODIUM CHLORIDE 0.9 % IV SOLN
510.0000 mg | Freq: Once | INTRAVENOUS | Status: AC
  Administered 2015-11-16: 510 mg via INTRAVENOUS
  Filled 2015-11-16: qty 17

## 2015-11-16 MED ORDER — POLYETHYLENE GLYCOL 3350 17 G PO PACK: 17.0000 g | PACK | Freq: Every day | ORAL | Status: DC | PRN

## 2015-11-16 NOTE — Progress Notes (Signed)
Blood transfusion completed. Patient's BP 192/93. On call MD notified. Awaiting orders. RN will continue to monitor patient.  Eleanora Neighbor, RN

## 2015-11-16 NOTE — Progress Notes (Signed)
OT Cancellation Note  Patient Details Name: Shannon Terry MRN: [id number] DOB: 10-17-1940   Cancelled Treatment:    Reason Eval/Treat Not Completed: Other (comment). 2nd attempt to complete OT evaluation. Pt would not respond to therapist calling her name or asking her questions. Pt's family members indicated that she is exhausted and requested for therapist to try tomorrow. Will attempt tomorrow if schedule allows.  Redmond Baseman, OTR/L Pager: [phone] 11/16/2015, 1:49 PM

## 2015-11-16 NOTE — Progress Notes (Signed)
D/c instructions reviewed with pt and husband, copy of instructions and scripts given to pt. Pt not wanting to treat stroke per her wishes, pt knows stroke education and desires not to have more.  Pt goal is pain control/comfort (pain management) for her rectal/bowel cancer. Pt d/c'd with prescription for pain medication -percocet, pain controlled today with one dose of two tablets.   Pt d/c'd via wheelchair with belongings with husband, escorted by unit NT.

## 2015-11-16 NOTE — Progress Notes (Signed)
Initial Nutrition Assessment  DOCUMENTATION CODES:   Not applicable  INTERVENTION:  Provide Magic cup TID between meals, each supplement provides 290 kcal and 9 grams of protein.  Provide 30 ml Prostat po BID, each supplement provides 100 kcal and 15 grams of protein.   Encourage adequate PO intake.   NUTRITION DIAGNOSIS:   Inadequate oral intake related to poor appetite as evidenced by per patient/family report.  GOAL:   Patient will meet greater than or equal to 90% of their needs  MONITOR:   PO intake, Supplement acceptance, Labs, Weight trends, Skin, I & O's  REASON FOR ASSESSMENT:   Malnutrition Screening Tool    ASSESSMENT:   75 yo female with hx of rectal cancer (malignant GIST) which has been increasing in size, she is not on therapy due to noncompliance with her meds, has chronic rectal bleeding with clots, hx of stroke 04/2015, HTN,  came in with leg weakness.  Pt was asleep during time of visit and did not wake to RD assessment. Family member at bedside. She reports pt with no po intake to due to pain. She reports pt's appetite is very varied depending on her level of pain. She reports pt would eat well on days where she feels good and then on other days will have poor po intake due to pain. Family member reports weight usually fluctuates however mostly stable. RD offered to order nutritional supplements to patient and family member reports pt only likes to consume Ensure pudding. RD to order Magic cup and Prostat between meals to aid in caloric and protein needs.   Unable to complete Nutrition-Focused physical exam at this time. RD to complete at next visit.   Labs and medications reviewed.   Diet Order:  Diet regular Room service appropriate? Yes; Fluid consistency: Thin Diet - low sodium heart healthy  Skin:  Reviewed, no issues  Last BM:  9/12  Height:   Ht Readings from Last 1 Encounters:  11/15/15 5\' 2"  (1.575 m)    Weight:   Wt Readings from  Last 1 Encounters:  11/15/15 163 lb 9.3 oz (74.2 kg)    Ideal Body Weight:  50 kg  BMI:  Body mass index is 29.92 kg/m.  Estimated Nutritional Needs:   Kcal:  1700-1900  Protein:  85-95 grams  Fluid:  1.7 - 1.9 L/day  EDUCATION NEEDS:   No education needs identified at this time  Corrin Parker, MS, RD, LDN Pager # [phone] After hours/ weekend pager # [phone]

## 2015-11-16 NOTE — Discharge Summary (Signed)
Name: Shannon Terry MRN: [id number] DOB: 1940-07-03 75 y.o. PCP: No Pcp Per Patient  Date of Admission: 11/15/2015  5:20 AM Date of Discharge: 11/16/2015 Attending Physician: Dr. Joni Reining   Discharge Diagnosis: Principal Problem:   Left leg weakness Active Problems:   Iron deficiency anemia due to chronic blood loss   GERD (gastroesophageal reflux disease)   GI bleeding   Acute CVA (cerebrovascular accident) ([place])   History of CVA (cerebrovascular accident)   Discharge Medications:   Medication List    STOP taking these medications   aspirin 325 MG EC tablet   ibuprofen 200 MG tablet Commonly known as:  ADVIL,MOTRIN   morphine CONCENTRATE 10 mg / 0.5 ml concentrated solution     TAKE these medications   citalopram 10 MG tablet Commonly known as:  CELEXA Take 1 tablet (10 mg total) by mouth daily.   haloperidol 2 MG/ML solution Commonly known as:  HALDOL Take 0.3 mLs (0.6 mg total) by mouth every 4 (four) hours as needed for agitation.   LORazepam 2 MG/ML concentrated solution Commonly known as:  ATIVAN Take 0.5 mLs (1 mg total) by mouth every 4 (four) hours as needed for anxiety.   oxyCODONE-acetaminophen 5-325 MG tablet Commonly known as:  PERCOCET/ROXICET Take 1-2 tablets by mouth every 4 (four) hours as needed for moderate pain.   polyethylene glycol packet Commonly known as:  MIRALAX / GLYCOLAX Take 17 g by mouth daily as needed for mild constipation.       Disposition and follow-up:   Shannon Terry was discharged from [facility] in Stable condition.  At the hospital follow up visit please address:  1. Acute on chronic CVA: Any new focal weakness, has her previous weakness resolved? If not, will she work with PT? Rectal cancer: Has her pain been controlled? Any symptomatic anemia?  2.  Labs / imaging needed at time of follow-up: CBC   3.  Pending labs/ test needing follow-up: none   Follow-up Appointments: Follow-up  Information    Hospice at [facility] .   Specialty:  Hospice and Palliative Medicine Why:  This is the group of doctors that we had mentioned, feel free to schedule an appointment with them if you want to at any time  Contact information: [place] [address] [phone]           Hospital Course by problem list:  Acute CVA  Shannon Terry is a 75 year old woman with past medical history of rectal cancer and chronic rectal bleeding, stroke and HTN who presented with leg weakness. The weakness had resolved by the time she presented to the hospital however she developed right facial droop a few hours into her admission. MRI showed acute infarcion of the right anterior cerebral artery and chronic right cerebellar infarction. It was advised that she start aspirin and atovastatin however she adamantly declined starting any medications. She stated that her primary goal was acheiving relief of her rectal cancer pain and going home.   Chronic GI Bleed with iron deficiency anemia  On admission CBC Hemoglobin 3.2. She was transfused 3 units of packed red blood cells and 1 dose of ferraheme. We counseled on maintenance iron therapy options but she declined these options. On the day of discharge she remained asymptomatic and with hemoglobin 9.0   Rectal cancer  She was provided counseling on the benefits of palliative medicine and maintaining a relationship of continuity with a physician. She had previously worked with palliative  medicine and home hospice but did not find these interactions beneficial. She and her family were in agreement that she has a stubborn character and would not show to follow up visits with a physician.   Hypertension  She has not been on medications for this and has declined all treatment. Permissive hypertension was maintained in the setting of acute CVA.    Discharge Vitals:   BP (!) 176/91 (BP Location: Left Arm)   Pulse 80   Temp 98.9 F (37.2 C)  (Oral)   Resp 19   Ht 5\' 2"  (1.575 m)   Wt 163 lb 9.3 oz (74.2 kg)   SpO2 100%   BMI 29.92 kg/m   Pertinent Labs, Studies, and Procedures:  Procedures Performed:  Ct Head Wo Contrast  Result Date: 11/15/2015 CLINICAL DATA:  Acute left-sided weakness and numbness. EXAM: CT HEAD WITHOUT CONTRAST TECHNIQUE: Contiguous axial images were obtained from the base of the skull through the vertex without intravenous contrast. COMPARISON:  CT scan of April 07, 2015.  MRI of April 08, 2015. FINDINGS: Bony calvarium appears intact. Old right subcortical frontal infarction is noted. No mass effect or midline shift is noted. Ventricular size is within normal limits. There is no evidence of mass lesion, hemorrhage or acute infarction. IMPRESSION: Old right frontal subcortical white matter infarction. No acute intracranial abnormality seen. Electronically Signed   By: Marijo Conception, M.D.   On: 11/15/2015 08:37   Mr Brain Wo Contrast  Result Date: 11/15/2015 CLINICAL DATA:  75 y/o F; history of rectal cancer with leg numbness. EXAM: MRI HEAD WITHOUT CONTRAST TECHNIQUE: Multiplanar, multiecho pulse sequences of the brain and surrounding structures were obtained without intravenous contrast. COMPARISON:  11/15/2015 CT head.  04/08/2015 MRI brain. FINDINGS: Brain: There are a few punctate foci of diffusion restriction within the right anterior cerebral artery territory in the paramedian right frontal lobe consistent with acute/ early subacute infarction (series 3, image 34). Chronic encephalomalacia of right paramedian frontal lobe from prior anterior cerebral artery infarction and additional small chronic infarct within the right cerebellar hemisphere. Background of mild chronic microvascular ischemic changes and parenchymal volume loss. No abnormal susceptibility to suggest intracranial hemorrhage. Vascular: Small caliber right anterior cerebral artery flow void. Intracranial flow voids are otherwise  unremarkable. Skull and upper cervical spine: Left parietal scalp T1 and T2 hyperintense focus measuring 22 x 12 mm consistent with lipoma. Sinuses/Orbits: Mucous retention cysts within the left maxillary sinus, otherwise no abnormal signal of visualized paranasal sinuses or mastoid air cells. Orbits are unremarkable. Other: None. IMPRESSION: 1. Few punctate foci of acute/early subacute infarction within the right anterior cerebral artery distribution. 2. Underlying chronic right anterior cerebral artery infarction, small chronic right cerebellar infarction, mild chronic microvascular ischemic changes, and parenchymal volume loss. 3. Left parietal scalp lipoma. These results will be called to the ordering clinician or representative by the Radiologist Assistant, and communication documented in the PACS or zVision Dashboard. Electronically Signed   By: Kristine Garbe M.D.   On: 11/15/2015 21:09   Discharge Instructions: Discharge Instructions    Call MD for:  extreme fatigue    Complete by:  As directed    Call MD for:  persistant dizziness or light-headedness    Complete by:  As directed    Call MD for:  temperature >100.4    Complete by:  As directed    Diet - low sodium heart healthy    Complete by:  As directed    Increase activity  slowly    Complete by:  As directed       Signed: Ledell Noss, MD 11/16/2015, 9:07 PM   Pager: [phone]   .imts

## 2015-11-16 NOTE — Progress Notes (Signed)
Physical Therapy Treatment Patient Details Name: Renea Schoonmaker MRN: [id number] DOB: [birth year] Today's Date: 11/16/2015    History of Present Illness 75 yo female with known rectal cancer (malignant GIST) not on therapy, hx of CVA presents for left leg weakness and symptomatic anemia    PT Comments    Noting improving activity tolerance, with incr gait distance; Pt declining stair training, she anticipates no difficulty with the steps to enter her home;   Pt and husband are very interested in getting a prescription for pain medication for when she is at home.   Follow Up Recommendations  Home health PT;Supervision/Assistance - 24 hour     Equipment Recommendations  3in1 (PT)    Recommendations for Other Services       Precautions / Restrictions Precautions Precautions: Fall Precaution Comments: Fall risk present, but minimal, and lessened with use of RW    Mobility  Bed Mobility Overal bed mobility: Needs Assistance Bed Mobility: Supine to Sit     Supine to sit: Supervision     General bed mobility comments: supervision for safety; cues to self-monitor for activity tolerance  Transfers Overall transfer level: Needs assistance Equipment used: Rolling walker (2 wheeled) Transfers: Sit to/from Stand Sit to Stand: Min guard         General transfer comment: Minguard for safety; cues to self-monitor for activity tolerance  Ambulation/Gait Ambulation/Gait assistance: Min guard Ambulation Distance (Feet): 180 Feet Assistive device: Rolling walker (2 wheeled) Gait Pattern/deviations: Step-through pattern     General Gait Details: cues to self-monitor for activity tolerance   Stairs Stairs:  (Pt declined)          Wheelchair Mobility    Modified Rankin (Stroke Patients Only)       Balance                                    Cognition Arousal/Alertness: Awake/alert Behavior During Therapy: WFL for tasks assessed/performed Overall  Cognitive Status: Within Functional Limits for tasks assessed                      Exercises      General Comments        Pertinent Vitals/Pain Pain Assessment: No/denies pain    Home Living                      Prior Function            PT Goals (current goals can now be found in the care plan section) Acute Rehab PT Goals Patient Stated Goal: Hopes to be home soon PT Goal Formulation: With patient Time For Goal Achievement: 11/29/15 Potential to Achieve Goals: Good Progress towards PT goals: Progressing toward goals    Frequency  Min 3X/week    PT Plan Current plan remains appropriate    Co-evaluation             End of Session Equipment Utilized During Treatment: Gait belt Activity Tolerance: Patient tolerated treatment well Patient left: in bed;with call bell/phone within reach (sitting EOB)     Time: 5361-4431 PT Time Calculation (min) (ACUTE ONLY): 9 min  Charges:  $Gait Training: 8-22 mins                    G Codes:      Quin Hoop 11/16/2015, 4:14 PM   Roney Marion, PT  Acute Rehabilitation Services Pager [phone] Office [phone]

## 2015-11-16 NOTE — Progress Notes (Signed)
OT Cancellation Note  Patient Details Name: Shannon Terry MRN: [id number] DOB: 1940-09-11   Cancelled Treatment:    Reason Eval/Treat Not Completed: Other (comment). Pt's daughter stated that pt had just fallen asleep after being up all night and requested therapist come back later today. Will attempt to see pt later today if able.  Redmond Baseman, OTR/L Pager: [phone] 11/16/2015, 10:33 AM

## 2015-11-16 NOTE — Discharge Instructions (Signed)
Thank you for trusting [place] with your medical care!  You were hospitalized for chronic gastrointestinal bleeding and stroke and treated with blood transfusion, aspirin, and .   Please take note of the following changes to your medications: Take percocet 1-2 tablets every 4 hours as needed for pain, do not exceed greater than 4000 mg of tylenol per day.  Take Mirilax as needed for constipation We would recommend that you start taking aspirin, atorvastatin, and iron supplementation as well   To make sure you are getting better, please make it to the follow-up appointments listed on the first page.  If you have any questions, please call [phone].

## 2015-11-16 NOTE — Progress Notes (Signed)
RN spoke with on call MD, No new orders given. RN will continue to monitor pt.   Shannon Neighbor, RN

## 2015-11-16 NOTE — Progress Notes (Signed)
Subjective: Shannon Terry had rectal pain when seen on rounds this morning. She was prescribed percocet 5-325 1-2 tablets q4hours and when seen this afternoon she stated her pain was very well controlled. She denies any new feelings of focal weakness.   She expresses that she previously didn't like working with hospice in her home and her daughter feels they may have signed off. We offered her the option to follow up with [facility] and palliative medicine. She expressed her desire not to follow up for primary care and states that her primary goal is pain control. Her daughter and husband are in agreement that Shannon Terry is sometimes stubborn and wont follow up with a physician if she doesn't want to.   Objective:  Vital signs in last 24 hours: Vitals:   11/15/15 2215 11/16/15 0115 11/16/15 0517 11/16/15 0947  BP: (!) 176/80 (!) 192/93 (!) 164/76 (!) 176/91  Pulse: 82 79 83 80  Resp: 18 18 20 19   Temp: 98.7 F (37.1 C) 98.6 F (37 C) 99.6 F (37.6 C) 98.9 F (37.2 C)  TempSrc:    Oral  SpO2: 98% 100% 99% 100%  Weight:      Height:       Physical Exam  Constitutional: She appears well-developed and well-nourished. No distress.  Cardiovascular: Normal rate and regular rhythm.   No murmur heard. Pulmonary/Chest: She has no wheezes. She has no rales.  Abdominal: Soft. She exhibits no distension. There is no tenderness.  Neurological: She is alert.  Right facial droop somewhat improved from yesterday   Extremities no calf tenderness or peripheral edema  Labs: CBC:  Recent Labs Lab 11/15/15 0600 11/15/15 1630 11/16/15 0501  WBC 6.4 10.6* 14.7*  HGB 3.2* 6.3* 9.0*  HCT 11.9* 21.7* 28.4*  MCV 57.8* 67.0* 69.1*  PLT 409* 427* 062*   Metabolic Panel:  Recent Labs Lab 11/15/15 0600 11/16/15 0501  NA 144 139  K 4.0 3.6  CL 115* 110  CO2 23 21*  GLUCOSE 100* 110*  BUN 15 9  CREATININE 1.00 0.84  CALCIUM 8.3* 9.1   BG: No results for input(s): GLUCAP in the  last 168 hours. Lab Results  Component Value Date   HGBA1C 5.4 04/08/2015   Imaging: CT head 11/15/2015 Old right frontal subcortical white matter infarction. No acute intracranial abnormality seen.  MRI brain 11/15/2015 1. Few punctate foci of acute/early subacute infarction within the right anterior cerebral artery distribution. 2. Underlying chronic right anterior cerebral artery infarction, small chronic right cerebellar infarction, mild chronic microvascular ischemic changes, and parenchymal volume loss. 3. Left parietal scalp lipoma.  Medications:   Scheduled Medications: . sodium chloride   Intravenous Once   PRN Medications: acetaminophen, oxyCODONE-acetaminophen, polyethylene glycol  Assessment/Plan: Pt is a 75 y.o. yo female with a PMHx of rectal cancer (malignant GIST), chronic rectal bleeding with clots, stroke 04/2015 and HTN, who was admitted on 11/15/2015 with symptoms of leg weakness and chronic GI bleed, which was determined to be secondary to right anterior cerebral artery stroke.   Principal Problem:   Left leg weakness Active Problems:   Iron deficiency anemia due to chronic blood loss   GERD (gastroesophageal reflux disease)   GI bleeding   History of CVA (cerebrovascular accident)  Left leg weakness and right facial droop  Her leg weakness has resolved, right facial droop persist. MRI head showed right anterior cerebral artery infarction. Patient has expressed her desire not to take any medication or be subjected to  any intervention. She has had prior stroke and is not compliant with aspirin or statin. PT evaluation recommended skilled PT at discharge however patient declines help at home.  Chronic GI bleed   Patient is hemodynamically stable. No dark stool. No abd pain. Likely slow chronic bleeding. Hemoglobin 3.2 on admission she was given 3 units of PRBCs and IV iron overnight and hemoglobin improved to 9 this morning.  HTN - she declines medical  management  Malignant GIST - Patient does not want to follow up with Dr. Martha Clan or any other doctor. She does not want any therapy. She expressed he desire to not do any intervention or therapy for her cancer or any other medical problems.   Dispo: Anticipated discharge today  LOS: 1 day   Ledell Noss, MD 11/16/2015, 12:00 PM Pager: [phone]

## 2015-11-19 LAB — TYPE AND SCREEN
ABO/RH(D): B POS
Antibody Screen: NEGATIVE
UNIT DIVISION: 0
UNIT DIVISION: 0
UNIT DIVISION: 0
Unit division: 0

## 2016-07-16 IMAGING — MR MR HEAD WO/W CM
10 of 12 series · 34 of 48 positions shown · IV contrast (multihance)
Comparison: CT head April 07, 2015

CLINICAL DATA: Weaker for a few days, mechanical fall yesterday.
Decreased speaking. Evaluate acute encephalopathy. History of rectal
cancer.

EXAM:
MRI HEAD WITHOUT AND WITH CONTRAST
TECHNIQUE: Multiplanar, multiecho pulse sequences of the brain and surrounding
structures were obtained without and with intravenous contrast.
CONTRAST:  13mL MULTIHANCE GADOBENATE DIMEGLUMINE 529 MG/ML IV SOLN

[Series 4: T1 · sagittal · 5.0mm · 0.47mm/px · 3 of 23 slices shown]
[im 1/23]
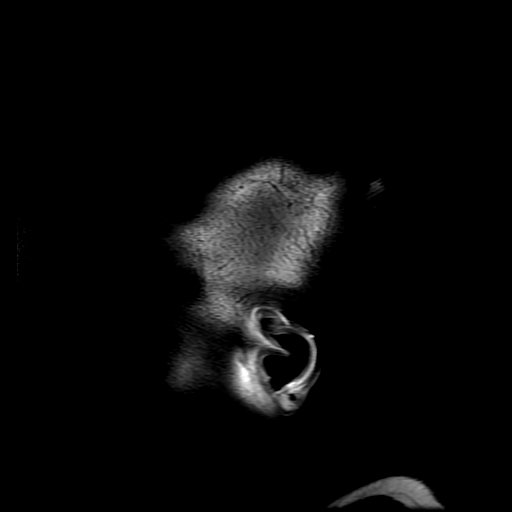
[im 12/23]
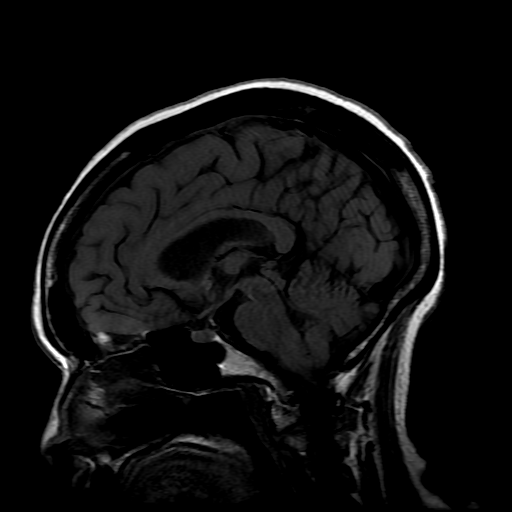
[im 23/23]
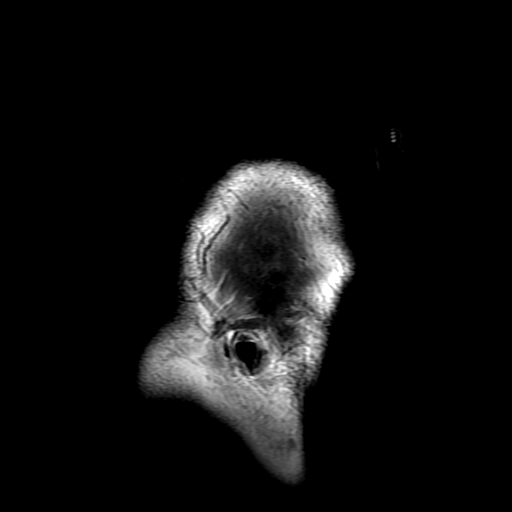

[Series 5: DWI · axial · 3.0mm · 1.09mm/px · z∈[-81,+50]mm · 9 of 90 slices shown (1 of 4)]
[im 1/90]
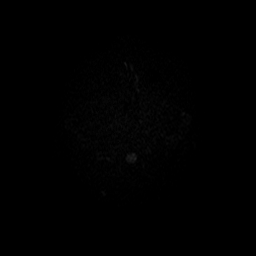
[im 12/90]
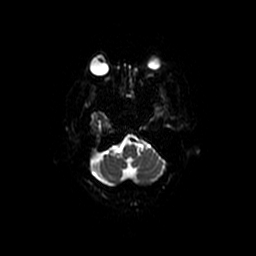
[im 23/90]
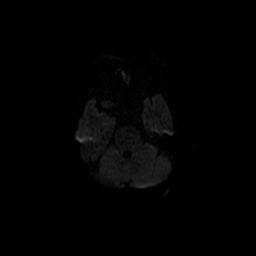
[im 34/90]
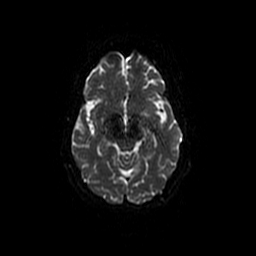
[im 45/90]
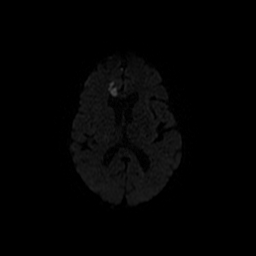
[im 56/90]
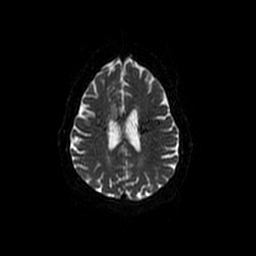
[im 67/90]
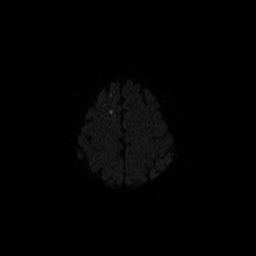
[im 78/90]
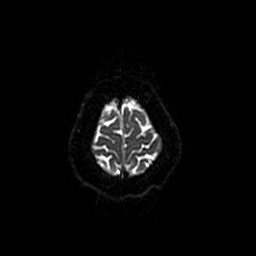
[im 90/90]
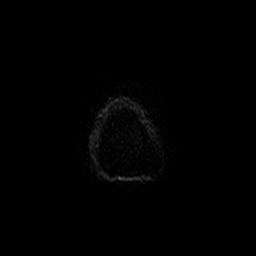

[Series 6: T2 · axial · 5.0mm · 0.43mm/px · z∈[-77,+54]mm · 2 of 23 slices shown]
[im 1/23]
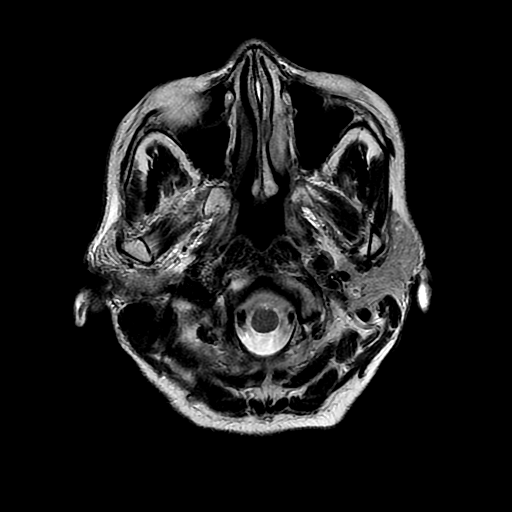
[im 23/23]
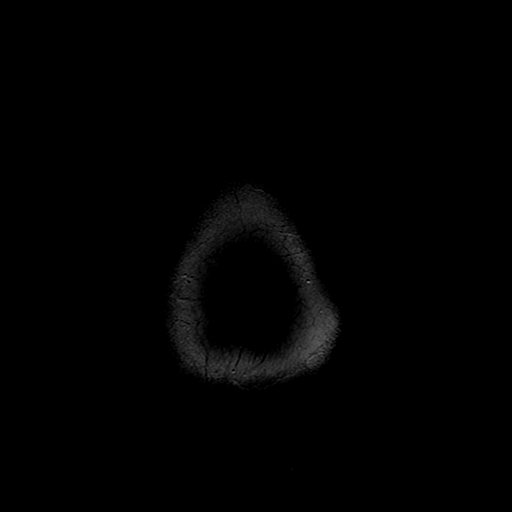

[Series 7: FLAIR · axial · 5.0mm · 0.43mm/px · z∈[-77,+54]mm · 2 of 23 slices shown]
[im 1/23]
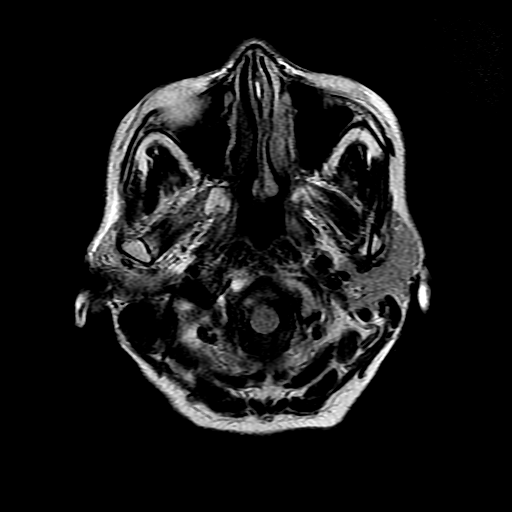
[im 23/23]
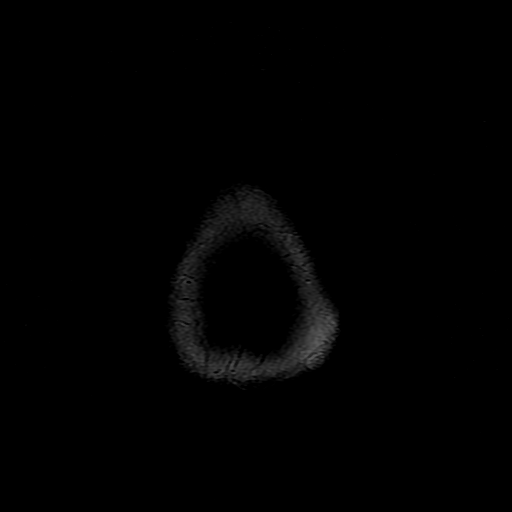

[Series 8: DWI · coronal · 5.0mm · 1.09mm/px · 6 of 68 slices shown (2 of 4)]
[im 1/68]
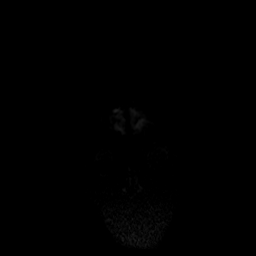
[im 14/68]
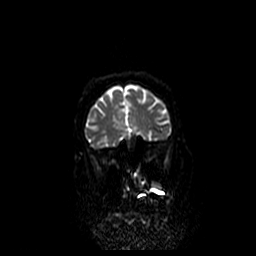
[im 27/68]
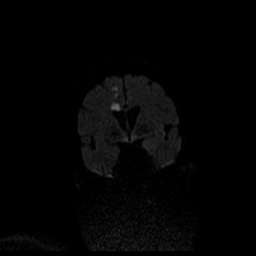
[im 41/68]
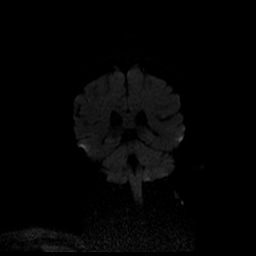
[im 54/68]
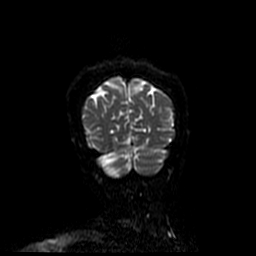
[im 68/68]
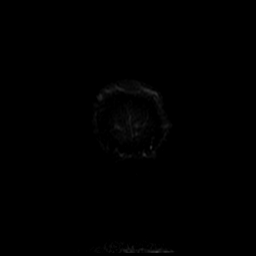

[Series 9: ax mpgr · axial · 5.0mm · 0.43mm/px · 1 of 23 slices shown]
[im 1/23]
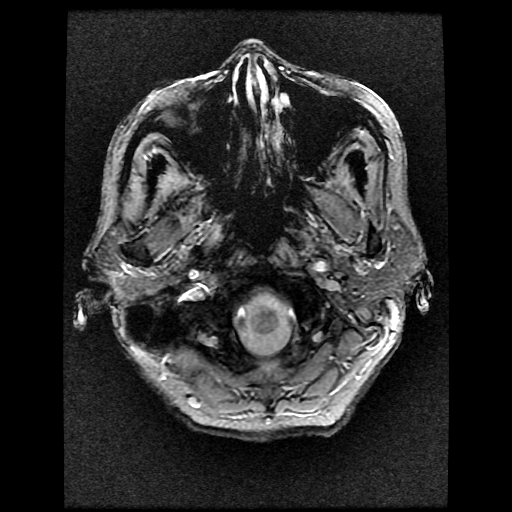

[Series 11: T2 post-contrast · coronal · 5.0mm · 0.39mm/px · 2 of 26 slices shown]
[im 1/26]
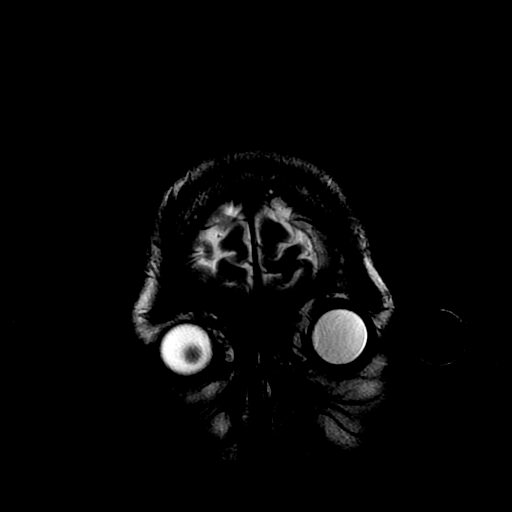
[im 26/26]
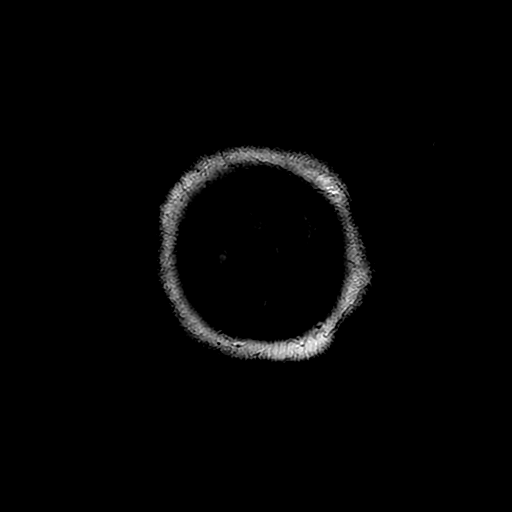

[Series 13: T1 post-contrast · coronal · 5.0mm · 0.39mm/px · 2 of 26 slices shown]
[im 1/26]
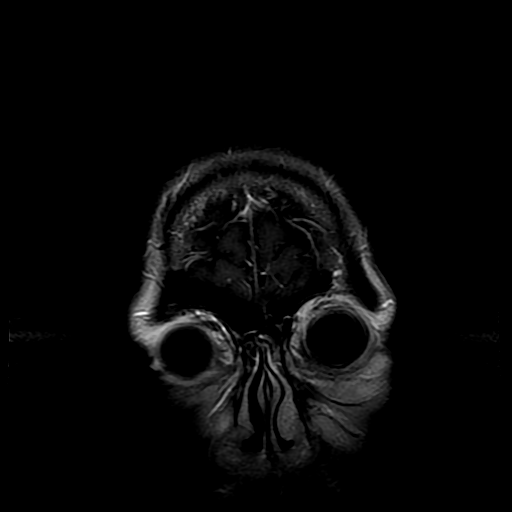
[im 26/26]
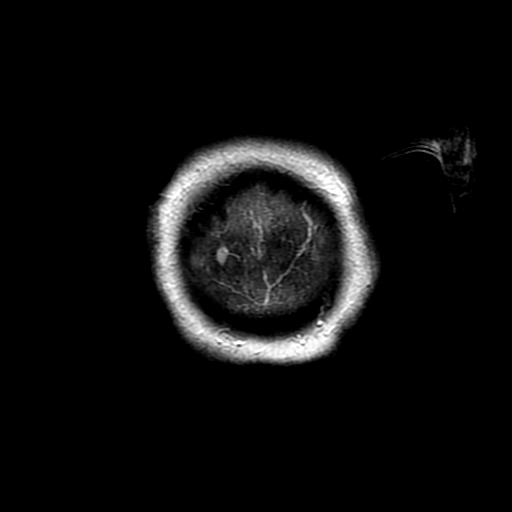

[Series 500: DWI · axial · 3.0mm · 1.09mm/px · z∈[-81,+50]mm · 4 of 45 slices shown (3 of 4)]
[im 1/45]
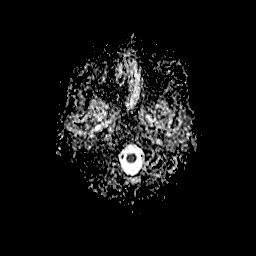
[im 15/45]
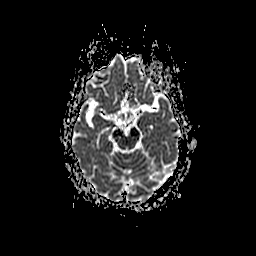
[im 30/45]
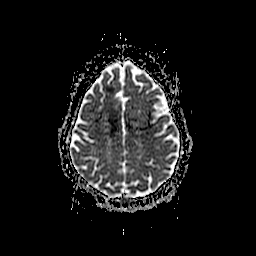
[im 45/45]
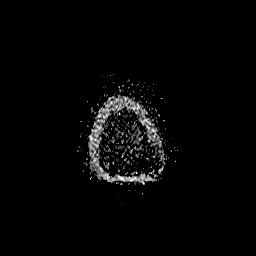

[Series 800: DWI · coronal · 5.0mm · 1.09mm/px · 3 of 34 slices shown (4 of 4)]
[im 1/34]
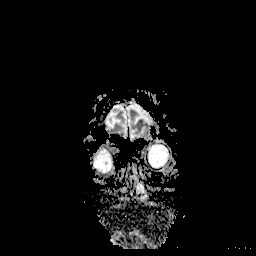
[im 17/34]
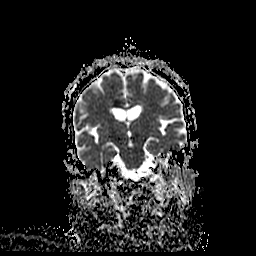
[im 34/34]
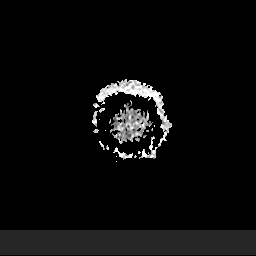

[34 of 48 positions shown; findings below may reference images not displayed]

FINDINGS: Patchy reduced diffusion RIGHT mesial frontal lobe extending to the
periventricular white matter, rostrum of the corpus callosum.
Corresponding low ADC values and mild FLAIR T2 hyperintense signal.
The ventricles and sulci are normal for patient's age. No abnormal
parenchymal signal, mass lesions, mass effect. Patchy supratentorial
white matter FLAIR T2 hyperintensities. Mild RIGHT cerebellar
peripheral atrophy. No abnormal parenchymal enhancement. No
susceptibility artifact to suggest hemorrhage.

No abnormal extra-axial fluid collections. No extra-axial masses nor
leptomeningeal enhancement. Normal major intracranial vascular flow
voids seen at the skull base.

Ocular globes and orbital contents are unremarkable though not
tailored for evaluation. No suspicious calvarial bone marrow signal.
No abnormal sellar expansion. Craniocervical junction maintained.
LEFT maxillary mucosal retention cyst. The mastoid air cells are
well aerated.
IMPRESSION: Acute moderate RIGHT anterior cerebral artery territory infarct.

Involutional changes. Mild to moderate chronic small vessel ischemic
disease.

RIGHT cerebellar folia atrophy could represent prior infarct,
possible sequelae of prior treatment or old trauma.

## 2018-11-10 ENCOUNTER — Other Ambulatory Visit: Payer: Self-pay

## 2018-11-10 ENCOUNTER — Encounter (HOSPITAL_COMMUNITY): Payer: Self-pay

## 2018-11-10 ENCOUNTER — Emergency Department (HOSPITAL_COMMUNITY): Payer: Medicare Other

## 2018-11-10 ENCOUNTER — Inpatient Hospital Stay (HOSPITAL_COMMUNITY)
Admission: EM | Admit: 2018-11-10 | Discharge: 2018-11-13 | DRG: 375 | Disposition: A | Discharge status: Home / Self Care | Payer: Medicare Other | Attending: Internal Medicine | Admitting: Internal Medicine

## 2018-11-10 ENCOUNTER — Emergency Department: Payer: Self-pay

## 2018-11-10 ENCOUNTER — Inpatient Hospital Stay (HOSPITAL_COMMUNITY): Payer: Medicare Other

## 2018-11-10 DIAGNOSIS — Z9221 Personal history of antineoplastic chemotherapy: Secondary | ICD-10-CM | POA: Diagnosis not present

## 2018-11-10 DIAGNOSIS — N179 Acute kidney failure, unspecified: Secondary | ICD-10-CM | POA: Diagnosis present

## 2018-11-10 DIAGNOSIS — Z7189 Other specified counseling: Secondary | ICD-10-CM

## 2018-11-10 DIAGNOSIS — R739 Hyperglycemia, unspecified: Secondary | ICD-10-CM | POA: Diagnosis present

## 2018-11-10 DIAGNOSIS — R0902 Hypoxemia: Secondary | ICD-10-CM | POA: Diagnosis present

## 2018-11-10 DIAGNOSIS — D509 Iron deficiency anemia, unspecified: Secondary | ICD-10-CM | POA: Diagnosis present

## 2018-11-10 DIAGNOSIS — K625 Hemorrhage of anus and rectum: Secondary | ICD-10-CM | POA: Diagnosis present

## 2018-11-10 DIAGNOSIS — D62 Acute posthemorrhagic anemia: Secondary | ICD-10-CM | POA: Diagnosis present

## 2018-11-10 DIAGNOSIS — D72829 Elevated white blood cell count, unspecified: Secondary | ICD-10-CM | POA: Diagnosis present

## 2018-11-10 DIAGNOSIS — E860 Dehydration: Secondary | ICD-10-CM | POA: Diagnosis present

## 2018-11-10 DIAGNOSIS — Z9119 Patient's noncompliance with other medical treatment and regimen: Secondary | ICD-10-CM | POA: Diagnosis not present

## 2018-11-10 DIAGNOSIS — Z20828 Contact with and (suspected) exposure to other viral communicable diseases: Secondary | ICD-10-CM | POA: Diagnosis present

## 2018-11-10 DIAGNOSIS — D696 Thrombocytopenia, unspecified: Secondary | ICD-10-CM | POA: Diagnosis present

## 2018-11-10 DIAGNOSIS — E872 Acidosis: Secondary | ICD-10-CM | POA: Diagnosis present

## 2018-11-10 DIAGNOSIS — C49A0 Gastrointestinal stromal tumor, unspecified site: Principal | ICD-10-CM | POA: Diagnosis present

## 2018-11-10 DIAGNOSIS — K922 Gastrointestinal hemorrhage, unspecified: Secondary | ICD-10-CM | POA: Diagnosis not present

## 2018-11-10 DIAGNOSIS — Z66 Do not resuscitate: Secondary | ICD-10-CM | POA: Diagnosis present

## 2018-11-10 DIAGNOSIS — D649 Anemia, unspecified: Secondary | ICD-10-CM

## 2018-11-10 DIAGNOSIS — Z85048 Personal history of other malignant neoplasm of rectum, rectosigmoid junction, and anus: Secondary | ICD-10-CM | POA: Diagnosis not present

## 2018-11-10 DIAGNOSIS — K219 Gastro-esophageal reflux disease without esophagitis: Secondary | ICD-10-CM | POA: Diagnosis present

## 2018-11-10 DIAGNOSIS — Z90710 Acquired absence of both cervix and uterus: Secondary | ICD-10-CM

## 2018-11-10 LAB — CBC WITH DIFFERENTIAL/PLATELET
Abs Immature Granulocytes: 0.06 10*3/uL (ref 0.00–0.07)
Basophils Absolute: 0 10*3/uL (ref 0.0–0.1)
Basophils Relative: 0 %
Eosinophils Absolute: 0 10*3/uL (ref 0.0–0.5)
Eosinophils Relative: 0 %
HCT: 7.8 % — ABNORMAL LOW (ref 36.0–46.0)
Hemoglobin: 1.8 g/dL — CL (ref 12.0–15.0)
Immature Granulocytes: 1 %
Lymphocytes Relative: 12 %
Lymphs Abs: 1 10*3/uL (ref 0.7–4.0)
MCH: 12.7 pg — ABNORMAL LOW (ref 26.0–34.0)
MCHC: 23.1 g/dL — ABNORMAL LOW (ref 30.0–36.0)
MCV: 54.9 fL — ABNORMAL LOW (ref 80.0–100.0)
Monocytes Absolute: 0.7 10*3/uL (ref 0.1–1.0)
Monocytes Relative: 8 %
Neutro Abs: 6.8 10*3/uL (ref 1.7–7.7)
Neutrophils Relative %: 79 %
Platelets: 67 10*3/uL — ABNORMAL LOW (ref 150–400)
RBC: 1.42 MIL/uL — ABNORMAL LOW (ref 3.87–5.11)
RDW: 23.5 % — ABNORMAL HIGH (ref 11.5–15.5)
WBC: 8.5 10*3/uL (ref 4.0–10.5)
nRBC: 0 % (ref 0.0–0.2)

## 2018-11-10 LAB — COMPREHENSIVE METABOLIC PANEL
ALT: 6 U/L (ref 0–44)
AST: 14 U/L — ABNORMAL LOW (ref 15–41)
Albumin: 2.4 g/dL — ABNORMAL LOW (ref 3.5–5.0)
Alkaline Phosphatase: 67 U/L (ref 38–126)
Anion gap: 9 (ref 5–15)
BUN: 16 mg/dL (ref 8–23)
CO2: 15 mmol/L — ABNORMAL LOW (ref 22–32)
Calcium: 7.6 mg/dL — ABNORMAL LOW (ref 8.9–10.3)
Chloride: 120 mmol/L — ABNORMAL HIGH (ref 98–111)
Creatinine, Ser: 1.45 mg/dL — ABNORMAL HIGH (ref 0.44–1.00)
GFR calc Af Amer: 40 mL/min — ABNORMAL LOW (ref >60)
GFR calc non Af Amer: 35 mL/min — ABNORMAL LOW (ref >60)
Glucose, Bld: 180 mg/dL — ABNORMAL HIGH (ref 70–99)
Potassium: 4 mmol/L (ref 3.5–5.1)
Sodium: 144 mmol/L (ref 135–145)
Total Bilirubin: 0.4 mg/dL (ref 0.3–1.2)
Total Protein: 4.4 g/dL — ABNORMAL LOW (ref 6.5–8.1)

## 2018-11-10 LAB — URINALYSIS, ROUTINE W REFLEX MICROSCOPIC
Bilirubin Urine: NEGATIVE
Glucose, UA: NEGATIVE mg/dL
Hgb urine dipstick: NEGATIVE
Ketones, ur: NEGATIVE mg/dL
Leukocytes,Ua: NEGATIVE
Nitrite: NEGATIVE
Protein, ur: NEGATIVE mg/dL
Specific Gravity, Urine: 1.013 (ref 1.005–1.030)
pH: 5 (ref 5.0–8.0)

## 2018-11-10 LAB — PREPARE RBC (CROSSMATCH)

## 2018-11-10 LAB — RAPID URINE DRUG SCREEN, HOSP PERFORMED
Amphetamines: NOT DETECTED
Barbiturates: NOT DETECTED
Benzodiazepines: NOT DETECTED
Cocaine: NOT DETECTED
Opiates: NOT DETECTED
Tetrahydrocannabinol: NOT DETECTED

## 2018-11-10 LAB — LACTIC ACID, PLASMA
Lactic Acid, Venous: 5.6 mmol/L (ref 0.5–1.9)
Lactic Acid, Venous: 0.6 mmol/L (ref 0.5–1.9)

## 2018-11-10 LAB — LIPASE, BLOOD: Lipase: 18 U/L (ref 11–51)

## 2018-11-10 LAB — PROTIME-INR
INR: 1.3 — ABNORMAL HIGH (ref 0.8–1.2)
Prothrombin Time: 16.3 seconds — ABNORMAL HIGH (ref 11.4–15.2)

## 2018-11-10 LAB — HEMOGLOBIN AND HEMATOCRIT, BLOOD
HCT: 21.3 % — ABNORMAL LOW (ref 36.0–46.0)
Hemoglobin: 6.7 g/dL — CL (ref 12.0–15.0)

## 2018-11-10 LAB — ETHANOL: Alcohol, Ethyl (B): <10 mg/dL (ref <10)

## 2018-11-10 LAB — SARS CORONAVIRUS 2 BY RT PCR (HOSPITAL ORDER, PERFORMED IN ~~LOC~~ HOSPITAL LAB): SARS Coronavirus 2: NEGATIVE

## 2018-11-10 LAB — MRSA PCR SCREENING: MRSA by PCR: NEGATIVE

## 2018-11-10 MED ORDER — ONDANSETRON HCL 4 MG/2ML IJ SOLN: 4.0000 mg | Freq: Once | INTRAMUSCULAR | Status: DC

## 2018-11-10 MED ORDER — SODIUM CHLORIDE 0.9% IV SOLUTION
Freq: Once | INTRAVENOUS | Status: AC
  Administered 2018-11-10: 23:00:00 via INTRAVENOUS

## 2018-11-10 MED ORDER — SODIUM CHLORIDE 0.9 % IV SOLN: INTRAVENOUS | Status: DC

## 2018-11-10 MED ORDER — PANTOPRAZOLE SODIUM 40 MG IV SOLR
40.0000 mg | Freq: Two times a day (BID) | INTRAVENOUS | Status: DC
  Administered 2018-11-10: 40 mg via INTRAVENOUS
  Filled 2018-11-10 (×3): qty 40

## 2018-11-10 MED ORDER — CHLORHEXIDINE GLUCONATE CLOTH 2 % EX PADS: 6.0000 | MEDICATED_PAD | Freq: Every day | CUTANEOUS | Status: DC

## 2018-11-10 MED ORDER — SODIUM CHLORIDE 0.9% FLUSH
10.0000 mL | Freq: Two times a day (BID) | INTRAVENOUS | Status: DC
  Administered 2018-11-10 – 2018-11-13 (×5): 10 mL

## 2018-11-10 MED ORDER — CHLORHEXIDINE GLUCONATE CLOTH 2 % EX PADS
6.0000 | MEDICATED_PAD | Freq: Every day | CUTANEOUS | Status: DC
  Administered 2018-11-10 – 2018-11-12 (×3): 6 via TOPICAL

## 2018-11-10 MED ORDER — SODIUM CHLORIDE 0.9 % IV SOLN: 10.0000 mL/h | Freq: Once | INTRAVENOUS | Status: DC

## 2018-11-10 MED ORDER — SODIUM CHLORIDE 0.9% FLUSH: 10.0000 mL | INTRAVENOUS | Status: DC | PRN

## 2018-11-10 MED ORDER — SODIUM CHLORIDE 0.9 % IV BOLUS: 1000.0000 mL | Freq: Once | INTRAVENOUS | Status: DC

## 2018-11-10 MED ORDER — SODIUM CHLORIDE 0.9 % IV BOLUS
1000.0000 mL | Freq: Once | INTRAVENOUS | Status: AC
  Administered 2018-11-10: 09:00:00 1000 mL via INTRAVENOUS

## 2018-11-10 MED ORDER — HYDRALAZINE HCL 20 MG/ML IJ SOLN: 10.0000 mg | Freq: Four times a day (QID) | INTRAMUSCULAR | Status: DC | PRN

## 2018-11-10 MED ORDER — ONDANSETRON HCL 4 MG/2ML IJ SOLN
4.0000 mg | Freq: Four times a day (QID) | INTRAMUSCULAR | Status: DC | PRN
  Administered 2018-11-11: 4 mg via INTRAVENOUS
  Filled 2018-11-10: qty 2

## 2018-11-10 MED ORDER — ACETAMINOPHEN 325 MG PO TABS
650.0000 mg | ORAL_TABLET | ORAL | Status: DC | PRN
  Administered 2018-11-11 – 2018-11-13 (×4): 650 mg via ORAL
  Filled 2018-11-10 (×5): qty 2

## 2018-11-10 NOTE — ED Notes (Signed)
Third sample of blood taken and sent to the lab. I-STAT was ordered. Daughter called and was updated on her mothers condition.

## 2018-11-10 NOTE — ED Provider Notes (Addendum)
[facility] EMERGENCY DEPARTMENT Provider Note   CSN: [id number] Arrival date & time: 11/10/18  0804     History   Chief Complaint Chief Complaint  Patient presents with   altered/ GI Bleed    HPI Shannon Terry is a 78 y.o. female.     The history is provided by the EMS personnel and medical records. No language interpreter was used.     78 year old female with history of rectal cancer recently discontinued chemotherapy, history of anemia due to blood loss, brought here via EMS for evaluation of the mental status as well as active GI bleeding.  Per EMS, for the past 2 or 3 days patient has had persistent rectal bleeding to come to ER.  This morning became unresponsive and when EMS arrived amount of rectal bleeding was noted and patient was not responding.  However after receiving Zofran and IV fluids more arousable still remains nonverbal.  Level V caveats altered mental status.     Past Medical History:  Diagnosis Date   Acute renal failure (HCC)    Anasarca    Anemia, iron deficiency    chronic blood loss   Gastritis without bleeding    GERD (gastroesophageal reflux disease)    GI bleeding    hx of   Hematochezia    Prior to Diagniosis   Hemorrhoids    History of blood transfusion 04/01/12   Hgb. 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   GERD (gastroesophageal reflux disease)    GI bleeding    Iron deficiency anemia due to chronic blood loss 04/01/2012    Past Surgical  History:  Procedure Laterality Date   ABDOMINAL HYSTERECTOMY     Partial   BIOPSY STOMACH  04/03/12   neg for malignancy   COLONOSCOPY  04/03/2012   Procedure: COLONOSCOPY;  Surgeon: Jerene Bears, MD;  Location: [facility];  Service: Gastroenterology;  Laterality: N/A;   ESOPHAGOGASTRODUODENOSCOPY  04/03/2012   Procedure: ESOPHAGOGASTRODUODENOSCOPY (EGD);  Surgeon: Jerene Bears, MD;  Location: [facility];  Service: Gastroenterology;  Laterality: N/A;   EUS  04/08/2012   Procedure: LOWER ENDOSCOPIC ULTRASOUND (EUS);  Surgeon: Milus Banister, MD;  Location: [facility];  Service: Endoscopy;  Laterality: N/A;   RECTAL BIOPSY  04/03/12   gastrointestinal stromal tumor     OB History   No obstetric history on file.    Obstetric Comments  No HRT         Home Medications    Prior to Admission medications   Medication Sig Start Date End Date Taking? Authorizing Provider  citalopram (CELEXA) 10 MG tablet Take 1 tablet (10 mg total) by mouth daily. Patient not taking: Reported on 11/15/2015 04/11/15   Velvet Bathe, MD  haloperidol (HALDOL) 2 MG/ML solution Take 0.3 mLs (0.6 mg total) by mouth every 4 (four) hours as needed for agitation. Patient not taking: Reported on 11/15/2015 04/11/15   Velvet Bathe, MD  LORazepam (ATIVAN) 2 MG/ML concentrated solution Take  0.5 mLs (1 mg total) by mouth every 4 (four) hours as needed for anxiety. Patient not taking: Reported on 11/15/2015 04/11/15   Velvet Bathe, MD  oxyCODONE-acetaminophen (PERCOCET/ROXICET) 5-325 MG tablet Take 1-2 tablets by mouth every 4 (four) hours as needed for moderate pain. 11/16/15   Ledell Noss, MD  polyethylene glycol [facility] / Floria Raveling) packet Take 17 g by mouth daily as needed for mild constipation. 11/16/15   Ledell Noss, MD    Family History Family History  Problem Relation Age of Onset   Diabetes Mellitus II Father        died from stroke   Leukemia Brother    Pancreatic cancer Sister    Heart attack Brother      Social History Social History   Tobacco Use   Smoking status: Never Smoker   Smokeless tobacco: Never Used   Tobacco comment: never used tobacco  Substance Use Topics   Alcohol use: No   Drug use: No     Allergies   Patient has no known allergies.   Review of Systems Review of Systems  Unable to perform ROS: Mental status change     Physical Exam Updated Vital Signs BP (!) 120/43 (BP Location: Left Arm)    Pulse 89    Temp (!) 96.5 F (35.8 C) (Temporal)    Resp 18    SpO2 (!) 88%   Physical Exam Vitals signs and nursing note reviewed.  Constitutional:      General: She is not in acute distress.    Appearance: She is well-developed.     Comments: Elderly female, ill-appearing, not following commands.  Does not appear to be in any pain.   HENT:     Head: Atraumatic.  Eyes:     Conjunctiva/sclera: Conjunctivae normal.  Neck:     Musculoskeletal: Neck supple.  Cardiovascular:     Rate and Rhythm: Normal rate and regular rhythm.     Pulses: Normal pulses.     Heart sounds: Normal heart sounds.  Pulmonary:     Effort: Pulmonary effort is normal.     Breath sounds: Normal breath sounds.  Abdominal:     Palpations: Abdomen is soft.     Tenderness: There is no abdominal tenderness.  Genitourinary:    Comments: Chaperone present during exam.  Copious amount of maroon color blood and blood clots noted at rectum, rectal tone present, rectal mass palpated Skin:    Coloration: Skin is pale.     Findings: No rash.  Neurological:     Mental Status: She is lethargic.     GCS: GCS eye subscore is 4. GCS verbal subscore is 4. GCS motor subscore is 4.     Motor: Weakness present.  Psychiatric:        Mood and Affect: Affect is flat.        Speech: She is noncommunicative.        Behavior: Behavior is withdrawn.      ED Treatments / Results  Labs (all labs ordered are listed, but only abnormal results are displayed) Labs Reviewed  COMPREHENSIVE  METABOLIC PANEL - Abnormal; Notable for the following components:      Result Value   Chloride 120 (*)    CO2 15 (*)    Glucose, Bld 180 (*)    Creatinine, Ser 1.45 (*)    Calcium 7.6 (*)    Total Protein 4.4 (*)    Albumin 2.4 (*)    AST 14 (*)  GFR calc non Af Amer 35 (*)    GFR calc Af Amer 40 (*)    All other components within normal limits  LACTIC ACID, PLASMA - Abnormal; Notable for the following components:   Lactic Acid, Venous 5.6 (*)    All other components within normal limits  CBC WITH DIFFERENTIAL/PLATELET - Abnormal; Notable for the following components:   RBC 1.42 (*)    Hemoglobin 1.8 (*)    HCT 7.8 (*)    MCV 54.9 (*)    MCH 12.7 (*)    MCHC 23.1 (*)    RDW 23.5 (*)    Platelets 67 (*)    All other components within normal limits  SARS CORONAVIRUS 2 (HOSPITAL ORDER, [place] [facility])  CULTURE, BLOOD (ROUTINE X 2)  CULTURE, BLOOD (ROUTINE X 2)  LIPASE, BLOOD  ETHANOL  CBC WITH DIFFERENTIAL/PLATELET  URINALYSIS, ROUTINE W REFLEX MICROSCOPIC  RAPID URINE DRUG SCREEN, HOSP PERFORMED  CBC WITH DIFFERENTIAL/PLATELET  LACTIC ACID, PLASMA  CBG MONITORING, ED  TYPE AND SCREEN  PREPARE RBC (CROSSMATCH)    EKG EKG Interpretation  Date/Time:  Sunday November 10 2018 08:06:40 EDT Ventricular Rate:  94 PR Interval:    QRS Duration: 132 QT Interval:  403 QTC Calculation: 504 R Axis:   54 Text Interpretation:  Sinus rhythm Left bundle branch block Confirmed by Pattricia Boss [phone]) on 11/10/2018 9:36:00 AM   Radiology Ct Head Wo Contrast  Result Date: 11/10/2018 CLINICAL DATA:  Altered mental status. EXAM: CT HEAD WITHOUT CONTRAST TECHNIQUE: Contiguous axial images were obtained from the base of the skull through the vertex without intravenous contrast. COMPARISON:  Brain MRI and head CT scan 11/15/2015. FINDINGS: Brain: No evidence of acute infarction, hemorrhage, hydrocephalus, extra-axial collection or mass lesion/mass effect. Remote  right paramedian frontal infarct is unchanged. Vascular: Atherosclerosis. Skull: Intact.  No focal lesion. Sinuses/Orbits: Negative. Other: None. IMPRESSION: No acute abnormality. Remote right frontal infarct. Atherosclerosis. Electronically Signed   By: Inge Rise M.D.   On: 11/10/2018 09:22   Dg Chest Portable 1 View  Result Date: 11/10/2018 CLINICAL DATA:  Acute onset vomiting and rectal bleeding today. EXAM: PORTABLE CHEST 1 VIEW COMPARISON:  Single-view of the chest 04/07/2015. FINDINGS: The patient is rotated on the exam. Lungs are clear. Heart size is upper normal. No pneumothorax or pleural fluid. No acute or focal bony abnormality. IMPRESSION: No acute disease. Electronically Signed   By: Inge Rise M.D.   On: 11/10/2018 10:14    Procedures .Critical Care Performed by: Domenic Moras, PA-C Authorized by: Domenic Moras, PA-C   Critical care provider statement:    Critical care time (minutes):  45   Critical care was time spent personally by me on the following activities:  Discussions with consultants, evaluation of patient's response to treatment, examination of patient, ordering and performing treatments and interventions, ordering and review of laboratory studies, ordering and review of radiographic studies, pulse oximetry, re-evaluation of patient's condition, obtaining history from patient or surrogate and review of old charts   (including critical care time)  Medications Ordered in ED Medications  0.9 %  sodium chloride infusion (has no administration in time range)  sodium chloride 0.9 % bolus 1,000 mL (1,000 mLs Intravenous New Bag/Given 11/10/18 0850)     Initial Impression / Assessment and Plan / ED Course  I have reviewed the triage vital signs and the nursing notes.  Pertinent labs & imaging results that were available during my care of the patient  were reviewed by me and considered in my medical decision making (see chart for details).        BP (!) 114/57     Pulse 89    Temp (!) 96.5 F (35.8 C) (Temporal)    Resp 14    Ht 5\' 2"  (1.575 m)    Wt 74 kg    SpO2 (!) 88%    BMI 29.84 kg/m    Final Clinical Impressions(s) / ED Diagnoses   Final diagnoses:  Symptomatic anemia  Gastrointestinal hemorrhage associated with anorectal source  Hypoxemia    ED Discharge Orders    None     8:27 AM Patient with history of rectal cancer and history of anemia due to chronic blood loss here with altered mental status and frank GI bleed.  Patient is mostly nonverbal, however on occasion does respond with appropriate answer.  When asked if she is depressed patient denies.  However she does not choose to follow any other commands.  She does not have any active abdominal pain on exam however she is pale appearing, and is hypotensive she does have frank GI bleed.  Will type and screen, give IV fluid.  Patient is cool to the touch, temperature is 96.5, will apply warming blanket. Initial CBG 305  Multiple attempts to contact family member via phone without success.   8:48 AM Additional information obtained from daughter, Mrs. Kathyrn Drown.  Patient stop chemo treatment approximately 2 years ago.  Her cancer doctor is Dr. Marin Olp.  She started to have rectal bleeding 5 days ago which progressed to bleeding and clots.  Last night she fell in the toilet, EMS arrived, patient does have positive orthostatic vital sign, but she refused to come to the hospital.  This morning she woke up feeling cool and clammy and subsequently vomited.  Daughter also mentioned that patient does not like to go to the hospital and sometimes she will just "shut down" and will not answer question responding to request, this is not unusual for her.  Patient is a full code.  No history of alcohol abuse.  Patient has gist tumor   Daritza Brees was evaluated in Emergency Department on 11/10/2018 for the symptoms described in the history of present illness. She was evaluated in the context of the global  COVID-19 pandemic, which necessitated consideration that the patient might be at risk for infection with the SARS-CoV-2 virus that causes COVID-19. Institutional protocols and algorithms that pertain to the evaluation of patients at risk for COVID-19 are in a state of rapid change based on information released by regulatory bodies including the CDC and federal and state organizations. These policies and algorithms were followed during the patient's care in the ED.  9:36 AM A documente O2 sats of 88% however pt does not appears to have any respiratory distress.  Will obtain CXR and will recheck vital sign.  COVID test ordered.   10:35 AM Difficulty obtaining CBC as pt's blood is too thin for machine to read.  Will continue to obtain appropriate blood work to check H&H.   11:26 AM I have consulted GI specialist Dr. Tarri Glenn who is made aware of pt and will be available for consultation.  WIll consult intensivist for admission.    11:35 AM Appreciate consultation from intensivist who agrees pt will need to be admit to ICU for further management of her anemia 2/2 GI bleed.  I have also update family member of plan.    Please note elevated lactic  acid was not due to sepsis but likely due to dehydration 2/2 blood loss.    Domenic Moras, PA-C 11/10/18 1138    Domenic Moras, PA-C 11/10/18 1140    Pattricia Boss, MD 11/12/18 [phone]

## 2018-11-10 NOTE — ED Notes (Signed)
Patient has spontaneous eye movement, is nonverbal, and does not follow commands. A second IV was placed, labs were drawn, and patient was connected to the monitor. Attending has been notified of the patient's condition.

## 2018-11-10 NOTE — ED Notes (Signed)
Daughter, Webb Silversmith, number in chart, would like updates because person sitting with pt might not be able to report back to her

## 2018-11-10 NOTE — Progress Notes (Signed)
CRITICAL VALUE ALERT  Critical Value: Hgb 6.7  Date & Time Notied: 11/10/2018 @ 2120  Provider Notified: Dellia Nims, RN  Orders Received/Actions taken: See chart

## 2018-11-10 NOTE — ED Notes (Signed)
Patient transported to CT 

## 2018-11-10 NOTE — ED Notes (Signed)
Blood stopped when finished

## 2018-11-10 NOTE — ED Notes (Signed)
MD notified of critical lab value Lactic 5.6

## 2018-11-10 NOTE — H&P (Signed)
NAME:  Shannon Terry, MRN:  [id number], DOB:  Oct 03, 1940, LOS: 0 ADMISSION DATE:  11/10/2018, CONSULTATION DATE: 9/6 REFERRING MD: Donnella Sham, CHIEF COMPLAINT: GI bleed, weakness  Brief History   78 y/o F with GIST tumor (initially diagnosed ~9 years ago, not on therapy for the last 5-6 years) with recurrent bleeding admitted 9/6 with weakness, syncope, GIB with accompanying Hgb of 1.8.    History of present illness   78 year old F who presented to [facility] on 9/6 with reports of weakness, syncopal episode and rectal bleeding.  The patient has a past medical history of GIST tumor.  She was initially diagnosed in approximately 2011.  She was previously followed by Dr. Marin Olp and participated in therapy for 2 to 3 years.  After an increase in chemotherapy dosing she began having side effects and stopped chemotherapy.  She has not been on chemotherapy for approximately 5 to 6 years.  Her daughter is a Equities trader who works in Armed forces operational officer pool at [employer].  She indicates that the patient has refused therapy and does not take medications for chronic disease processes.  She attempted to get her mother to take Tylenol for pain but her mother indicated that did not provide relief and she uses aspirin and NSAIDs.  Patient has had bleeding intermittently over the years that will resolve spontaneously.  She has been admitted in the past with a hemoglobin down to 3 requiring central line and PRBC transfusion.  The patient and family report that this episode of bleeding began approximately on Wednesday 9/2.  The daughter had conversations with the patient and she reported that the bleeding had slowed on Thursday (9/3).  On Friday the patient was able to drive herself to the bank.  Saturday 9/5 the patient had a syncopal episode in the bathroom.  Neighbors were called to help get her out of the floor.  EMS was called at that time but the patient refused transport.  Her daughter reports that the patient was  orthostatic on EMS assessment.  She stayed with her parents overnight and noted that around 0500 the patient was diaphoretic prompting EMS evaluation.  Initial ER evaluation notable for systolic BP as low as 80.  She was given IV fluids with improvement in systolic pressure.  Staff had significant difficulty obtaining labs requiring repeat assessment.  Hemoglobin ultimately resulted at 1.8 g/dL, platelets 67, BUN 16, creatinine 1.45 and lactic acid 5.6.   PCCM consulted for ICU evaluation.  The patient denies pain, shortness of breath.  Of note the patient is quite difficult to obtain information from in regards to medical history.  She is selective in the questions that she will answer regarding her medical history.  At times she will freely answer questions and others will look away from the provider without response.  Her husband is unable to answer questions regarding her medical history.  Patient's daughter Lelon Frohlich Investment banker, corporate) provides medical history.  Past Medical History  Gastrointestinal stromal tumor   Iron deficiency anemia Recurrent GI bleeding -in the setting of GIST tumor GERD  Significant Hospital Events   9/06  Admit with weakness, GIB  Consults:  PCCM    Procedures:    Significant Diagnostic Tests:  9/06   Micro Data:  COVID 9/6 >> negative  BCx2 9/6 >>   Antimicrobials:     Interim history/subjective:  As above.   Objective   Blood pressure (!) 110/54, pulse 96, temperature 97.6 F (36.4 C), temperature source Rectal, resp. rate 14,  height 5\' 2"  (1.575 m), weight 74 kg, SpO2 96 %.        Intake/Output Summary (Last 24 hours) at 11/10/2018 1236 Last data filed at 11/10/2018 0815 Gross per 24 hour  Intake 1000 ml  Output --  Net 1000 ml   Filed Weights   11/10/18 0850  Weight: 74 kg    Examination: General: elderly female lying on ER stretcher, ill appearing but in NAD HEENT: MM pale, dry, pupils =/R Neuro: Awake, alert, answers questions appropriately  CV:  s1s2 rrr, no m/r/g PULM: even/non-labored, lungs bilaterally clear  GI: soft, bsx4 active  Extremities: warm/dry, no edema  Skin: no rashes or lesions  Resolved Hospital Problem list      Assessment & Plan:   Acute on Chronic Blood Loss Anemia  -in setting of GIST Tumor GIST Tumor -not on therapy Thrombocytopenia  P: Transfuse PRBC's  x3 and reassess Hgb post transfusion Utilize blood products for resuscitation  PPI BID  Patient has refused central line placement > will allow PIV or PICC for IV access Trend Hgb/Hct, transfuse per guidelines  O2 as needed to support sats >90%  Acute Kidney Injury  Lactic Acidosis  -in setting of blood loss anemia / relative hypoperfusion  P: Trend BMP / urinary output Replace electrolytes as indicated Avoid nephrotoxic agents, ensure adequate renal perfusion Anticipate lactate will clear with resuscitation, follow up PM lactate  Hyperglycemia  -suspect stress response  P: Monitor glucose Q6 x24 hours, if consistently elevated > 180 add SSI   GOC:  Difficult situation with patient refusing aspects of conversation and therapies. Daughter (RN at [employer]) participated in conversation as well as husband. Family and patient defer decisions to patients husband. Patient refused central venous access.  Reviewed concept of possible GI evaluation once stabilized but patient and family refuse any surgical procedures to address bleeding.  Her primary goal is to feel better and be able to return home.  Will proceed with transfusion, IV placement but no other interventions.  Patient indicates she does not want CPR but she defers to her husband who wishes for CPR.     Best practice:  Diet: NPO  Pain/Anxiety/Delirium protocol (if indicated): n/a VAP protocol (if indicated): n/a  DVT prophylaxis: SCD's  GI prophylaxis: PPI  Glucose control:  Mobility: As tolerated with assist  Code Status: Full Code - see above Family Communication: Patient, Husband,  Daughter Anniya Whiters [phone]) updated by NP.  Family conference held by Dr. Carlis Abbott with patient, husband and 3 children.   Disposition: ICU   Labs   CBC: Recent Labs  Lab 11/10/18 1029  WBC 8.5  NEUTROABS 6.8  HGB 1.8*  HCT 7.8*  MCV 54.9*  PLT 67*    Basic Metabolic Panel: Recent Labs  Lab 11/10/18 0814  NA 144  K 4.0  CL 120*  CO2 15*  GLUCOSE 180*  BUN 16  CREATININE 1.45*  CALCIUM 7.6*   GFR: Estimated Creatinine Clearance: 31.1 mL/min (A) (by C-G formula based on SCr of 1.45 mg/dL (H)). Recent Labs  Lab 11/10/18 1010 11/10/18 1029  WBC  --  8.5  LATICACIDVEN 5.6*  --     Liver Function Tests: Recent Labs  Lab 11/10/18 0814  AST 14*  ALT 6  ALKPHOS 67  BILITOT 0.4  PROT 4.4*  ALBUMIN 2.4*   Recent Labs  Lab 11/10/18 0814  LIPASE 18   No results for input(s): AMMONIA in the last 168 hours.  ABG    Component  Value Date/Time   PHART 7.356 04/01/2012 0418   PCO2ART 48.3 (H) 04/01/2012 0418   PO2ART 32.0 (LL) 04/01/2012 0418   HCO3 27.0 (H) 04/01/2012 0418   TCO2 23 04/07/2015 1404   O2SAT 59.0 04/01/2012 0418     Coagulation Profile: No results for input(s): INR, PROTIME in the last 168 hours.  Cardiac Enzymes: No results for input(s): CKTOTAL, CKMB, CKMBINDEX, TROPONINI in the last 168 hours.  HbA1C: Hgb A1c MFr Bld  Date/Time Value Ref Range Status  04/08/2015 04:49 PM 5.4 4.8 - 5.6 % Final    Comment:    (NOTE)         Pre-diabetes: 5.7 - 6.4         Diabetes: >6.4         Glycemic control for adults with diabetes: <7.0     CBG: No results for input(s): GLUCAP in the last 168 hours.  Review of Systems: Positives in [place]  Gen: Denies fever, chills, weight change, fatigue, night sweats HEENT: Denies blurred vision, double vision, hearing loss, tinnitus, sinus congestion, rhinorrhea, sore throat, neck stiffness, dysphagia PULM: Denies shortness of breath, cough, sputum production, hemoptysis, wheezing CV: Denies chest  pain, edema, orthopnea, paroxysmal nocturnal dyspnea, palpitations GI: Denies abdominal pain, nausea, vomiting, diarrhea, hematochezia, melena, constipation, change in bowel habits GU: Denies dysuria, hematuria, polyuria, oliguria, urethral discharge Endocrine: Denies hot or cold intolerance, polyuria, polyphagia or appetite change Derm: Denies rash, dry skin, scaling or peeling skin change Heme: Denies easy bruising, bleeding, bleeding gums Neuro: Denies headache, numbness, weakness, slurred speech, loss of memory or consciousness   Past Medical History  She,  has a past medical history of Acute renal failure ([place]), Anasarca, Anemia, iron deficiency, Gastritis without bleeding, GERD (gastroesophageal reflux disease), GI bleeding, Hematochezia, Hemorrhoids, History of blood transfusion (04/01/12), Iron deficiency anemia due to chronic blood loss (04/05/12), Malignant GIST ([place]) (05/30/2012), Obesity, and Rectal cancer ([place]) (04/03/12).   Surgical History    Past Surgical History:  Procedure Laterality Date   ABDOMINAL HYSTERECTOMY     Partial   BIOPSY STOMACH  04/03/12   neg for malignancy   COLONOSCOPY  04/03/2012   Procedure: COLONOSCOPY;  Surgeon: Jerene Bears, MD;  Location: [place];  Service: Gastroenterology;  Laterality: N/A;   ESOPHAGOGASTRODUODENOSCOPY  04/03/2012   Procedure: ESOPHAGOGASTRODUODENOSCOPY (EGD);  Surgeon: Jerene Bears, MD;  Location: [place];  Service: Gastroenterology;  Laterality: N/A;   EUS  04/08/2012   Procedure: LOWER ENDOSCOPIC ULTRASOUND (EUS);  Surgeon: Milus Banister, MD;  Location: [facility];  Service: Endoscopy;  Laterality: N/A;   RECTAL BIOPSY  04/03/12   gastrointestinal stromal tumor     Social History   reports that she has never smoked. She has never used smokeless tobacco. She reports that she does not drink alcohol or use drugs.   Family History   Her family history includes Diabetes Mellitus II in her father; Heart attack in her  brother; Leukemia in her brother; Pancreatic cancer in her sister.   Allergies No Known Allergies   Home Medications  Prior to Admission medications   Medication Sig Start Date End Date Taking? Authorizing Provider  ibuprofen (ADVIL) 200 MG tablet Take 200 mg by mouth every 6 (six) hours as needed for moderate pain.   Yes [provider]  citalopram (CELEXA) 10 MG tablet Take 1 tablet (10 mg total) by mouth daily. Patient not taking: Reported on 11/15/2015 04/11/15   Velvet Bathe, MD  haloperidol (HALDOL) 2 MG/ML  solution Take 0.3 mLs (0.6 mg total) by mouth every 4 (four) hours as needed for agitation. Patient not taking: Reported on 11/15/2015 04/11/15   Velvet Bathe, MD  LORazepam (ATIVAN) 2 MG/ML concentrated solution Take 0.5 mLs (1 mg total) by mouth every 4 (four) hours as needed for anxiety. Patient not taking: Reported on 11/15/2015 04/11/15   Velvet Bathe, MD  oxyCODONE-acetaminophen (PERCOCET/ROXICET) 5-325 MG tablet Take 1-2 tablets by mouth every 4 (four) hours as needed for moderate pain. Patient not taking: Reported on 11/10/2018 11/16/15   Ledell Noss, MD  polyethylene glycol [facility] / Floria Raveling) packet Take 17 g by mouth daily as needed for mild constipation. Patient not taking: Reported on 11/10/2018 11/16/15   Ledell Noss, MD     Critical care time: 4 minutes     Noe Gens, NP-C [facility] Pgr: [phone] or if no answer [phone] 11/10/2018, 12:36 PM

## 2018-11-10 NOTE — ED Notes (Signed)
Spoke with lab, they say the machine cannot run either CBC samples sent.

## 2018-11-10 NOTE — ED Notes (Signed)
Temperature unable to be taken, blood finished transfusing on the way to the ICU.

## 2018-11-10 NOTE — ED Notes (Signed)
Patient is now speaking with husband and has improved communication/mentation.

## 2018-11-10 NOTE — ED Triage Notes (Signed)
Patient arrived by [facility] from home. Patient found by daughter this am with frank rectal bleeding and active vomiting. IV established and zofran given prior to arrival. Nonverbal on arrival but will track. Blood noted on stretcher and gown. Denied pain to EMS. Family reports rectal bleeding since Wednesday and refused EMS transport last night. CBG 305 Per EMS has recently discontinued chemo and radiation for rectal cancer

## 2018-11-10 NOTE — Plan of Care (Signed)
Shannon Terry has a history of slow-growing rectal cancer over >5 years, for which she stopped treatment several years ago. She intermittently has LGIBs, which began again a few days ago. She has received blood transfusions in the past, and twice has left the hospital with hospice. She does not see doctors. She refused transport last night after a syncopal/ presyncopal episode, but today was hypotensive and agreed to come to the hospital.  I had a lengthy discussion with Ms. and Shannon Terry and her daughter Shannon Terry at bedside with her son Shannon Terry and daughter Shannon Terry on speakerphone. We discussed that the goal of her care is to transfuse her and have the bleeding stop spontaneously, as it has in the past. Her goal is to go home with he previous quality of life. She does not want procedures or surgeries if she should have progressive bleeding that does not stop spontaneously. She indicated to her family that she does not want chest compressions or shocks in the event of an arrest- that she would want to be made comfortable and pass naturally. Unfortunately her husband disagrees, and she has deferred decision making to her husband, who is the "head of the house". He is a Environmental education officer and feels that God will heal her and all aggressive interventions should be done.   All are in agreement that she does not want procedures to address bleeding. She is willing to accept a PICC line, but has refused a traditional CVC. At this point she will remain full code as she has expressed her desire to defer that decision to her husband, knowing that they disagree. All questions have been answered.   Julian Hy, DO 11/10/18 1:23 PM [facility]

## 2018-11-10 NOTE — ED Notes (Signed)
Transfusion stopped due to IV infiltration on both peripheral IVs. A third IV is being put in at this time.

## 2018-11-10 NOTE — ED Notes (Signed)
New IV placed, blood restarted at 300 mL/hour

## 2018-11-10 NOTE — Progress Notes (Signed)
Peripherally Inserted Central Catheter/Midline Placement  The IV Nurse has discussed with the patient and/or persons authorized to consent for the patient, the purpose of this procedure and the potential benefits and risks involved with this procedure.  The benefits include less needle sticks, lab draws from the catheter, and the patient may be discharged home with the catheter. Risks include, but not limited to, infection, bleeding, blood clot (thrombus formation), and puncture of an artery; nerve damage and irregular heartbeat and possibility to perform a PICC exchange if needed/ordered by physician.  Alternatives to this procedure were also discussed.  Bard Power PICC patient education guide, fact sheet on infection prevention and patient information card has been provided to patient /or left at bedside.  Pt alert and oriented, requested for daughter to sign consent due to not having glasses.  Pt agreeable to procedure, daughter agreeable to procedure.  Consent signed by dtr, Jerlyn Ly, as requested.  PICC/Midline Placement Documentation  PICC Double Lumen 11/10/18 PICC Right Brachial 37 cm 2 cm (Active)  Indication for Insertion or Continuance of Line Poor Vasculature-patient has had multiple peripheral attempts or PIVs lasting less than 24 hours;Limited venous access - need for IV therapy >5 days (PICC only) 11/10/18 1826  Exposed Catheter (cm) 2 cm 11/10/18 1826  Site Assessment Clean;Dry;Intact 11/10/18 1826  Lumen #1 Status Flushed;Saline locked;Blood return noted 11/10/18 1826  Lumen #2 Status Flushed;Saline locked;Blood return noted 11/10/18 1826  Dressing Type Transparent 11/10/18 1826  Dressing Status Clean;Dry;Intact;Antimicrobial disc in place 11/10/18 [place] checked and tightened 11/10/18 1826  Line Adjustment (NICU/IV Team Only) No 11/10/18 1826  Dressing Intervention New dressing 11/10/18 1826  Dressing Change Due 11/17/18 11/10/18 1826       Rolena Infante 11/10/2018, 6:27 PM

## 2018-11-10 NOTE — ED Notes (Signed)
SPO2 observed to be 88% on room air. Attending was notified and 2 liters of oxygen was applied via nasal cannula.

## 2018-11-10 NOTE — ED Notes (Signed)
Physician at bedside speaking with pt.

## 2018-11-10 NOTE — Progress Notes (Signed)
[facility] Progress Note Patient Name: Shannon Terry DOB: [birth year] MRN: [id number]   Date of Service  11/10/2018  HPI/Events of Note  Anemia - Hgb = 6.7 after 3 units PRBC.  eICU Interventions  Will order: 1. Transfuse 1 unit PRBC now. 2. H/H at 2 AM.     Intervention Category Major Interventions: Other:  Lysle Dingwall 11/10/2018, 9:58 PM

## 2018-11-11 ENCOUNTER — Inpatient Hospital Stay (HOSPITAL_COMMUNITY): Payer: Medicare Other

## 2018-11-11 DIAGNOSIS — D62 Acute posthemorrhagic anemia: Secondary | ICD-10-CM

## 2018-11-11 LAB — CBC
HCT: 26 % — ABNORMAL LOW (ref 36.0–46.0)
HCT: 22.2 % — ABNORMAL LOW (ref 36.0–46.0)
HCT: 30 % — ABNORMAL LOW (ref 36.0–46.0)
Hemoglobin: 8.6 g/dL — ABNORMAL LOW (ref 12.0–15.0)
Hemoglobin: 7.3 g/dL — ABNORMAL LOW (ref 12.0–15.0)
Hemoglobin: 10.3 g/dL — ABNORMAL LOW (ref 12.0–15.0)
MCH: 24.7 pg — ABNORMAL LOW (ref 26.0–34.0)
MCH: 24.1 pg — ABNORMAL LOW (ref 26.0–34.0)
MCH: 27.2 pg (ref 26.0–34.0)
MCHC: 33.1 g/dL (ref 30.0–36.0)
MCHC: 32.9 g/dL (ref 30.0–36.0)
MCHC: 34.3 g/dL (ref 30.0–36.0)
MCV: 74.7 fL — ABNORMAL LOW (ref 80.0–100.0)
MCV: 73.3 fL — ABNORMAL LOW (ref 80.0–100.0)
MCV: 79.4 fL — ABNORMAL LOW (ref 80.0–100.0)
Platelets: 244 10*3/uL (ref 150–400)
Platelets: 213 10*3/uL (ref 150–400)
Platelets: 163 10*3/uL (ref 150–400)
RBC: 3.48 MIL/uL — ABNORMAL LOW (ref 3.87–5.11)
RBC: 3.03 MIL/uL — ABNORMAL LOW (ref 3.87–5.11)
RBC: 3.78 MIL/uL — ABNORMAL LOW (ref 3.87–5.11)
RDW: 23.9 % — ABNORMAL HIGH (ref 11.5–15.5)
WBC: 16 10*3/uL — ABNORMAL HIGH (ref 4.0–10.5)
WBC: 17.9 10*3/uL — ABNORMAL HIGH (ref 4.0–10.5)
WBC: 19.3 10*3/uL — ABNORMAL HIGH (ref 4.0–10.5)
nRBC: 0.2 % (ref 0.0–0.2)
nRBC: 0.5 % — ABNORMAL HIGH (ref 0.0–0.2)
nRBC: 0.2 % (ref 0.0–0.2)

## 2018-11-11 LAB — BASIC METABOLIC PANEL
Anion gap: 6 (ref 5–15)
BUN: 17 mg/dL (ref 8–23)
CO2: 18 mmol/L — ABNORMAL LOW (ref 22–32)
Calcium: 8 mg/dL — ABNORMAL LOW (ref 8.9–10.3)
Chloride: 121 mmol/L — ABNORMAL HIGH (ref 98–111)
Creatinine, Ser: 1.3 mg/dL — ABNORMAL HIGH (ref 0.44–1.00)
GFR calc Af Amer: 46 mL/min — ABNORMAL LOW (ref >60)
GFR calc non Af Amer: 40 mL/min — ABNORMAL LOW (ref >60)
Glucose, Bld: 113 mg/dL — ABNORMAL HIGH (ref 70–99)
Potassium: 3.9 mmol/L (ref 3.5–5.1)
Sodium: 145 mmol/L (ref 135–145)

## 2018-11-11 LAB — GLUCOSE, CAPILLARY: Glucose-Capillary: 109 mg/dL — ABNORMAL HIGH (ref 70–99)

## 2018-11-11 LAB — HEMOGLOBIN AND HEMATOCRIT, BLOOD
HCT: 27 % — ABNORMAL LOW (ref 36.0–46.0)
Hemoglobin: 9 g/dL — ABNORMAL LOW (ref 12.0–15.0)

## 2018-11-11 LAB — PREPARE RBC (CROSSMATCH)

## 2018-11-11 MED ORDER — SODIUM CHLORIDE 0.9 % IV BOLUS
500.0000 mL | Freq: Once | INTRAVENOUS | Status: AC
  Administered 2018-11-11: 500 mL via INTRAVENOUS

## 2018-11-11 MED ORDER — SODIUM CHLORIDE 0.9% IV SOLUTION
Freq: Once | INTRAVENOUS | Status: AC
  Administered 2018-11-11: 12:00:00 via INTRAVENOUS

## 2018-11-11 MED ORDER — PANTOPRAZOLE SODIUM 40 MG IV SOLR
40.0000 mg | INTRAVENOUS | Status: DC
  Administered 2018-11-11 – 2018-11-13 (×3): 40 mg via INTRAVENOUS
  Filled 2018-11-11 (×2): qty 40

## 2018-11-11 NOTE — H&P (Addendum)
NAME:  Shannon Terry, MRN:  [id number], DOB:  [birth year], LOS: 1 ADMISSION DATE:  11/10/2018, CONSULTATION DATE: 9/6 REFERRING MD: Donnella Sham, CHIEF COMPLAINT: GI bleed, weakness  Brief History   78 y/o F with GIST tumor (initially diagnosed ~9 years ago, not on therapy for the last 5-6 years) with recurrent bleeding admitted 9/6 with weakness, syncope, GIB with accompanying Hgb of 1.8.    Past Medical History  Gastrointestinal stromal tumor   Iron deficiency anemia Recurrent GI bleeding -in the setting of GIST tumor GERD  Significant Hospital Events   9/06  Admit with weakness, GIB 9/07  Hgb up to 8.7, ongoing rectal bleeding  Consults:  PCCM  GI - consulted in ER, deferred as patient did not want interventions   Procedures:  RUE PICC 9/6 >>  Significant Diagnostic Tests:     Micro Data:  COVID 9/6 >> negative  BCx2 9/6 >>   Antimicrobials:     Interim history/subjective:  Pt reports feeling better.  Hopeful to be able to eat / drink today.  Reports episodes of rectal bleeding overnight. Transfused total 4 units since admit.  Denies pain / shortness of breath.   Objective   Blood pressure (!) 159/78, pulse 81, temperature 98.9 F (37.2 C), temperature source Oral, resp. rate 16, height 5\' 2"  (1.575 m), weight 70 kg, SpO2 99 %.        Intake/Output Summary (Last 24 hours) at 11/11/2018 0749 Last data filed at 11/11/2018 0600 Gross per 24 hour  Intake 2575 ml  Output 600 ml  Net 1975 ml   Filed Weights   11/10/18 0850 11/11/18 0310  Weight: 74 kg 70 kg    Examination: General: elderly female lying in bed in NAD HEENT: MM pink/dry, no jvd Neuro: Awake, brighter/more interactive on this exam compared to 9/6, MAE CV: s1s2 rrr, no m/r/g PULM:  Even/non-labored, lungs bilaterally clear  GI: soft, bsx4 active, bright red / bloody BM in bed soaked the pad / sheet (estimated EBL difficult to determine / mixed with urine) Extremities: warm/dry, no edema  Skin: no  rashes or lesions  Resolved Hospital Problem list   Lactic acidosis   Assessment & Plan:   Acute on Chronic Blood Loss Anemia  -in setting of GIST Tumor Thrombocytopenia  P: Follow up H/H now PPI QD Transfuse as appropriate per guidelines  O2 as needed to support sats > 90% Family / patient refused central line but willing to accept PIV or PICC   GIST Tumor -not on therapy GIB P: Hopeful this will resolve spontaneously as she does not want interventions (surgery / procedures) to address bleeding  Acute Kidney Injury  -in setting of blood loss anemia / relative hypoperfusion  P: Trend BMP / urinary output Replace electrolytes as indicated Avoid nephrotoxic agents, ensure adequate renal perfusion   Hyperglycemia  -suspect stress response  P: Follow on BMP  GOC:  9/6 Discussion: ifficult situation with patient refusing aspects of conversation and therapies. Daughter (RN at [employer]) participated in conversation as well as husband. Family and patient defer decisions to patients husband. Patient refused central venous access.  Reviewed concept of possible GI evaluation once stabilized but patient and family refuse any surgical procedures to address bleeding.  Her primary goal is to feel better and be able to return home.  Will proceed with transfusion, IV placement but no other interventions.  Patient indicates she does not want CPR but she defers to her husband who wishes for CPR.  Best practice:  Diet: NPO  Pain/Anxiety/Delirium protocol (if indicated): n/a VAP protocol (if indicated): n/a  DVT prophylaxis: SCD's  GI prophylaxis: PPI  Glucose control:  Mobility: As tolerated with assist  Code Status: Full Code - see above Family Communication: Daughter Maika Mcelveen, RN [phone]) updated 9/7 via phone.  Disposition: ICU.  Goal is for patient to return home once stabilized. Pt / family would not consider placement of any kind.  Labs   CBC: Recent Labs  Lab 11/10/18  1029 11/10/18 2010 11/11/18 0305  WBC 8.5  --  16.0*  NEUTROABS 6.8  --   --   HGB 1.8* 6.7* 8.6*  HCT 7.8* 21.3* 26.0*  MCV 54.9*  --  74.7*  PLT 67*  --  106    Basic Metabolic Panel: Recent Labs  Lab 11/10/18 0814 11/11/18 0305  NA 144 145  K 4.0 3.9  CL 120* 121*  CO2 15* 18*  GLUCOSE 180* 113*  BUN 16 17  CREATININE 1.45* 1.30*  CALCIUM 7.6* 8.0*   GFR: Estimated Creatinine Clearance: 33.8 mL/min (A) (by C-G formula based on SCr of 1.3 mg/dL (H)). Recent Labs  Lab 11/10/18 1010 11/10/18 1029 11/10/18 2010 11/11/18 0305  WBC  --  8.5  --  16.0*  LATICACIDVEN 5.6*  --  0.6  --     Liver Function Tests: Recent Labs  Lab 11/10/18 0814  AST 14*  ALT 6  ALKPHOS 67  BILITOT 0.4  PROT 4.4*  ALBUMIN 2.4*   Recent Labs  Lab 11/10/18 0814  LIPASE 18   No results for input(s): AMMONIA in the last 168 hours.  ABG    Component Value Date/Time   PHART 7.356 04/01/2012 0418   PCO2ART 48.3 (H) 04/01/2012 0418   PO2ART 32.0 (LL) 04/01/2012 0418   HCO3 27.0 (H) 04/01/2012 0418   TCO2 23 04/07/2015 1404   O2SAT 59.0 04/01/2012 0418     Coagulation Profile: Recent Labs  Lab 11/10/18 2010  INR 1.3*    Cardiac Enzymes: No results for input(s): CKTOTAL, CKMB, CKMBINDEX, TROPONINI in the last 168 hours.  HbA1C: Hgb A1c MFr Bld  Date/Time Value Ref Range Status  04/08/2015 04:49 PM 5.4 4.8 - 5.6 % Final    Comment:    (NOTE)         Pre-diabetes: 5.7 - 6.4         Diabetes: >6.4         Glycemic control for adults with diabetes: <7.0     CBG: No results for input(s): GLUCAP in the last 168 hours.   Critical care time:      Noe Gens, NP-C [facility] Pgr: [phone] or if no answer [phone] 11/11/2018, 7:49 AM

## 2018-11-12 DIAGNOSIS — K625 Hemorrhage of anus and rectum: Secondary | ICD-10-CM

## 2018-11-12 LAB — BPAM RBC
Blood Product Expiration Date: 202009072359
Blood Product Expiration Date: 202009142359
Blood Product Expiration Date: 202009162359
Blood Product Expiration Date: 202009212359
Blood Product Expiration Date: 202009272359
Blood Product Expiration Date: 202009282359
ISSUE DATE / TIME: 202009061129
ISSUE DATE / TIME: 202009061130
ISSUE DATE / TIME: 202009061613
ISSUE DATE / TIME: 202009062215
ISSUE DATE / TIME: 202009071146
ISSUE DATE / TIME: 202009071402
Unit Type and Rh: 7300
Unit Type and Rh: 7300
Unit Type and Rh: 7300
Unit Type and Rh: 7300
Unit Type and Rh: 7300
Unit Type and Rh: 7300

## 2018-11-12 LAB — TYPE AND SCREEN
ABO/RH(D): B POS
Antibody Screen: NEGATIVE
Unit division: 0
Unit division: 0
Unit division: 0
Unit division: 0
Unit division: 0
Unit division: 0

## 2018-11-12 LAB — BASIC METABOLIC PANEL
Anion gap: 9 (ref 5–15)
BUN: 20 mg/dL (ref 8–23)
CO2: 17 mmol/L — ABNORMAL LOW (ref 22–32)
Calcium: 7.9 mg/dL — ABNORMAL LOW (ref 8.9–10.3)
Chloride: 117 mmol/L — ABNORMAL HIGH (ref 98–111)
Creatinine, Ser: 1.34 mg/dL — ABNORMAL HIGH (ref 0.44–1.00)
GFR calc Af Amer: 44 mL/min — ABNORMAL LOW (ref >60)
GFR calc non Af Amer: 38 mL/min — ABNORMAL LOW (ref >60)
Glucose, Bld: 130 mg/dL — ABNORMAL HIGH (ref 70–99)
Potassium: 3.5 mmol/L (ref 3.5–5.1)
Sodium: 143 mmol/L (ref 135–145)

## 2018-11-12 LAB — CBC
HCT: 29.9 % — ABNORMAL LOW (ref 36.0–46.0)
Hemoglobin: 10.2 g/dL — ABNORMAL LOW (ref 12.0–15.0)
MCH: 27.4 pg (ref 26.0–34.0)
MCHC: 34.1 g/dL (ref 30.0–36.0)
MCV: 80.4 fL (ref 80.0–100.0)
Platelets: 157 10*3/uL (ref 150–400)
RBC: 3.72 MIL/uL — ABNORMAL LOW (ref 3.87–5.11)
RDW: 24.6 % — ABNORMAL HIGH (ref 11.5–15.5)
WBC: 19.1 10*3/uL — ABNORMAL HIGH (ref 4.0–10.5)
nRBC: 0.3 % — ABNORMAL HIGH (ref 0.0–0.2)

## 2018-11-12 LAB — GLUCOSE, CAPILLARY: Glucose-Capillary: 105 mg/dL — ABNORMAL HIGH (ref 70–99)

## 2018-11-12 NOTE — Evaluation (Signed)
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  She will benefit from skilled PT while in acute setting.  Currently plans likely for follow up Hospice care.  Patient declined need for further home equipment.  Will follow up if not d/c.    Follow Up Recommendations No PT follow up(likely for Hospice)    Equipment Recommendations  None recommended by PT(declined wheelchair for home)    Recommendations for Other Services       Precautions / Restrictions Precautions Precautions: Fall      Mobility  Bed Mobility Overal bed mobility: Needs Assistance Bed Mobility: Supine to Sit     Supine to sit: Min guard     General bed mobility comments: increased time, assist for steadying  Transfers Overall transfer level: Needs assistance Equipment used: Rolling walker (2 wheeled);1 person hand held assist Transfers: Sit to/from [facility] Sit to Stand: Min guard Stand pivot transfers: Min assist       General transfer comment: to [facility] with HHA and min A for balance; from 3:1 and chair to walker minguard for balance  Ambulation/Gait Ambulation/Gait assistance: Min assist;Min guard Gait Distance (Feet): 40 Feet(& 12') Assistive device: Rolling walker (2 wheeled) Gait Pattern/deviations: Step-to pattern;Step-through pattern;Decreased stride length;Shuffle     General Gait Details: encouraged to walk short distance to nursing station and back to room, then walked around bed to recliner  Stairs            Wheelchair Mobility     Modified Rankin (Stroke Patients Only)       Balance Overall balance assessment: Needs assistance   Sitting balance-Leahy Scale: Fair     Standing balance support: Single extremity supported Standing balance-Leahy Scale: Poor Standing balance comment: UE support for balance, patient not attempting to clean herself after toileting so assisted while she held onto walker                             Pertinent Vitals/Pain Pain Assessment: Faces Faces Pain Scale: Hurts little more Pain Location: rectal pain when attempting to toilet Pain Descriptors / Indicators: Sharp Pain Intervention(s): Repositioned;Monitored during session    Home Living Family/patient expects to be discharged to:: Private residence Living Arrangements: Spouse/significant other Available Help at Discharge: Family;Available 24 hours/day Type of Home: House Home Access: Stairs to enter   [employer] of Steps: 1 Home Layout: One level Home Equipment: Walker - 2 wheels      Prior Function Level of Independence: Independent         Comments: was not using walker and was taking tub baths     Hand Dominance        Extremity/Trunk Assessment   Upper Extremity Assessment Upper Extremity Assessment: Generalized weakness    Lower Extremity Assessment Lower Extremity Assessment: Generalized weakness       Communication   Communication: No difficulties  Cognition Arousal/Alertness: Awake/alert Behavior During Therapy: Flat affect Overall Cognitive Status: Difficult to assess  General Comments: patient limiting verbal responses to questions      General Comments General comments (skin integrity, edema, etc.): Daughter reports patient has not eaten since Saturday and not been out of bed, hoping to take her home, but wanting to make sure she can move with their help.    Exercises     Assessment/Plan    PT Assessment  Patient needs continued PT services  PT Problem List Decreased strength;Decreased activity tolerance;Decreased mobility;Decreased knowledge of use of DME;Decreased balance       PT Treatment Interventions DME instruction;Stair training;Therapeutic activities;Gait training;Functional mobility training;Therapeutic exercise;Balance training;Patient/family education    PT Goals (Current goals can be found in the Care Plan section)  Acute Rehab PT Goals Patient Stated Goal: to take pt home PT Goal Formulation: With patient/family Time For Goal Achievement: 11/19/18 Potential to Achieve Goals: Good    Frequency Min 3X/week   Barriers to discharge        Co-evaluation               AM-PAC PT "6 Clicks" Mobility  Outcome Measure Help needed turning from your back to your side while in a flat bed without using bedrails?: A Little Help needed moving from lying on your back to sitting on the side of a flat bed without using bedrails?: A Little Help needed moving to and from a bed to a chair (including a wheelchair)?: A Little Help needed standing up from a chair using your arms (e.g., wheelchair or bedside chair)?: A Little Help needed to walk in hospital room?: A Little Help needed climbing 3-5 steps with a railing? : A Lot 6 Click Score: 17    End of Session   Activity Tolerance: Patient tolerated treatment well Patient left: in chair;with call bell/phone within reach;with family/visitor present   PT Visit Diagnosis: Muscle weakness (generalized) (M62.81);Other abnormalities of gait and mobility (R26.89)    Time: 8921-1941 PT Time Calculation (min) (ACUTE ONLY): 37 min   Charges:   PT Evaluation $PT Eval Moderate Complexity: 1 Mod PT Treatments $Gait Training: 8-22 mins        Magda Kiel, PT Acute Rehabilitation Services [phone] 11/12/2018   Reginia Naas 11/12/2018, 1:19 PM

## 2018-11-12 NOTE — Care Plan (Signed)
Met with patient and discussed case with daughter. Patient does not want endoscopic intervention but wants massive transfusion which is an inconsistent plan.  She has been on hospice previously.  I think re-establishing goals of care will be important- palliative care consult placed. I understand [facility] is taking over- I am happy to fill out FMLA paperwork but would appreciate if they help navigate patient through remainder of stay.  Erskine Emery MD

## 2018-11-12 NOTE — Progress Notes (Addendum)
PROGRESS NOTE    Shannon Terry  ZOX:[id number] DOB: December 01, 1940 DOA: 11/10/2018 PCP: Patient, No Pcp Per   Brief Narrative:  78 y/o F with GIST tumor (initially diagnosed ~9 years ago, not on therapy for the last 5-6 years) with recurrent rectal bleeding admitted 9/6 with weakness, syncope, GIB with accompanying Hgb of 1.8. She was transfused 4 units PRBC and hemoglobin improved from 1.8 to 8.6.She continued  to have active bleeding .Recently  hemoglobin has been stable and today she does not have bleeding.  She continues to refuse EGD/colonoscopy  and hence GI consult was not called. She has been on hospice previously but after she improved, she refused to allow them into her house.  Plan is to discharge her home after bleeding stop completely.  Palliative care is meeting with family today for discussion of goals of care.  Assessment & Plan:   Active Problems:   GIB (gastrointestinal bleeding)   Acute on chronic blood loss anemia/GI bleed: Most likely secondary to GIST tumor.  Patient denies any endoscopic evaluation.  GI not consulted.  She presented with hemoglobin of 1.8.  She was transfused with 4 units of PRBC.  Patient is open for transfusion but she does not want any intervention or procedures.  This morning her hemoglobin is 10.2. No rectal bleed today.  Had several episodes of bleeding yesterday.  Denies any abdominal pain, nausea or vomiting.  GIST tumor: Previously was on chemotherapy and was following with Dr. Marin Olp.  She stopped chemotherapy on her will.  She was also following with hospice at home but not following any more currently.  Denies any treatment or procedure for this.  Acute kidney injury: Most likely the setting of acute blood loss anemia/ hypoperfusion.  Continue to monitor BMP  Thrombocytopenia: Mild.  Continue to follow  Leukocytosis: Unknown etiology.  Low suspicion for infectious process.  We will continue to monitor.  She is afebrile.  Blood cultures have  not shown any growth.  Goals of care : Patient has been made DNR after discussion with family.  Decision will be taken by daughter and patient's husband.  Patient's goal is to return to home.  Palliative care care has been consulted.  Will wait for discussion with family by palliative care today.  Generalized weakness: Requested for physical therapy assessment         DVT prophylaxis: SCD Code Status: DNR Family Communication:  Disposition Plan: Home after palliative care meeting, if results remain stable.    Procedures: None  Antimicrobials:  Anti-infectives (From admission, onward)   None      Subjective: Patient seen and examined the bedside this morning.  Currently hemodynamically stable.  Looked comfortable.  Denies any abdominal pain nausea, vomiting.  No rectal bleeding today.  She says that she feels weak and feels she is not ready for discharge today.  Objective: Vitals:   11/12/18 0800 11/12/18 0830 11/12/18 0900 11/12/18 0930  BP: 131/65 (!) 148/68 (!) 160/87 132/76  Pulse: 89 (!) 101 86 83  Resp: 17 18 16 16   Temp:      TempSrc:      SpO2: 97% 99% 99% 99%  Weight:      Height:        Intake/Output Summary (Last 24 hours) at 11/12/2018 1059 Last data filed at 11/11/2018 1715 Gross per 24 hour  Intake 1200 ml  Output -  Net 1200 ml   Filed Weights   11/10/18 0850 11/11/18 0310 11/12/18 0400  Weight: 74  kg 70 kg 67.8 kg    Examination:  General exam: Appears calm and comfortable ,Not in distress,average built HEENT:PERRL,Oral mucosa moist, Ear/Nose normal on gross exam Respiratory system: Bilateral equal air entry, normal vesicular breath sounds, no wheezes or crackles  Cardiovascular system: S1 & S2 heard, RRR. No JVD, murmurs, rubs, gallops or clicks. No pedal edema. Gastrointestinal system: Abdomen distended, soft and nontender. No organomegaly or masses felt. Normal bowel sounds heard. Central nervous system: Alert and oriented. No focal  neurological deficits. Extremities: No edema, no clubbing ,no cyanosis, distal peripheral pulses palpable. Skin: No rashes, lesions or ulcers,no icterus ,no pallor   Data Reviewed: I have personally reviewed following labs and imaging studies  CBC: Recent Labs  Lab 11/10/18 1029  11/11/18 0305 11/11/18 0826 11/11/18 1150 11/11/18 1943 11/12/18 0330  WBC 8.5  --  16.0*  --  17.9* 19.3* 19.1*  NEUTROABS 6.8  --   --   --   --   --   --   HGB 1.8*   < > 8.6* 9.0* 7.3* 10.3* 10.2*  HCT 7.8*   < > 26.0* 27.0* 22.2* 30.0* 29.9*  MCV 54.9*  --  74.7*  --  73.3* 79.4* 80.4  PLT 67*  --  244  --  213 163 157   < > = values in this interval not displayed.   Basic Metabolic Panel: Recent Labs  Lab 11/10/18 0814 11/11/18 0305 11/12/18 0330  NA 144 145 143  K 4.0 3.9 3.5  CL 120* 121* 117*  CO2 15* 18* 17*  GLUCOSE 180* 113* 130*  BUN 16 17 20   CREATININE 1.45* 1.30* 1.34*  CALCIUM 7.6* 8.0* 7.9*   GFR: Estimated Creatinine Clearance: 32.3 mL/min (A) (by C-G formula based on SCr of 1.34 mg/dL (H)). Liver Function Tests: Recent Labs  Lab 11/10/18 0814  AST 14*  ALT 6  ALKPHOS 67  BILITOT 0.4  PROT 4.4*  ALBUMIN 2.4*   Recent Labs  Lab 11/10/18 0814  LIPASE 18   No results for input(s): AMMONIA in the last 168 hours. Coagulation Profile: Recent Labs  Lab 11/10/18 2010  INR 1.3*   Cardiac Enzymes: No results for input(s): CKTOTAL, CKMB, CKMBINDEX, TROPONINI in the last 168 hours. BNP (last 3 results) No results for input(s): PROBNP in the last 8760 hours. HbA1C: No results for input(s): HGBA1C in the last 72 hours. CBG: Recent Labs  Lab 11/11/18 1126  GLUCAP 109*   Lipid Profile: No results for input(s): CHOL, HDL, LDLCALC, TRIG, CHOLHDL, LDLDIRECT in the last 72 hours. Thyroid Function Tests: No results for input(s): TSH, T4TOTAL, FREET4, T3FREE, THYROIDAB in the last 72 hours. Anemia Panel: No results for input(s): VITAMINB12, FOLATE, FERRITIN, TIBC,  IRON, RETICCTPCT in the last 72 hours. Sepsis Labs: Recent Labs  Lab 11/10/18 1010 11/10/18 2010  LATICACIDVEN 5.6* 0.6    Recent Results (from the past 240 hour(s))  SARS Coronavirus 2 [facility] order, Performed in [facility] hospital lab) Nasopharyngeal Nasopharyngeal Swab     Status: None   Collection Time: 11/10/18  9:09 AM   Specimen: Nasopharyngeal Swab  Result Value Ref Range Status   SARS Coronavirus 2 NEGATIVE NEGATIVE Final    Comment: (NOTE) If result is NEGATIVE SARS-CoV-2 target nucleic acids are NOT DETECTED. The SARS-CoV-2 RNA is generally detectable in upper and lower  respiratory specimens during the acute phase of infection. The lowest  concentration of SARS-CoV-2 viral copies this assay can detect is 250  copies / mL.  A negative result does not preclude SARS-CoV-2 infection  and should not be used as the sole basis for treatment or other  patient management decisions.  A negative result may occur with  improper specimen collection / handling, submission of specimen other  than nasopharyngeal swab, presence of viral mutation(s) within the  areas targeted by this assay, and inadequate number of viral copies  (<250 copies / mL). A negative result must be combined with clinical  observations, patient history, and epidemiological information. If result is POSITIVE SARS-CoV-2 target nucleic acids are DETECTED. The SARS-CoV-2 RNA is generally detectable in upper and lower  respiratory specimens dur ing the acute phase of infection.  Positive  results are indicative of active infection with SARS-CoV-2.  Clinical  correlation with patient history and other diagnostic information is  necessary to determine patient infection status.  Positive results do  not rule out bacterial infection or co-infection with other viruses. If result is PRESUMPTIVE POSTIVE SARS-CoV-2 nucleic acids MAY BE PRESENT.   A presumptive positive result was obtained on the submitted specimen   and confirmed on repeat testing.  While 2019 novel coronavirus  (SARS-CoV-2) nucleic acids may be present in the submitted sample  additional confirmatory testing may be necessary for epidemiological  and / or clinical management purposes  to differentiate between  SARS-CoV-2 and other Sarbecovirus currently known to infect humans.  If clinically indicated additional testing with an alternate test  methodology [phone]) is advised. The SARS-CoV-2 RNA is generally  detectable in upper and lower respiratory sp ecimens during the acute  phase of infection. The expected result is Negative. Fact Sheet for Patients:  StrictlyIdeas.no Fact Sheet for Healthcare Providers: BankingDealers.co.za This test is not yet approved or cleared by the [place] FDA and has been authorized for detection and/or diagnosis of SARS-CoV-2 by FDA under an Emergency Use Authorization (EUA).  This EUA will remain in effect (meaning this test can be used) for the duration of the COVID-19 declaration under Section 564(b)(1) of the Act, 21 U.S.C. section 360bbb-3(b)(1), unless the authorization is terminated or revoked sooner. Performed at [facility], [place] [address] [place], [place] [address]   Blood culture (routine x 2)     Status: None (Preliminary result)   Collection Time: 11/10/18 10:29 AM   Specimen: BLOOD LEFT ARM  Result Value Ref Range Status   Specimen Description BLOOD LEFT ARM  Final   Special Requests   Final    BOTTLES DRAWN AEROBIC AND ANAEROBIC Blood Culture results may not be optimal due to an inadequate volume of blood received in culture bottles   Culture   Final    NO GROWTH 2 DAYS Performed at [facility], [place] [address], [place], [place] [address]    Report Status PENDING  Incomplete  Blood culture (routine x 2)     Status: None (Preliminary result)   Collection Time: 11/10/18 10:40 AM   Specimen: BLOOD LEFT WRIST   Result Value Ref Range Status   Specimen Description BLOOD LEFT WRIST  Final   Special Requests   Final    BOTTLES DRAWN AEROBIC ONLY Blood Culture results may not be optimal due to an inadequate volume of blood received in culture bottles   Culture   Final    NO GROWTH 2 DAYS Performed at [facility], [place] [address] [place], [place] [address]    Report Status PENDING  Incomplete  MRSA PCR Screening     Status: None  Collection Time: 11/10/18  2:07 PM   Specimen: Nasal Mucosa; Nasopharyngeal  Result Value Ref Range Status   MRSA by PCR NEGATIVE NEGATIVE Final    Comment:        The GeneXpert MRSA Assay (FDA approved for NASAL specimens only), is one component of a comprehensive MRSA colonization surveillance program. It is not intended to diagnose MRSA infection nor to guide or monitor treatment for MRSA infections. Performed at [facility], [place] [address], [place], [place] [address]          Radiology Studies: Dg Chest Port 1 View  Result Date: 11/11/2018 CLINICAL DATA:  GIB (gastrointestinal bleeding) EXAM: PORTABLE CHEST 1 VIEW COMPARISON:  11/10/2018 FINDINGS: RIGHT PICC line tip overlies the superior vena cava. The heart is enlarged. Aorta is tortuous and partially calcified. There is prominence of interstitial markings, stable in appearance. No overt edema. IMPRESSION: Cardiomegaly. RIGHT-sided PICC line placement.  No pneumothorax. Electronically Signed   By: Nolon Nations M.D.   On: 11/11/2018 09:13   [place] Ekg Site Rite  Result Date: 11/10/2018 If Site Rite image not attached, placement could not be confirmed due to current cardiac rhythm.       Scheduled Meds: . Chlorhexidine Gluconate Cloth  6 each Topical Daily  . pantoprazole (PROTONIX) IV  40 mg Intravenous Q24H  . sodium chloride flush  10-40 mL Intracatheter Q12H   Continuous Infusions: . sodium chloride       LOS: 2 days    Time spent: 35 mins.More than 50% of that time was  spent in counseling and/or coordination of care.      Shelly Coss, MD [facility] Pager [phone]  If 7PM-7AM, please contact night-coverage [email or web address] Password [email or web address] 11/12/2018, 10:59 AM

## 2018-11-13 DIAGNOSIS — K922 Gastrointestinal hemorrhage, unspecified: Secondary | ICD-10-CM

## 2018-11-13 LAB — CBC WITH DIFFERENTIAL/PLATELET
Abs Immature Granulocytes: 0 10*3/uL (ref 0.00–0.07)
Basophils Absolute: 0 10*3/uL (ref 0.0–0.1)
Basophils Relative: 0 %
Eosinophils Absolute: 0 10*3/uL (ref 0.0–0.5)
Eosinophils Relative: 0 %
HCT: 31.8 % — ABNORMAL LOW (ref 36.0–46.0)
Hemoglobin: 10.4 g/dL — ABNORMAL LOW (ref 12.0–15.0)
Lymphocytes Relative: 3 %
Lymphs Abs: 0.5 10*3/uL — ABNORMAL LOW (ref 0.7–4.0)
MCH: 26.7 pg (ref 26.0–34.0)
MCHC: 32.7 g/dL (ref 30.0–36.0)
MCV: 81.5 fL (ref 80.0–100.0)
Monocytes Absolute: 0.7 10*3/uL (ref 0.1–1.0)
Monocytes Relative: 4 %
Neutro Abs: 16 10*3/uL — ABNORMAL HIGH (ref 1.7–7.7)
Neutrophils Relative %: 93 %
Platelets: 129 10*3/uL — ABNORMAL LOW (ref 150–400)
RBC: 3.9 MIL/uL (ref 3.87–5.11)
RDW: 25.2 % — ABNORMAL HIGH (ref 11.5–15.5)
WBC: 17.2 10*3/uL — ABNORMAL HIGH (ref 4.0–10.5)
nRBC: 0.5 % — ABNORMAL HIGH (ref 0.0–0.2)
nRBC: 2 /100 WBC — ABNORMAL HIGH

## 2018-11-13 LAB — BASIC METABOLIC PANEL
Anion gap: 8 (ref 5–15)
BUN: 20 mg/dL (ref 8–23)
CO2: 19 mmol/L — ABNORMAL LOW (ref 22–32)
Calcium: 8.2 mg/dL — ABNORMAL LOW (ref 8.9–10.3)
Chloride: 116 mmol/L — ABNORMAL HIGH (ref 98–111)
Creatinine, Ser: 1.26 mg/dL — ABNORMAL HIGH (ref 0.44–1.00)
GFR calc Af Amer: 48 mL/min — ABNORMAL LOW (ref >60)
GFR calc non Af Amer: 41 mL/min — ABNORMAL LOW (ref >60)
Glucose, Bld: 103 mg/dL — ABNORMAL HIGH (ref 70–99)
Potassium: 3.2 mmol/L — ABNORMAL LOW (ref 3.5–5.1)
Sodium: 143 mmol/L (ref 135–145)

## 2018-11-13 MED ORDER — PANTOPRAZOLE SODIUM 40 MG PO TBEC: 40.0000 mg | DELAYED_RELEASE_TABLET | Freq: Every day | ORAL | 0 refills | Status: DC

## 2018-11-13 MED ORDER — TRAMADOL HCL 50 MG PO TABS: 50.0000 mg | ORAL_TABLET | Freq: Four times a day (QID) | ORAL | 0 refills | Status: AC | PRN

## 2018-11-13 MED FILL — PANTOPRAZOLE SOD DR 40 MG T: 40 | 30 days supply | Qty: 30 | Fill #0

## 2018-11-13 MED FILL — traMADol HCL 50 MG TABS: 50 | 5 days supply | Qty: 20 | Fill #0

## 2018-11-13 NOTE — TOC Initial Note (Signed)
Transition of Care [facility]) - Initial/Assessment Note    Patient Details  Name: Shannon Terry MRN: [id number] Date of Birth: [birth year]  [facility] CM/SW Contact:    Alexander Mt, [place] Phone Number: 11/13/2018, 10:12 AM  Clinical Narrative:                 CSW spoke with pt at bedside; introduced self, role, reason for visit. Pt from home with her spouse Purcell Nails. Pt states she uses a walker when she needs it at home but otherwise declined any other ADL equipment. We discussed her support from her daughter who is a Therapist, sports here at [employer]. She is eager to get home but does want something to manage her pain. When asked about any services that pt may be open to in the home she declines stating "I don't need that."  CSW also spoke with pt daughter Webb Silversmith outside the room, she has arranged home to take care of her mother and father (who has dementia); she is also interested in her mother coming home today. She has taken time off and will assist pt with care as needed. Pt daughter does want a wheelchair and would like it delivered to their home with her contact for delivery. Pt daughter pays for pt medications herself due to no medication coverage through their insurance. She would like to discuss pain management regimen with doctor and have the medications called to [place] with her phone number given for payment.  We discussed hospice services and pt daughter states that pt had previously refused them to come into her home and that she feels she will do that again. Pt daughter prefers her return home with her care at this time.   RNCM will assist with wheelchair order/delivery and TOC medication.   Expected Discharge Plan: Home/Self Care Barriers to Discharge: Continued Medical Work up   Patient Goals and CMS Choice Patient states their goals for this hospitalization and ongoing recovery are:: to go home CMS Medicare.gov Compare Post Acute Care list provided to:: (n/a at this time) Choice  offered to / list presented to : Patient  Expected Discharge Plan and Services Expected Discharge Plan: Home/Self Care In-house Referral: Clinical Social Work Discharge Planning Services: CM Consult Post Acute Care Choice: NA Living arrangements for the past 2 months: Single Family Home  Prior Living Arrangements/Services Living arrangements for the past 2 months: Single Family Home Lives with:: Spouse Patient language and need for interpreter reviewed:: Yes(no needs) Do you feel safe going back to the place where you live?: Yes      Need for Family Participation in Patient Care: Yes (Comment)(assist with ADL/IADLs; support/supervision) Care giver support system in place?: Yes (comment)(spouse and adult children) Current home services: DME Criminal Activity/Legal Involvement Pertinent to Current Situation/Hospitalization: No - Comment as needed  Activities of Daily Living Home Assistive Devices/Equipment: Environmental consultant (specify type), Eyeglasses ADL Screening (condition at time of admission) Patient's cognitive ability adequate to safely complete daily activities?: No Is the patient deaf or have difficulty hearing?: No Does the patient have difficulty seeing, even when wearing glasses/contacts?: Yes Does the patient have difficulty concentrating, remembering, or making decisions?: No Patient able to express need for assistance with ADLs?: Yes Does the patient have difficulty dressing or bathing?: No Independently performs ADLs?: Yes (appropriate for developmental age) Does the patient have difficulty walking or climbing stairs?: No Weakness of Legs: Both Weakness of Arms/Hands: None  Permission Sought/Granted Permission sought to share information with : Family  Supports Permission granted to share information with : Yes, Verbal Permission Granted  Share Information with NAME: Jama Mcmiller     Permission granted to share info w Relationship: daughter  Permission granted to share info w  Contact Information: [phone]  Emotional Assessment Appearance:: Appears stated age Attitude/Demeanor/Rapport: Guarded Affect (typically observed): Pleasant, Guarded, Quiet Orientation: : Oriented to Self, Oriented to Place, Oriented to  Time, Oriented to Situation Alcohol / Substance Use: Not Applicable Psych Involvement: No (comment)  Admission diagnosis:  Hypoxemia [R09.02] GIB (gastrointestinal bleeding) [K92.2] Gastrointestinal hemorrhage associated with anorectal source [K62.5] Symptomatic anemia [D64.9] Patient Active Problem List   Diagnosis Date Noted  . GIB (gastrointestinal bleeding) 11/10/2018  . Left leg weakness 11/15/2015  . History of CVA (cerebrovascular accident) 11/15/2015  . Rectal carcinoma ([place])   . Acute CVA (cerebrovascular accident) ([place]) 04/08/2015  . Hypertension 04/07/2015  . Malignant GIST ([place]) 05/30/2012  . GERD (gastroesophageal reflux disease)   . GI bleeding   . Iron deficiency anemia due to chronic blood loss 04/01/2012   PCP:  Patient, No Pcp Per Pharmacy:   [facility], [place] [address] [place] [address] Phone: [phone] Fax: [phone]     Social Determinants of Health (SDOH) Interventions    Readmission Risk Interventions No flowsheet data found.

## 2018-11-13 NOTE — Care Management (Signed)
    Durable Medical Equipment  (From admission, onward)         Start     Ordered   11/13/18 1034  For home use only DME lightweight manual wheelchair with seat cushion  Once    Comments: Patient suffers from Acute on chronic blood loss anemia/GI bleed which impairs their ability to perform daily activities like ambulating  in the home.  A cane  will not resolve  issue with performing activities of daily living. A wheelchair will allow patient to safely perform daily activities. Patient is not able to propel themselves in the home using a standard weight wheelchair due toweakness. Patient can self propel in the lightweight wheelchair. Length of need lifetime  Accessories: elevating leg rests (ELRs), wheel locks, extensions and anti-tippers.   Seat and back cushions    CALL DAUGHTER ANN [place] [phone] for payment and delivery thanks   11/13/18 1034

## 2018-11-13 NOTE — Discharge Summary (Signed)
Physician Discharge Summary  Shannon Terry GYF:[id number] DOB: 1941/02/09 DOA: 11/10/2018  PCP: Shannon Terry, No Pcp Per  Admit date: 11/10/2018 Discharge date: 11/13/2018  Admitted From: Home Disposition: Home  Recommendations for Outpatient Follow-up:  1. 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 She was transfused 4 units PRBC and hemoglobin improved from 1.8 to 8.6.She continued  to have active bleeding .Recently  hemoglobin has been stable and today she does not have bleeding. She continues to refuse EGD/colonoscopy  and hence GI consult was not called. She has been on hospice previously but after she improved, she refused to allow them into her house.  Plan is to discharge her home after bleeding stop completely.  Palliative care is meeting with family today for discussion of goals of care.  Shannon Terry met as above with remarkable anemia at 1.8, now status post transfusion currently 10.8 and stable over the past 48 hours.  She has known history of GI stromal tumor diagnosed nearly a decade ago with no real work-up or further evaluation.  Shannon Terry continues to decline any further medical management or evaluation here, refusing GI consult, palliative care or hospice.  Family at bedside today with daughter, we discussed at length the Shannon Terry will likely continue to bleed and at some point will likely need blood transfusion repeat again however given her current stable state she is otherwise stable for discharge given she continues to refuse any further work-up or evaluation.  We also recommended PCP for outpatient follow-up however Shannon Terry again declined as she feels "well" right now and does  not feel that she needs further evaluation or work-up.  Family indicates this is her typical baseline where she feels no need for medication or medical follow-up until she feels poorly at which time she reports to the hospital.  This time Shannon Terry continues to be high risk for increased morbidity mortality given her medication and medical noncompliance as well as need for further work-up for GIST tumor/evaluation of likely GI bleed but continues to refuse.Marland Kitchen  Discharge Diagnoses:  Active Problems:   GIB (gastrointestinal bleeding)   Discharge Instructions   Allergies as of 11/13/2018   No Known Allergies     Medication List    STOP taking these medications   citalopram 10 MG tablet Commonly known as: CELEXA   haloperidol 2 MG/ML solution Commonly known as: HALDOL   ibuprofen 200 MG tablet Commonly known as: ADVIL   LORazepam 2 MG/ML concentrated solution Commonly known as: ATIVAN   oxyCODONE-acetaminophen 5-325 MG tablet Commonly known as: PERCOCET/ROXICET   polyethylene glycol 17 g packet Commonly known as: MIRALAX / GLYCOLAX     TAKE these medications   pantoprazole 40 MG tablet Commonly known as: Protonix Take 1 tablet (40 mg total) by mouth daily.   traMADol 50 MG tablet Commonly known as: Ultram Take 1 tablet (50 mg total) by mouth every 6 (six) hours as needed for up to 7 days for moderate pain or severe pain.            Durable Medical Equipment  (From admission, onward)         Start     Ordered   11/13/18 1034  For home use only  DME lightweight manual wheelchair with seat cushion  Once    Comments: Shannon Terry suffers from Acute on chronic blood loss anemia/GI bleed which impairs their ability to perform daily activities like ambulating  in the home.  A cane  will not resolve  issue with performing activities of daily living. A wheelchair will allow Shannon Terry to safely perform daily activities. Shannon Terry is not able to propel themselves in the home using a  standard weight wheelchair due toweakness. Shannon Terry can self propel in the lightweight wheelchair. Length of need lifetime  Accessories: elevating leg rests (ELRs), wheel locks, extensions and anti-tippers.   Seat and back cushions    CALL DAUGHTER ANN Shannon Terry [phone] for payment and delivery thanks   11/13/18 1034          No Known Allergies   Procedures/Studies: Ct Head Wo Contrast  Result Date: 11/10/2018 CLINICAL DATA:  Altered mental status. EXAM: CT HEAD WITHOUT CONTRAST TECHNIQUE: Contiguous axial images were obtained from the base of the skull through the vertex without intravenous contrast. COMPARISON:  Brain MRI and head CT scan 11/15/2015. FINDINGS: Brain: No evidence of acute infarction, hemorrhage, hydrocephalus, extra-axial collection or mass lesion/mass effect. Remote right paramedian frontal infarct is unchanged. Vascular: Atherosclerosis. Skull: Intact.  No focal lesion. Sinuses/Orbits: Negative. Other: None. IMPRESSION: No acute abnormality. Remote right frontal infarct. Atherosclerosis. Electronically Signed   By: Inge Rise M.D.   On: 11/10/2018 09:22   Dg Chest Port 1 View  Result Date: 11/11/2018 CLINICAL DATA:  GIB (gastrointestinal bleeding) EXAM: PORTABLE CHEST 1 VIEW COMPARISON:  11/10/2018 FINDINGS: RIGHT PICC line tip overlies the superior vena cava. The heart is enlarged. Aorta is tortuous and partially calcified. There is prominence of interstitial markings, stable in appearance. No overt edema. IMPRESSION: Cardiomegaly. RIGHT-sided PICC line placement.  No pneumothorax. Electronically Signed   By: Nolon Nations M.D.   On: 11/11/2018 09:13   Dg Chest Portable 1 View  Result Date: 11/10/2018 CLINICAL DATA:  Acute onset vomiting and rectal bleeding today. EXAM: PORTABLE CHEST 1 VIEW COMPARISON:  Single-view of the chest 04/07/2015. FINDINGS: The Shannon Terry is rotated on the exam. Lungs are clear. Heart size is upper normal. No pneumothorax or pleural fluid.  No acute or focal bony abnormality. IMPRESSION: No acute disease. Electronically Signed   By: Inge Rise M.D.   On: 11/10/2018 10:14   [place] Ekg Site Rite  Result Date: 11/10/2018 If Site Rite image not attached, placement could not be confirmed due to current cardiac rhythm.   Subjective: No acute issues or events overnight   Discharge Exam: Vitals:   11/12/18 2153 11/13/18 0536  BP: (!) 149/59 123/61  Pulse: 97 99  Resp: 16   Temp: 98.4 F (36.9 C) 98.5 F (36.9 C)  SpO2: 100% 100%   Vitals:   11/12/18 1500 11/12/18 1701 11/12/18 2153 11/13/18 0536  BP: 136/75 123/83 (!) 149/59 123/61  Pulse: 88 (!) 102 97 99  Resp: 15  16   Temp: 99.8 F (37.7 C) 99.2 F (37.3 C) 98.4 F (36.9 C) 98.5 F (36.9 C)  TempSrc: Oral Oral Oral Oral  SpO2: 97% 100% 100% 100%  Weight:      Height:        General:  Pleasantly resting in bed, No acute distress. HEENT:  Normocephalic atraumatic.  Sclerae nonicteric, noninjected.  Extraocular movements intact bilaterally. Neck:  Without mass or deformity.  Trachea is midline. Lungs:  Clear to auscultate bilaterally without rhonchi, wheeze, or rales. Heart:  Regular rate and rhythm.  Without murmurs, rubs, or gallops. Abdomen:  Soft, nontender, nondistended.  Without guarding or rebound. Extremities: Without cyanosis, clubbing, edema, or obvious deformity. Vascular:  Dorsalis pedis and posterior tibial pulses palpable bilaterally. Skin:  Warm and dry, no erythema, no ulcerations.   The results of significant diagnostics from this hospitalization (including imaging, microbiology, ancillary and laboratory) are listed below for reference.     Microbiology: Recent Results (from the past 240 hour(s))  SARS Coronavirus 2 [facility] order, Performed in [facility] hospital lab) Nasopharyngeal Nasopharyngeal Swab     Status: None   Collection Time: 11/10/18  9:09 AM   Specimen: Nasopharyngeal Swab  Result Value Ref Range Status   SARS  Coronavirus 2 NEGATIVE NEGATIVE Final    Comment: (NOTE) If result is NEGATIVE SARS-CoV-2 target nucleic acids are NOT DETECTED. The SARS-CoV-2 RNA is generally detectable in upper and lower  respiratory specimens during the acute phase of infection. The lowest  concentration of SARS-CoV-2 viral copies this assay can detect is 250  copies / mL. A negative result does not preclude SARS-CoV-2 infection  and should not be used as the sole basis for treatment or other  Shannon Terry management decisions.  A negative result may occur with  improper specimen collection / handling, submission of specimen other  than nasopharyngeal swab, presence of viral mutation(s) within the  areas targeted by this assay, and inadequate number of viral copies  (<250 copies / mL). A negative result must be combined with clinical  observations, Shannon Terry history, and epidemiological information. If result is POSITIVE SARS-CoV-2 target nucleic acids are DETECTED. The SARS-CoV-2 RNA is generally detectable in upper and lower  respiratory specimens dur ing the acute phase of infection.  Positive  results are indicative of active infection with SARS-CoV-2.  Clinical  correlation with Shannon Terry history and other diagnostic information is  necessary to determine Shannon Terry infection status.  Positive results do  not rule out bacterial infection or co-infection with other viruses. If result is PRESUMPTIVE POSTIVE SARS-CoV-2 nucleic acids MAY BE PRESENT.   A presumptive positive result was obtained on the submitted specimen  and confirmed on repeat testing.  While 2019 novel coronavirus  (SARS-CoV-2) nucleic acids may be present in the submitted sample  additional confirmatory testing may be necessary for epidemiological  and / or clinical management purposes  to differentiate between  SARS-CoV-2 and other Sarbecovirus currently known to infect humans.  If clinically indicated additional testing with an alternate test   methodology [phone]) is advised. The SARS-CoV-2 RNA is generally  detectable in upper and lower respiratory sp ecimens during the acute  phase of infection. The expected result is Negative. Fact Sheet for Patients:  StrictlyIdeas.no Fact Sheet for Healthcare Providers: BankingDealers.co.za This test is not yet approved or cleared by the [place] FDA and has been authorized for detection and/or diagnosis of SARS-CoV-2 by FDA under an Emergency Use Authorization (EUA).  This EUA will remain in effect (meaning this test can be used) for the duration of the COVID-19 declaration under Section 564(b)(1) of the Act, 21 U.S.C. section 360bbb-3(b)(1), unless the authorization is terminated or revoked sooner. Performed at [facility], [place] [address], [place], [place] [address]   Blood culture (routine x 2)     Status: None (Preliminary result)   Collection Time: 11/10/18 10:29 AM   Specimen: BLOOD LEFT ARM  Result Value Ref Range Status   Specimen Description BLOOD LEFT ARM  Final   Special Requests  Final    BOTTLES DRAWN AEROBIC AND ANAEROBIC Blood Culture results may not be optimal due to an inadequate volume of blood received in culture bottles   Culture   Final    NO GROWTH 3 DAYS Performed at [facility], [place] [address], [place], [place] [address]    Report Status PENDING  Incomplete  Blood culture (routine x 2)     Status: None (Preliminary result)   Collection Time: 11/10/18 10:40 AM   Specimen: BLOOD LEFT WRIST  Result Value Ref Range Status   Specimen Description BLOOD LEFT WRIST  Final   Special Requests   Final    BOTTLES DRAWN AEROBIC ONLY Blood Culture results may not be optimal due to an inadequate volume of blood received in culture bottles   Culture   Final    NO GROWTH 3 DAYS Performed at [facility], [place] [address] [place], [place] [address]    Report Status PENDING  Incomplete  MRSA PCR  Screening     Status: None   Collection Time: 11/10/18  2:07 PM   Specimen: Nasal Mucosa; Nasopharyngeal  Result Value Ref Range Status   MRSA by PCR NEGATIVE NEGATIVE Final    Comment:        The GeneXpert MRSA Assay (FDA approved for NASAL specimens only), is one component of a comprehensive MRSA colonization surveillance program. It is not intended to diagnose MRSA infection nor to guide or monitor treatment for MRSA infections. Performed at [facility], [place] [address], [place], [place] [address]      Labs: BNP (last 3 results) No results for input(s): BNP in the last 8760 hours. Basic Metabolic Panel: Recent Labs  Lab 11/10/18 0814 11/11/18 0305 11/12/18 0330 11/13/18 0117  NA 144 145 143 143  K 4.0 3.9 3.5 3.2*  CL 120* 121* 117* 116*  CO2 15* 18* 17* 19*  GLUCOSE 180* 113* 130* 103*  BUN _0 CREATININE 1.45* 1.30* 1.34* 1.26*  CALCIUM 7.6* 8.0* 7.9* 8.2*   Liver Function Tests: Recent Labs  Lab 11/10/18 0814  AST 14*  ALT 6  ALKPHOS 67  BILITOT 0.4  PROT 4.4*  ALBUMIN 2.4*   Recent Labs  Lab 11/10/18 0814  LIPASE 18   No results for input(s): AMMONIA in the last 168 hours. CBC: Recent Labs  Lab 11/10/18 1029  11/11/18 0305 11/11/18 0826 11/11/18 1150 11/11/18 1943 11/12/18 0330 11/13/18 0117  WBC 8.5  --  16.0*  --  17.9* 19.3* 19.1* 17.2*  NEUTROABS 6.8  --   --   --   --   --   --  16.0*  HGB 1.8*   < > 8.6* 9.0* 7.3* 10.3* 10.2* 10.4*  HCT 7.8*   < > 26.0* 27.0* 22.2* 30.0* 29.9* 31.8*  MCV 54.9*  --  74.7*  --  73.3* 79.4* 80.4 81.5  PLT 67*  --  244  --  213 163 157 129*   < > = values in this interval not displayed.   Urinalysis    Component Value Date/Time   COLORURINE YELLOW 11/10/2018 1250   APPEARANCEUR CLEAR 11/10/2018 1250   LABSPEC 1.013 11/10/2018 1250   PHURINE 5.0 11/10/2018 1250   GLUCOSEU NEGATIVE 11/10/2018 1250   HGBUR NEGATIVE 11/10/2018 1250   BILIRUBINUR NEGATIVE 11/10/2018 1250   KETONESUR  NEGATIVE 11/10/2018 1250   PROTEINUR NEGATIVE 11/10/2018 1250   UROBILINOGEN 1.0 04/01/2012 0100   NITRITE NEGATIVE 11/10/2018  [place] 11/10/2018 1250   Microbiology Recent Results (from the past 240 hour(s))  SARS Coronavirus 2 [facility] order, Performed in [facility] hospital lab) Nasopharyngeal Nasopharyngeal Swab     Status: None   Collection Time: 11/10/18  9:09 AM   Specimen: Nasopharyngeal Swab  Result Value Ref Range Status   SARS Coronavirus 2 NEGATIVE NEGATIVE Final    Comment: (NOTE) If result is NEGATIVE SARS-CoV-2 target nucleic acids are NOT DETECTED. The SARS-CoV-2 RNA is generally detectable in upper and lower  respiratory specimens during the acute phase of infection. The lowest  concentration of SARS-CoV-2 viral copies this assay can detect is 250  copies / mL. A negative result does not preclude SARS-CoV-2 infection  and should not be used as the sole basis for treatment or other  Shannon Terry management decisions.  A negative result may occur with  improper specimen collection / handling, submission of specimen other  than nasopharyngeal swab, presence of viral mutation(s) within the  areas targeted by this assay, and inadequate number of viral copies  (<250 copies / mL). A negative result must be combined with clinical  observations, Shannon Terry history, and epidemiological information. If result is POSITIVE SARS-CoV-2 target nucleic acids are DETECTED. The SARS-CoV-2 RNA is generally detectable in upper and lower  respiratory specimens dur ing the acute phase of infection.  Positive  results are indicative of active infection with SARS-CoV-2.  Clinical  correlation with Shannon Terry history and other diagnostic information is  necessary to determine Shannon Terry infection status.  Positive results do  not rule out bacterial infection or co-infection with other viruses. If result is PRESUMPTIVE POSTIVE SARS-CoV-2 nucleic acids MAY BE PRESENT.   A  presumptive positive result was obtained on the submitted specimen  and confirmed on repeat testing.  While 2019 novel coronavirus  (SARS-CoV-2) nucleic acids may be present in the submitted sample  additional confirmatory testing may be necessary for epidemiological  and / or clinical management purposes  to differentiate between  SARS-CoV-2 and other Sarbecovirus currently known to infect humans.  If clinically indicated additional testing with an alternate test  methodology [phone]) is advised. The SARS-CoV-2 RNA is generally  detectable in upper and lower respiratory sp ecimens during the acute  phase of infection. The expected result is Negative. Fact Sheet for Patients:  StrictlyIdeas.no Fact Sheet for Healthcare Providers: BankingDealers.co.za This test is not yet approved or cleared by the [place] FDA and has been authorized for detection and/or diagnosis of SARS-CoV-2 by FDA under an Emergency Use Authorization (EUA).  This EUA will remain in effect (meaning this test can be used) for the duration of the COVID-19 declaration under Section 564(b)(1) of the Act, 21 U.S.C. section 360bbb-3(b)(1), unless the authorization is terminated or revoked sooner. Performed at [facility], [place] [address] [place] [address], [place], [place] [address]   Blood culture (routine x 2)     Status: None (Preliminary result)   Collection Time: 11/10/18 10:29 AM   Specimen: BLOOD LEFT ARM  Result Value Ref Range Status   Specimen Description BLOOD LEFT ARM  Final   Special Requests   Final    BOTTLES DRAWN AEROBIC AND ANAEROBIC Blood Culture results may not be optimal due to an inadequate volume of blood received in culture bottles   Culture   Final    NO GROWTH 3 DAYS Performed at [facility], [place] [address] [place], [place] [address]    Report Status PENDING  Incomplete  Blood culture (routine x 2)     Status: None (Preliminary result)    Collection Time: 11/10/18 10:40 AM   Specimen: BLOOD LEFT WRIST  Result Value Ref Range Status   Specimen Description BLOOD LEFT WRIST  Final   Special Requests   Final    BOTTLES DRAWN AEROBIC ONLY Blood Culture results may not be optimal due to an inadequate volume of blood received in culture bottles   Culture   Final    NO GROWTH 3 DAYS Performed at [facility], [place] [address], [place], [place] [address]    Report Status PENDING  Incomplete  MRSA PCR Screening     Status: None   Collection Time: 11/10/18  2:07 PM   Specimen: Nasal Mucosa; Nasopharyngeal  Result Value Ref Range Status   MRSA by PCR NEGATIVE NEGATIVE Final    Comment:        The GeneXpert MRSA Assay (FDA approved for NASAL specimens only), is one component of a comprehensive MRSA colonization surveillance program. It is not intended to diagnose MRSA infection nor to guide or monitor treatment for MRSA infections. Performed at [facility], [place] [address], [place],  [address]      Time coordinating discharge: Over 30 minutes  SIGNED:   Little Ishikawa, DO [facility] 11/13/2018, 12:13 PM Pager   If 7PM-7AM, please contact night-coverage [email or web address] Password [email or web address]

## 2018-11-15 LAB — CULTURE, BLOOD (ROUTINE X 2)
Culture: NO GROWTH
Culture: NO GROWTH

## 2019-05-11 ENCOUNTER — Encounter (HOSPITAL_COMMUNITY): Payer: Self-pay

## 2019-05-11 ENCOUNTER — Observation Stay (HOSPITAL_COMMUNITY)
Admission: EM | Admit: 2019-05-11 | Discharge: 2019-05-12 | Disposition: A | Discharge status: Home / Self Care | Payer: Medicare Other | Attending: Internal Medicine | Admitting: Internal Medicine

## 2019-05-11 ENCOUNTER — Other Ambulatory Visit: Payer: Self-pay

## 2019-05-11 DIAGNOSIS — Z800 Family history of malignant neoplasm of digestive organs: Secondary | ICD-10-CM | POA: Diagnosis not present

## 2019-05-11 DIAGNOSIS — I10 Essential (primary) hypertension: Secondary | ICD-10-CM | POA: Insufficient documentation

## 2019-05-11 DIAGNOSIS — K625 Hemorrhage of anus and rectum: Secondary | ICD-10-CM | POA: Insufficient documentation

## 2019-05-11 DIAGNOSIS — D62 Acute posthemorrhagic anemia: Principal | ICD-10-CM

## 2019-05-11 DIAGNOSIS — Z20822 Contact with and (suspected) exposure to covid-19: Secondary | ICD-10-CM | POA: Diagnosis not present

## 2019-05-11 DIAGNOSIS — C20 Malignant neoplasm of rectum: Secondary | ICD-10-CM | POA: Insufficient documentation

## 2019-05-11 DIAGNOSIS — Z66 Do not resuscitate: Secondary | ICD-10-CM | POA: Diagnosis not present

## 2019-05-11 DIAGNOSIS — D649 Anemia, unspecified: Secondary | ICD-10-CM

## 2019-05-11 DIAGNOSIS — N179 Acute kidney failure, unspecified: Secondary | ICD-10-CM | POA: Diagnosis not present

## 2019-05-11 DIAGNOSIS — K922 Gastrointestinal hemorrhage, unspecified: Secondary | ICD-10-CM

## 2019-05-11 LAB — PREPARE RBC (CROSSMATCH)

## 2019-05-11 LAB — COMPREHENSIVE METABOLIC PANEL
ALT: 7 U/L (ref 0–44)
AST: 9 U/L — ABNORMAL LOW (ref 15–41)
Albumin: 2.2 g/dL — ABNORMAL LOW (ref 3.5–5.0)
Alkaline Phosphatase: 76 U/L (ref 38–126)
Anion gap: 6 (ref 5–15)
BUN: 16 mg/dL (ref 8–23)
CO2: 18 mmol/L — ABNORMAL LOW (ref 22–32)
Calcium: 7.9 mg/dL — ABNORMAL LOW (ref 8.9–10.3)
Chloride: 119 mmol/L — ABNORMAL HIGH (ref 98–111)
Creatinine, Ser: 1.26 mg/dL — ABNORMAL HIGH (ref 0.44–1.00)
GFR calc Af Amer: 48 mL/min — ABNORMAL LOW (ref >60)
GFR calc non Af Amer: 41 mL/min — ABNORMAL LOW (ref >60)
Glucose, Bld: 98 mg/dL (ref 70–99)
Potassium: 3.9 mmol/L (ref 3.5–5.1)
Sodium: 143 mmol/L (ref 135–145)
Total Bilirubin: 0.5 mg/dL (ref 0.3–1.2)
Total Protein: 4.7 g/dL — ABNORMAL LOW (ref 6.5–8.1)

## 2019-05-11 LAB — CBC WITH DIFFERENTIAL/PLATELET
Abs Immature Granulocytes: 0.08 10*3/uL — ABNORMAL HIGH (ref 0.00–0.07)
Basophils Absolute: 0 10*3/uL (ref 0.0–0.1)
Basophils Relative: 0 %
Eosinophils Absolute: 0.1 10*3/uL (ref 0.0–0.5)
Eosinophils Relative: 2 %
HCT: 9.9 % — ABNORMAL LOW (ref 36.0–46.0)
Hemoglobin: 2.5 g/dL — CL (ref 12.0–15.0)
Immature Granulocytes: 1 %
Lymphocytes Relative: 14 %
Lymphs Abs: 1.2 10*3/uL (ref 0.7–4.0)
MCH: 13 pg — ABNORMAL LOW (ref 26.0–34.0)
MCHC: 25.3 g/dL — ABNORMAL LOW (ref 30.0–36.0)
MCV: 51.6 fL — ABNORMAL LOW (ref 80.0–100.0)
Monocytes Absolute: 1.3 10*3/uL — ABNORMAL HIGH (ref 0.1–1.0)
Monocytes Relative: 15 %
Neutro Abs: 6 10*3/uL (ref 1.7–7.7)
Neutrophils Relative %: 68 %
Platelets: 426 10*3/uL — ABNORMAL HIGH (ref 150–400)
RBC: 1.92 MIL/uL — ABNORMAL LOW (ref 3.87–5.11)
RDW: 20.3 % — ABNORMAL HIGH (ref 11.5–15.5)
WBC: 8.7 10*3/uL (ref 4.0–10.5)
nRBC: 0 % (ref 0.0–0.2)

## 2019-05-11 LAB — SARS CORONAVIRUS 2 (TAT 6-24 HRS): SARS Coronavirus 2: NEGATIVE

## 2019-05-11 LAB — PROTIME-INR
INR: 1.2 (ref 0.8–1.2)
Prothrombin Time: 15.1 seconds (ref 11.4–15.2)

## 2019-05-11 LAB — MRSA PCR SCREENING: MRSA by PCR: NEGATIVE

## 2019-05-11 LAB — POC OCCULT BLOOD, ED: Fecal Occult Bld: POSITIVE — AB

## 2019-05-11 MED ORDER — ONDANSETRON HCL 4 MG PO TABS: 4.0000 mg | ORAL_TABLET | Freq: Four times a day (QID) | ORAL | Status: DC | PRN

## 2019-05-11 MED ORDER — ACETAMINOPHEN 325 MG PO TABS
650.0000 mg | ORAL_TABLET | Freq: Four times a day (QID) | ORAL | Status: DC | PRN
  Administered 2019-05-11 – 2019-05-12 (×3): 650 mg via ORAL
  Filled 2019-05-11 (×3): qty 2

## 2019-05-11 MED ORDER — SODIUM CHLORIDE 0.9 % IV BOLUS
1000.0000 mL | Freq: Once | INTRAVENOUS | Status: AC
  Administered 2019-05-11: 1000 mL via INTRAVENOUS

## 2019-05-11 MED ORDER — ONDANSETRON HCL 4 MG/2ML IJ SOLN: 4.0000 mg | Freq: Four times a day (QID) | INTRAMUSCULAR | Status: DC | PRN

## 2019-05-11 MED ORDER — SODIUM CHLORIDE 0.9% IV SOLUTION: Freq: Once | INTRAVENOUS | Status: DC

## 2019-05-11 MED ORDER — ACETAMINOPHEN 650 MG RE SUPP: 650.0000 mg | Freq: Four times a day (QID) | RECTAL | Status: DC | PRN

## 2019-05-11 MED ORDER — CHLORHEXIDINE GLUCONATE CLOTH 2 % EX PADS: 6.0000 | MEDICATED_PAD | Freq: Every day | CUTANEOUS | Status: DC

## 2019-05-11 MED ORDER — PANTOPRAZOLE SODIUM 40 MG PO TBEC
40.0000 mg | DELAYED_RELEASE_TABLET | Freq: Every day | ORAL | Status: DC
  Administered 2019-05-12: 40 mg via ORAL
  Filled 2019-05-11: qty 1

## 2019-05-11 MED ORDER — SODIUM CHLORIDE 0.9% IV SOLUTION: Freq: Once | INTRAVENOUS | Status: AC

## 2019-05-11 NOTE — Significant Event (Addendum)
Rapid Response Event Note  Overview:Called d/t pt having BRBPR with clots. Pt here with fatigue,hgb-2.5-has a h/o anal cancer but declining treatment. Per notes, pt has had multiple hospitalizations for bleeding requiring blood transfusions. Pt has also been limiting her care to transfusions only and refusing all other interventions. Pt is a DNR. Time Called: 2003 Arrival Time: 2005 Event Type: Other (Comment)(GIB)  Initial Focused Assessment: Pt laying in bed, alert and oriented, c/o no pain, SOB, or dizziness. Pt says that she isn't dizzy because she is laying down but she has been getting dizzy when she sits up in bed. Lungs clear and diminished. Heart murmur present. Skin warm and dry. Pt has had 3 large BRB with clots bowel movements since 7pm. Pt has received 2 of 4 ordered units PRBCs. T-99.1, HR-82, BP-160/70, RR-18, SpO2-100% on RA.   Interventions: IMTS to bedside to discuss options with pt. Pt saying "I don't want it if I don't have to have it" when alternative interventions mentioned by MDs.  Will move to PCU for increased nursing care needs and closer monitoring. 3rd unit PRBCs started GI/IR consulted by IMTS Plan of Care (if not transferred): Pt is actively bleeding with hgb-2.5. Pt VSS at this time however I am worried about her decompensating quickly.  My recommendation is that the pt move to a higher level of care. This discussed with IMTS-will transfer pt to PCU.   2100-Dtr Webb Silversmith to bedside, Dtr is RN and would like MDs to consider tx to ICU for blood administration d/t pt's h/o decompensating quickly if blood not given quickly enough. MD to bedside to discuss situation with Webb Silversmith. MDs to call PCCM to see if this would be possible.   2130-Pt will be moved to 2H18.   Event Summary: Name of Physician Notified: Dr. Madilyn Fireman at 2008    at          [place], Carren Rang

## 2019-05-11 NOTE — Progress Notes (Signed)
Presented to the bedside to evaluate the patient for ongoing rectal bleeding. Patient is passing large clots per rectum. She has received 2 units pRBCs thus far and is scheduled for 2 more. She overall favors conservative care but is willing to undergo a procedure if she absolutely needs to. We discussed the case with GI, Dr. Henrene Pastor, who felt that unless we treated the underlying malignancy there would be no benefit for colonoscopy.    We will continue to transfuse the patient for symptomatic relief and transfer her to progressive. We will follow-up the 4 units with a PT/INR and fibrinogen to ensure we do not need additional blood products. I have also paged IR to see if there are any other treatment options.   Ina Homes, MD  IMTS PGY3 05/11/19 21:00pm  ADDENDUM: Paged to bedside to talk with the patient's daughter, Webb Silversmith. The patient has had similar admissions in the past and decompensated. She does not feel that she will get appropriate monitoring on a stepdown unit and is requesting transfer to the ICU. We discussed that the patient currently has capacity and does not want aggressive measures including vasoactive medications, intubation, treatment of her rectal cancer, ect. The daughter agrees with the patient's goals but would like her in the ICU. I have discussed the case with PCCM and will transfer her to the ICU  Ina Homes, MD  [facility] PGY3  05/11/19 21:25pm

## 2019-05-11 NOTE — Consult Note (Signed)
NAME:  Shannon Terry, MRN:  [id number], DOB:  [birth year], LOS: 0 ADMISSION DATE:  05/11/2019, CONSULTATION DATE:  05/11/19 REFERRING MD:  Vonna Kotyk, CHIEF COMPLAINT:  GI bleed  Brief History   This is a 79 yo with history of GIST tumor with recurrent rectal bleeding. Presented with hgb of 2.5. Daughter requesting ICU transfer as patient previously decompensated very quickly  History of present illness   This is a 79 yo with history of GIST tumor with recurrent rectal bleeding. Presented with hgb of 2.5. Daughter requesting ICU transfer even though patient would not want escalation of care given how quickly patient decompensated on the floor previously. On questioning patient GI bleed happens intermittently and has been happening for years. She will intermittently have black and tarry stool. Will sometimes also have bloody stools. She has declined treatment for her GIST in the past which has been presents for close to a decade. No chest pain. No fevers. Positive for pain with wiping as she does have fungated mass from the bottom. Currenlty patient is DNR and DNI. Would be okay with pressors if it should be needed to save her life. Would also be okay with central line if needed as well.   Past Medical History  Gastrointestinal stromal tumor   Iron deficiency anemia Recurrent GI bleeding -in the setting of GIST tumor GERD   Significant Hospital Events   Admitted on 05/11/19  Consults:  PCCM GI consulted and did not recommend intervnetions  Procedures:  None at this time.  Significant Diagnostic Tests:  Hgb 2.5  Micro Data:  NA  Antimicrobials:  NA   Interim history/subjective:  Admitted oit the ICU on the eve of 05/11/19  Objective   Blood pressure (!) 154/81, pulse 88, temperature 99 F (37.2 C), temperature source Oral, resp. rate 16, height 5\' 4"  (1.626 m), weight 68 kg, SpO2 98 %.        Intake/Output Summary (Last 24 hours) at 05/11/2019 2137 Last data filed at 05/11/2019  2009 Gross per 24 hour  Intake 831.67 ml  Output --  Net 831.67 ml   Filed Weights   05/11/19 1116 05/11/19 2111  Weight: 67 kg 68 kg    Examination: General: Alerty and oriented x3. Pleasant to speak with HENT: Moist mucous membranes Lungs: CTAB Cardiovascular: RRR Abdomen: Soft NT/ND Extremities: Moves all spontaneously.No edema noted.  Neuro: GCS 15 GU: Foley in place. There is fungating mass. Protruding through the rectum.  Resolved Hospital Problem list   NA  Assessment & Plan:   Acute on Chronic Blood Loss Anemia - BI bleed in setting of GIST tumor that has been untreated for over 10 years. Patient would not want contrasted study or IR intervention. Have attempted to relay that it might could help stop bleeding. She doe snot want this if she does not have to have it.  GI consulted and does not feel colonoscopy would be helpful  H/H Q6 Continue home PPI. No need for IV or BID as this is not a ulcer Transfuse as appropriate per guidelines  O2 as needed to support sats > 90% Family / patient okay with central line if needed. However pressures have been fine so this will hopefully not be an issue.   GIST Tumor -not on therapy. Has declined multiple times. GI has consulted ont he floor. No colonoscopy at this time as there would be no benefit if underlying malignancy is not treated. HA GIB Hopeful this will resolve spontaneously as she does  not want invasive measures.   Acute Kidney Injury -Patient with elevated creatinine however it seems it has never gone to previous baseline of 0.8 since last admission on 9/20 -in setting of blood loss anemia / relative hypoperfusion  P: Trend BMP / urinary output Replace electrolytes as indicated Avoid nephrotoxic agents, ensure adequate renal perfusion       Best practice:  Diet: Liquids for now especially in light of the fact that she would not be candidate for intervention Pain/Anxiety/Delirium protocol (if indicated):  NA VAP protocol (if indicated): NA DVT prophylaxis: Contraindicated GI prophylaxis: Protonix Glucose control: TBD Mobility: TBD Code Status: DNR Family Communication: Have spoken with patient and daughter is working in hospital tonight but previously has spooken with floor team Disposition: ICU  Labs   CBC: Recent Labs  Lab 05/11/19 1137  WBC 8.7  NEUTROABS 6.0  HGB 2.5*  HCT 9.9*  MCV 51.6*  PLT 426*    Basic Metabolic Panel: Recent Labs  Lab 05/11/19 1137  NA 143  K 3.9  CL 119*  CO2 18*  GLUCOSE 98  BUN 16  CREATININE 1.26*  CALCIUM 7.9*   GFR: Estimated Creatinine Clearance: 36 mL/min (A) (by C-G formula based on SCr of 1.26 mg/dL (H)). Recent Labs  Lab 05/11/19 1137  WBC 8.7    Liver Function Tests: Recent Labs  Lab 05/11/19 1137  AST 9*  ALT 7  ALKPHOS 76  BILITOT 0.5  PROT 4.7*  ALBUMIN 2.2*   No results for input(s): LIPASE, AMYLASE in the last 168 hours. No results for input(s): AMMONIA in the last 168 hours.  ABG    Component Value Date/Time   PHART 7.356 04/01/2012 0418   PCO2ART 48.3 (H) 04/01/2012 0418   PO2ART 32.0 (LL) 04/01/2012 0418   HCO3 27.0 (H) 04/01/2012 0418   TCO2 23 04/07/2015 1404   O2SAT 59.0 04/01/2012 0418     Coagulation Profile: Recent Labs  Lab 05/11/19 1137  INR 1.2    Cardiac Enzymes: No results for input(s): CKTOTAL, CKMB, CKMBINDEX, TROPONINI in the last 168 hours.  HbA1C: Hgb A1c MFr Bld  Date/Time Value Ref Range Status  04/08/2015 04:49 PM 5.4 4.8 - 5.6 % Final    Comment:    (NOTE)         Pre-diabetes: 5.7 - 6.4         Diabetes: >6.4         Glycemic control for adults with diabetes: <7.0     CBG: No results for input(s): GLUCAP in the last 168 hours.  Review of Systems:   Pertinent positive and negative per HPI. Unless otherwise stated they are negative.  Past Medical History  She,  has a past medical history of Acute renal failure ([place]), Anasarca, Anemia, iron deficiency,  Gastritis without bleeding, GERD (gastroesophageal reflux disease), GI bleeding, Hematochezia, Hemorrhoids, History of blood transfusion (04/01/12), Iron deficiency anemia due to chronic blood loss (04/05/12), Malignant GIST ([place]) (05/30/2012), Obesity, and Rectal cancer ([place]) (04/03/12).   Surgical History    Past Surgical History:  Procedure Laterality Date  . ABDOMINAL HYSTERECTOMY     Partial  . BIOPSY STOMACH  04/03/12   neg for malignancy  . COLONOSCOPY  04/03/2012   Procedure: COLONOSCOPY;  Surgeon: Jerene Bears, MD;  Location: [facility];  Service: Gastroenterology;  Laterality: N/A;  . ESOPHAGOGASTRODUODENOSCOPY  04/03/2012   Procedure: ESOPHAGOGASTRODUODENOSCOPY (EGD);  Surgeon: Jerene Bears, MD;  Location: [facility];  Service: Gastroenterology;  Laterality: N/A;  .  EUS  04/08/2012   Procedure: LOWER ENDOSCOPIC ULTRASOUND (EUS);  Surgeon: Milus Banister, MD;  Location: [facility];  Service: Endoscopy;  Laterality: N/A;  . RECTAL BIOPSY  04/03/12   gastrointestinal stromal tumor     Social History   reports that she has never smoked. She has never used smokeless tobacco. She reports that she does not drink alcohol or use drugs.   Family History   Her family history includes Diabetes Mellitus II in her father; Heart attack in her brother; Leukemia in her brother; Pancreatic cancer in her sister.   Allergies No Known Allergies   Home Medications  Prior to Admission medications   Medication Sig Start Date End Date Taking? Authorizing Provider  pantoprazole (PROTONIX) 40 MG tablet Take 1 tablet (40 mg total) by mouth daily. 11/13/18 12/13/18  Little Ishikawa, MD     Critical care time: 54

## 2019-05-11 NOTE — ED Triage Notes (Signed)
Pt brought to ED by EMS from home with c/o rectal bleeding described as dark red blood with clots that started last night. HX of rectal cancer. Denies dizziness, pain. No blood thinner use. Currently A&O x4.

## 2019-05-11 NOTE — ED Provider Notes (Signed)
[facility] EMERGENCY DEPARTMENT Provider Note   CSN: [id number] Arrival date & time: 05/11/19  1057     History Chief Complaint  Patient presents with  . Rectal Bleeding    Shannon Terry is a 79 y.o. female.  79 year old female brought in by EMS from home with report of rectal bleeding.  Patient has a history of rectal cancer, also tumor in her stomach, states that she has blood per rectum on occasion but does not want further treatment for this.  Patient states that when she has an increase in bleeding or is feeling unwell, she typically needs a transfusion.  Patient reports increasing bleeding last night, described as dark red with clots, states that she slept on the recliner last night and every time she got up she had blood from her rectum.  Patient reports feeling lightheaded when she stands, denies any syncopal episodes, palpitations, shortness of breath.  Patient is agreeable to transfusion today if needed, does not want any further treatment in regards to colonoscopy or endoscopy or imaging.        Past Medical History:  Diagnosis Date  . Acute renal failure ([place])   . Anasarca   . Anemia, iron deficiency    chronic blood loss  . Gastritis without bleeding   . GERD (gastroesophageal reflux disease)   . GI bleeding    hx of  . Hematochezia    Prior to Diagniosis  . Hemorrhoids   . History of blood transfusion 04/01/12   Hgb. 2.9/ 6 Units of PRBC  . Iron deficiency anemia due to chronic blood loss 04/05/12  . Malignant GIST ([place]) 05/30/2012  . Obesity    BMI 42 Jan 2014.   Marland Kitchen Rectal cancer ([place]) 04/03/12   gastrointestinal stromal tumor    Patient Active Problem List   Diagnosis Date Noted  . GIB (gastrointestinal bleeding) 11/10/2018  . Left leg weakness 11/15/2015  . History of CVA (cerebrovascular accident) 11/15/2015  . Rectal carcinoma ([place])   . Acute CVA (cerebrovascular accident) ([place]) 04/08/2015  . Hypertension 04/07/2015  . Malignant GIST  ([place]) 05/30/2012  . GERD (gastroesophageal reflux disease)   . GI bleeding   . Iron deficiency anemia due to chronic blood loss 04/01/2012    Past Surgical History:  Procedure Laterality Date  . ABDOMINAL HYSTERECTOMY     Partial  . BIOPSY STOMACH  04/03/12   neg for malignancy  . COLONOSCOPY  04/03/2012   Procedure: COLONOSCOPY;  Surgeon: Jerene Bears, MD;  Location: [facility];  Service: Gastroenterology;  Laterality: N/A;  . ESOPHAGOGASTRODUODENOSCOPY  04/03/2012   Procedure: ESOPHAGOGASTRODUODENOSCOPY (EGD);  Surgeon: Jerene Bears, MD;  Location: [facility];  Service: Gastroenterology;  Laterality: N/A;  . EUS  04/08/2012   Procedure: LOWER ENDOSCOPIC ULTRASOUND (EUS);  Surgeon: Milus Banister, MD;  Location: [facility];  Service: Endoscopy;  Laterality: N/A;  . RECTAL BIOPSY  04/03/12   gastrointestinal stromal tumor     OB History   No obstetric history on file.    Obstetric Comments  No HRT        Family History  Problem Relation Age of Onset  . Diabetes Mellitus II Father        died from stroke  . Leukemia Brother   . Pancreatic cancer Sister   . Heart attack Brother     Social History   Tobacco Use  . Smoking status: Never Smoker  . Smokeless tobacco: Never Used  . Tobacco comment: never  used tobacco  Substance Use Topics  . Alcohol use: No  . Drug use: No    Home Medications Prior to Admission medications   Medication Sig Start Date End Date Taking? Authorizing Provider  pantoprazole (PROTONIX) 40 MG tablet Take 1 tablet (40 mg total) by mouth daily. 11/13/18 12/13/18  Little Ishikawa, MD    Allergies    Patient has no known allergies.  Review of Systems   Review of Systems  Constitutional: Negative for fever.  Respiratory: Negative for shortness of breath.   Cardiovascular: Negative for chest pain.  Gastrointestinal: Positive for anal bleeding. Negative for abdominal pain and vomiting.  Skin: Negative for wound.  Neurological: Positive  for light-headedness. Negative for weakness.  All other systems reviewed and are negative.   Physical Exam Updated Vital Signs BP (!) 114/56   Pulse 82   Temp 98.5 F (36.9 C) (Oral)   Resp (!) 24   Ht 5\' 4"  (1.626 m)   Wt 67 kg   SpO2 100%   BMI 25.35 kg/m   Physical Exam Vitals and nursing note reviewed. Exam conducted with a chaperone present.  Constitutional:      General: She is not in acute distress.    Appearance: She is well-developed. She is not diaphoretic.  HENT:     Head: Normocephalic and atraumatic.  Cardiovascular:     Rate and Rhythm: Normal rate and regular rhythm.     Pulses: Normal pulses.     Heart sounds: Normal heart sounds.  Pulmonary:     Effort: Pulmonary effort is normal.     Breath sounds: Normal breath sounds.  Abdominal:     Tenderness: There is no abdominal tenderness.  Genitourinary:    Rectum: Mass present.     Comments: Pink/brown mucus present. Musculoskeletal:     Right lower leg: No edema.     Left lower leg: No edema.  Skin:    General: Skin is warm and dry.     Findings: No erythema or rash.  Neurological:     Mental Status: She is alert and oriented to person, place, and time.  Psychiatric:        Behavior: Behavior normal.     ED Results / Procedures / Treatments   Labs (all labs ordered are listed, but only abnormal results are displayed) Labs Reviewed  COMPREHENSIVE METABOLIC PANEL - Abnormal; Notable for the following components:      Result Value   Chloride 119 (*)    CO2 18 (*)    Creatinine, Ser 1.26 (*)    Calcium 7.9 (*)    Total Protein 4.7 (*)    Albumin 2.2 (*)    AST 9 (*)    GFR calc non Af Amer 41 (*)    GFR calc Af Amer 48 (*)    All other components within normal limits  CBC WITH DIFFERENTIAL/PLATELET - Abnormal; Notable for the following components:   RBC 1.92 (*)    Hemoglobin 2.5 (*)    HCT 9.9 (*)    MCV 51.6 (*)    MCH 13.0 (*)    MCHC 25.3 (*)    RDW 20.3 (*)    Platelets 426 (*)     Monocytes Absolute 1.3 (*)    Abs Immature Granulocytes 0.08 (*)    All other components within normal limits  POC OCCULT BLOOD, ED - Abnormal; Notable for the following components:   Fecal Occult Bld POSITIVE (*)    All other components  within normal limits  SARS CORONAVIRUS 2 (TAT 6-24 HRS)  PROTIME-INR  TYPE AND SCREEN  PREPARE RBC (CROSSMATCH)    EKG None  Radiology No results found.  Procedures .Critical Care Performed by: Tacy Learn, PA-C Authorized by: Tacy Learn, PA-C   Critical care provider statement:    Critical care time (minutes):  45   Critical care was time spent personally by me on the following activities:  Discussions with consultants, evaluation of patient's response to treatment, examination of patient, ordering and performing treatments and interventions, ordering and review of laboratory studies, ordering and review of radiographic studies, pulse oximetry, re-evaluation of patient's condition, obtaining history from patient or surrogate and review of old charts   (including critical care time)  Medications Ordered in ED Medications  0.9 %  sodium chloride infusion (Manually program via Guardrails IV Fluids) (has no administration in time range)  sodium chloride 0.9 % bolus 1,000 mL (1,000 mLs Intravenous New Bag/Given 05/11/19 1230)    ED Course  I have reviewed the triage vital signs and the nursing notes.  Pertinent labs & imaging results that were available during my care of the patient were reviewed by me and considered in my medical decision making (see chart for details).  Clinical Course as of May 11 1323  Sun May 11, 7350  5654 79 year old female with history of rectal cancer as well as abdominal tumor presents with worsening of her GI bleed since last night and feeling lightheaded today.  On arrival, patient is otherwise well-appearing alert, vital signs are stable.  Rectal exam shows a rectal mass, pink mucus tinged sample is Hemoccult  positive.  Patient's abdomen is firm and nontender. Patient states that she would not want CPR or any heroic measures, states that she does not want endoscopy or colonoscopy, states that she has been through this several times before and is only here today for a blood transfusion if necessary. Patient's hemoglobin today is 2.5, hematocrit of 9.9.  4 units of packed red blood cells ordered for transfusion of 2 units at this time.  Case discussed with hospitalist who will consult for admission, understands patient is only seeking palliative treatment at this time.   [LM]    Clinical Course User Index [LM] Roque Lias   MDM Rules/Calculators/A&P                      Final Clinical Impression(s) / ED Diagnoses Final diagnoses:  Acute GI bleeding  Anemia, unspecified type    Rx / DC Orders ED Discharge Orders    None       Tacy Learn, PA-C 05/12/19 4818    Sherwood Gambler, MD 05/15/19 [phone]

## 2019-05-11 NOTE — H&P (Addendum)
NAME:  Shannon Terry, MRN:  [id number], DOB:  [birth year], LOS: 0 ADMISSION DATE:  05/11/2019, Primary: Patient, No Pcp Per  CHIEF COMPLAINT:  Rectal bleeding  Medical Service: Internal Medicine Teaching Service         Attending Physician: Dr. Lucious Groves, DO    First Contact: Dr. Darrick Meigs Pager: [phone]  Second Contact: Dr. Sharon Seller Pager: [phone]       After Hours (After 5p/  First Contact Pager: [phone]  weekends / holidays): Second Contact Pager: [phone]    History of present illness   79 yo female with anal cancer who has elected to forgo treatment who is presenting with progressive fatigue and malaise over the past couple of days. History taking is limited by patient's cooperation however she notes that she does have episodic bleeding episodes that require hospitalization for blood transfusions. Upon speaking with her daughter, it sounds like she had a similar event last September and then again feb 2021. Endorses anal pain but no abdominal pain. Patient notes that she does not need prescription medicines and only takes OTC medications at home to manage pain however would not go into detail of what she actually takes at home. Patient and chart review indicate that she has essentially refused all management options offered to her including but not limited to palliative care, PCP establishment. hbg in the ED found to be 2.5. Suprisingly, not her lowest hgb which was 1.8. Discussion regarding outpatient management was attempted however patient had no interest in having periodic hgb checks as an outpatient and receiving transfusions at infusion center.    Past Medical History  She,  has a past medical history of Acute renal failure ([place]), Anasarca, Anemia, iron deficiency, Gastritis without bleeding, GERD (gastroesophageal reflux disease), GI bleeding, Hematochezia, Hemorrhoids, History of blood transfusion (04/01/12), Iron deficiency anemia due to chronic blood loss (04/05/12), Malignant  GIST ([place]) (05/30/2012), Obesity, and Rectal cancer ([place]) (04/03/12).   Home Medications     Prior to Admission medications   Medication Sig Start Date End Date Taking? Authorizing Provider  pantoprazole (PROTONIX) 40 MG tablet Take 1 tablet (40 mg total) by mouth daily. 11/13/18 12/13/18  Little Ishikawa, MD    Allergies    Allergies as of 05/11/2019   (No Known Allergies)    Social History   reports that she has never smoked. She has never used smokeless tobacco. She reports that she does not drink alcohol or use drugs.   Family History   Her family history includes Diabetes Mellitus II in her father; Heart attack in her brother; Leukemia in her brother; Pancreatic cancer in her sister.   ROS  Review of Systems  Unable to perform ROS: Acuity of condition    Objective   Blood pressure 125/64, pulse 80, temperature 98.2 F (36.8 C), temperature source Oral, resp. rate 14, height 5\' 4"  (1.626 m), weight 67 kg, SpO2 100 %.    Filed Weights   05/11/19 1116  Weight: 67 kg    Examination: GENERAL: in no acute distress HEENT: patent airway CARDIAC: heart RRR.  PULMONARY: in no acute respiratory distress ABDOMEN: bowel sounds active. NEURO: CN II-XII grossly intact. SKIN: no rash or lesions on limited exam  PSYCH: anxious affect  Labs    CBC Latest Ref Rng & Units 05/11/2019 11/13/2018 11/12/2018  WBC 4.0 - 10.5 K/uL 8.7 17.2(H) 19.1(H)  Hemoglobin 12.0 - 15.0 g/dL 2.5(LL) 10.4(L) 10.2(L)  Hematocrit 36.0 - 46.0 % 9.9(L) 31.8(L) 29.9(L)  Platelets 150 -  400 K/uL 426(H) 129(L) 157   BMP Latest Ref Rng & Units 05/11/2019 11/13/2018 11/12/2018  Glucose 70 - 99 mg/dL 98 103(H) 130(H)  BUN 8 - 23 mg/dL 16 20 20   Creatinine 0.44 - 1.00 mg/dL 1.26(H) 1.26(H) 1.34(H)  Sodium 135 - 145 mmol/L 143 143 143  Potassium 3.5 - 5.1 mmol/L 3.9 3.2(L) 3.5  Chloride 98 - 111 mmol/L 119(H) 116(H) 117(H)  CO2 22 - 32 mmol/L 18(L) 19(L) 17(L)  Calcium 8.9 - 10.3 mg/dL 7.9(L) 8.2(L) 7.9(L)      Summary  79 yo female with metastatic rectal cancer resulting in recurrent rectal bleeding requiring blood transfusions. She is presenting today for rectal bleeding and found to have severe anemia with a hgb of 2.2.  Assessment & Plan:  Active Problems:   GI bleed  Acute blood loss anemia 2/2 Rectal cancer with recurrent bleeding requiring blood transfusions. Overall poor prognosis, clearly, given untreated rectal cancer. Although the bleeding episodes requiring hospitalization have only occurred twice, I suspect that these episodes will become more frequent with disease progression and she will require longer hospitalizations for transfusions if she does not elect to have her hgb monitored on an outpatient basis.  Additionally, as she has relayed the importance of limiting medical care to the transfusions and not wishing for other interventions including medications, I think it is important to be cognizant of transfusion goals, assuming bleeding stops. I think transfusing to an asymptomatic goal would be more appropriate than transfusion to a number. I would continue to encourage her to consider directing more outpatient management of this rather than waiting to come to the hospital when her hgb is dangerously low.  Plan Transfuse PRBCs  Best practice:  CODE STATUS: DNR Diet: REGULAR DVT for prophylaxis: lovenox Social considerations/Family communication: spoke with the daughter regarding outpatient management options which she seemed to be excited about. She agrees to talk further about this to her mother later on Dispo: Admit patient to Observation with expected length of stay less than 2 midnights.   Mitzi Hansen, MD INTERNAL MEDICINE RESIDENT PGY-1 Pager # [phone] 05/11/19  2:49 PM

## 2019-05-11 NOTE — ED Notes (Signed)
Sheryl cheek- daughter- would like to be updated- [phone]

## 2019-05-11 NOTE — Progress Notes (Signed)
Pt is being transferred to 2H18 at this time due to HGB of 2.5 and requiring a higher level of care. Vitals are WNL. Report given to [facility] RN.   Eleanora Neighbor, RN

## 2019-05-11 NOTE — Progress Notes (Addendum)
New Admission Note: Patient admitted from the [facility] ED  Arrival Method: via stretcher Mental Orientation: A and O x 4 Telemetry: discontinued Assessment: Completed Skin: intact, rectal malignancy present IV: R fa and RAC infusing 1 unit PRBC's Pain: Denies Tubes: N/A Safety Measures: Safety Fall Prevention Plan has been given, discussed and signed Admission: Completed [facility] Orientation: Patient has been orientated to the room, unit and staff.  Family: None at bedside  Orders have been reviewed and implemented. Will continue to monitor the patient. Call light has been placed within reach and bed alarm has been activated.   Dorthey Sawyer, RN

## 2019-05-11 NOTE — ED Notes (Signed)
Critical hgb 2.5 reported to [place], [place].

## 2019-05-11 NOTE — Progress Notes (Signed)
RN just paged MD on call again regarding pt actively bleeding large amounts of pure bright red blood and large clots. Pt cannot stop going. Pt is finishing her second bag of blood and still has two more to go. RN informed MD on call that pt may need higher level of care due to HGB of 2.5 and still actively bleeding. Vitals are WNL. Will continue to monitor. MD on call is on way to come assess pt.   Eleanora Neighbor, RN

## 2019-05-12 DIAGNOSIS — C20 Malignant neoplasm of rectum: Secondary | ICD-10-CM

## 2019-05-12 DIAGNOSIS — K625 Hemorrhage of anus and rectum: Secondary | ICD-10-CM | POA: Diagnosis not present

## 2019-05-12 DIAGNOSIS — D630 Anemia in neoplastic disease: Secondary | ICD-10-CM

## 2019-05-12 DIAGNOSIS — Z66 Do not resuscitate: Secondary | ICD-10-CM

## 2019-05-12 DIAGNOSIS — Z9889 Other specified postprocedural states: Secondary | ICD-10-CM

## 2019-05-12 LAB — BPAM RBC
Blood Product Expiration Date: 202103152359
Blood Product Expiration Date: 202103302359
Blood Product Expiration Date: 202103302359
Blood Product Expiration Date: 202103302359
ISSUE DATE / TIME: 202103071351
ISSUE DATE / TIME: 202103071806
ISSUE DATE / TIME: 202103072047
ISSUE DATE / TIME: 202103072311
Unit Type and Rh: 7300
Unit Type and Rh: 7300
Unit Type and Rh: 7300
Unit Type and Rh: 7300

## 2019-05-12 LAB — CBC
HCT: 27.8 % — ABNORMAL LOW (ref 36.0–46.0)
HCT: 31.6 % — ABNORMAL LOW (ref 36.0–46.0)
Hemoglobin: 9.2 g/dL — ABNORMAL LOW (ref 12.0–15.0)
Hemoglobin: 10.6 g/dL — ABNORMAL LOW (ref 12.0–15.0)
MCH: 24.1 pg — ABNORMAL LOW (ref 26.0–34.0)
MCH: 24.4 pg — ABNORMAL LOW (ref 26.0–34.0)
MCHC: 33.1 g/dL (ref 30.0–36.0)
MCHC: 33.5 g/dL (ref 30.0–36.0)
MCV: 72.8 fL — ABNORMAL LOW (ref 80.0–100.0)
MCV: 72.8 fL — ABNORMAL LOW (ref 80.0–100.0)
Platelets: 367 10*3/uL (ref 150–400)
Platelets: 387 10*3/uL (ref 150–400)
RBC: 3.82 MIL/uL — ABNORMAL LOW (ref 3.87–5.11)
RBC: 4.34 MIL/uL (ref 3.87–5.11)
WBC: 12.9 10*3/uL — ABNORMAL HIGH (ref 4.0–10.5)
WBC: 15.8 10*3/uL — ABNORMAL HIGH (ref 4.0–10.5)
nRBC: 0.2 % (ref 0.0–0.2)
nRBC: 0.3 % — ABNORMAL HIGH (ref 0.0–0.2)

## 2019-05-12 LAB — COMPREHENSIVE METABOLIC PANEL
ALT: 8 U/L (ref 0–44)
AST: 12 U/L — ABNORMAL LOW (ref 15–41)
Albumin: 2.3 g/dL — ABNORMAL LOW (ref 3.5–5.0)
Alkaline Phosphatase: 77 U/L (ref 38–126)
Anion gap: 7 (ref 5–15)
BUN: 16 mg/dL (ref 8–23)
CO2: 17 mmol/L — ABNORMAL LOW (ref 22–32)
Calcium: 8 mg/dL — ABNORMAL LOW (ref 8.9–10.3)
Chloride: 118 mmol/L — ABNORMAL HIGH (ref 98–111)
Creatinine, Ser: 1.21 mg/dL — ABNORMAL HIGH (ref 0.44–1.00)
GFR calc Af Amer: 50 mL/min — ABNORMAL LOW (ref >60)
GFR calc non Af Amer: 43 mL/min — ABNORMAL LOW (ref >60)
Glucose, Bld: 91 mg/dL (ref 70–99)
Potassium: 3.9 mmol/L (ref 3.5–5.1)
Sodium: 142 mmol/L (ref 135–145)
Total Bilirubin: 1.6 mg/dL — ABNORMAL HIGH (ref 0.3–1.2)
Total Protein: 4.8 g/dL — ABNORMAL LOW (ref 6.5–8.1)

## 2019-05-12 LAB — TYPE AND SCREEN
ABO/RH(D): B POS
Antibody Screen: NEGATIVE
Unit division: 0
Unit division: 0
Unit division: 0
Unit division: 0

## 2019-05-12 LAB — LACTIC ACID, PLASMA
Lactic Acid, Venous: 0.8 mmol/L (ref 0.5–1.9)
Lactic Acid, Venous: 0.9 mmol/L (ref 0.5–1.9)

## 2019-05-12 LAB — DIC (DISSEMINATED INTRAVASCULAR COAGULATION)PANEL
D-Dimer, Quant: 1.01 ug/mL-FEU — ABNORMAL HIGH (ref 0.00–0.50)
Fibrinogen: 315 mg/dL (ref 210–475)
INR: 1.3 — ABNORMAL HIGH (ref 0.8–1.2)
Platelets: 384 10*3/uL (ref 150–400)
Prothrombin Time: 15.6 seconds — ABNORMAL HIGH (ref 11.4–15.2)
aPTT: 30 seconds (ref 24–36)

## 2019-05-12 LAB — GLUCOSE, CAPILLARY: Glucose-Capillary: 85 mg/dL (ref 70–99)

## 2019-05-12 LAB — PROTIME-INR
INR: 1.2 (ref 0.8–1.2)
Prothrombin Time: 15.5 seconds — ABNORMAL HIGH (ref 11.4–15.2)

## 2019-05-12 LAB — MAGNESIUM: Magnesium: 2 mg/dL (ref 1.7–2.4)

## 2019-05-12 LAB — APTT: aPTT: 30 seconds (ref 24–36)

## 2019-05-12 LAB — FIBRINOGEN: Fibrinogen: 323 mg/dL (ref 210–475)

## 2019-05-12 MED ORDER — HYDRALAZINE HCL 20 MG/ML IJ SOLN
10.0000 mg | INTRAMUSCULAR | Status: DC | PRN
  Administered 2019-05-12: 10 mg via INTRAVENOUS
  Filled 2019-05-12: qty 1

## 2019-05-12 NOTE — Plan of Care (Signed)

## 2019-05-12 NOTE — Progress Notes (Signed)
   NAME:  Kylar Speelman, MRN:  [id number], DOB:  26-May-1940, LOS: 0 ADMISSION DATE:  05/11/2019  Subjective  Transferred to ICU overnight due to rectal bleeding. Looking and feeling a lot better this morning and ready to go home.  Objective   Blood pressure (!) 175/103, pulse 92, temperature (!) 97.5 F (36.4 C), temperature source Oral, resp. rate (!) 28, height 5\' 4"  (1.626 m), weight 66.1 kg, SpO2 100 %.     Intake/Output Summary (Last 24 hours) at 05/12/2019 0702 Last data filed at 05/12/2019 0102 Gross per 24 hour  Intake 1954.67 ml  Output --  Net 1954.67 ml   Filed Weights   05/11/19 1116 05/11/19 2111 05/12/19 0600  Weight: 67 kg 68 kg 66.1 kg    Examination: GENERAL: in no acute distress CARDIAC: heart RRR.  PULMONARY: Lung sounds clear to auscultation. ABDOMEN: bs active  Labs    CBC Latest Ref Rng & Units 05/12/2019 05/12/2019 05/11/2019  WBC 4.0 - 10.5 K/uL - 12.9(H) 8.7  Hemoglobin 12.0 - 15.0 g/dL - 9.2(L) 2.5(LL)  Hematocrit 36.0 - 46.0 % - 27.8(L) 9.9(L)  Platelets 150 - 400 K/uL 384 367 426(H)   BMP Latest Ref Rng & Units 05/12/2019 05/11/2019 11/13/2018  Glucose 70 - 99 mg/dL 91 98 103(H)  BUN 8 - 23 mg/dL 16 16 20   Creatinine 0.44 - 1.00 mg/dL 1.21(H) 1.26(H) 1.26(H)  Sodium 135 - 145 mmol/L 142 143 143  Potassium 3.5 - 5.1 mmol/L 3.9 3.9 3.2(L)  Chloride 98 - 111 mmol/L 118(H) 119(H) 116(H)  CO2 22 - 32 mmol/L 17(L) 18(L) 19(L)  Calcium 8.9 - 10.3 mg/dL 8.0(L) 7.9(L) 8.2(L)    Summary  79 yo female with rectal cancer and subsequent rectal bleeding presenting with symptomatic anemia.  Assessment & Plan:  Active Problems:   GI bleed  Blood loss anemia.  Secondary to rectal cancer. Underwent treatment in 2014 however has since decided to forgo further treatment. hgb 2.5 on admission. Received 3U PRBC transfusion. hgb 9.2 this morning. No further rectal bleeding this morning.  Due to her reluctance toward further medical treatment, stable for discharge. Patient  offered referral to GI and palliative for further evaluation and management however patient does not want this at this time.  Best practice:  CODE STATUS: DNR Diet: REG DVT for prophylaxis: scd Dispo: discharging today  Mitzi Hansen, MD [facility] PGY-1 PAGER #: [phone] 05/12/19  7:02 AM

## 2019-05-12 NOTE — Discharge Summary (Signed)
   Name: Shannon Terry MRN: [id number] DOB: 1941-03-01 79 y.o. PCP: Patient, No Pcp Per  Date of Admission: 05/11/2019 10:57 AM Date of Discharge:  Attending Physician: Lucious Groves, DO  Discharge Diagnosis: 1. Acute blood loss anemia 2. GI bleed 3. Rectal cancer  Discharge Medications: Allergies as of 05/12/2019   No Known Allergies     Medication List    TAKE these medications   pantoprazole 40 MG tablet Commonly known as: Protonix Take 1 tablet (40 mg total) by mouth daily.       Disposition and follow-up:   Ms.Shannon Terry was discharged from [facility] in Stable condition.  At the hospital follow up visit please address:  1.  Acute blood loss anemia. Please repeat CBC at hospital follow up.  Follow-up Appointments: PCP 3-5d.   Hospital Course: Shannon Terry is a 79 yo female with rectal cancer that was diagnosed and treated incompletedly (pt preference) in 2014 who experiences intermittent rectal bleeding. She presented on 05/11/19 to [facility] ED for progressive fatigue and rectal bleeding and was found to have a presenting hgb of 2.5. She underwent PRBC transfusion x4 with a follow up hgb of 10.6. At time of discharge, rectal bleeding had slowed down. We discussed various options for management of her disease however she is adamant that she does not wish to undergo any further evaluation or management. We encouraged her to establish with a PCP to possibly allow her to receive these blood transfusions at an infusion center in the future however refused and stated she is fine with coming to the ER. She was also encouraged to visit with palliative care for symptomatic management however she does not want this either. Finally, we discussed possibility of her being re-evaluated by GI however said she is doing fine at home and doesn't wish for that. She seems to understand that she will continue to have similar issues to this admission however simply wants to be  comfortable at home and does not want to see other providers.  Discharge Vitals:   BP 138/76   Pulse 97   Temp 98.5 F (36.9 C) (Oral)   Resp 19   Ht 5\' 4"  (1.626 m)   Wt 66.1 kg   SpO2 100%   BMI 25.01 kg/m   Pertinent Labs, Studies, and Procedures:  CBC Latest Ref Rng & Units 05/12/2019 05/12/2019 05/12/2019  WBC 4.0 - 10.5 K/uL 15.8(H) - 12.9(H)  Hemoglobin 12.0 - 15.0 g/dL 10.6(L) - 9.2(L)  Hematocrit 36.0 - 46.0 % 31.6(L) - 27.8(L)  Platelets 150 - 400 K/uL 387 384 367     Discharge Instructions: Discharge Instructions    Call MD for:  difficulty breathing, headache or visual disturbances   Complete by: As directed    Call MD for:  extreme fatigue   Complete by: As directed    Call MD for:  persistant dizziness or light-headedness   Complete by: As directed       Signed: Mitzi Hansen, MD 05/12/2019, 11:21 AM   Pager: [phone]

## 2019-05-12 NOTE — Plan of Care (Signed)
  Problem: Education: Goal: Knowledge of General Education information will improve Description: Including pain rating scale, medication(s)/side effects and non-pharmacologic comfort measures 05/12/2019 0947 by Meriel Flavors, RN Outcome: Adequate for Discharge 05/12/2019 0947 by Meriel Flavors, RN Outcome: Adequate for Discharge   Problem: Health Behavior/Discharge Planning: Goal: Ability to manage health-related needs will improve 05/12/2019 0947 by Meriel Flavors, RN Outcome: Adequate for Discharge 05/12/2019 0947 by Meriel Flavors, RN Outcome: Adequate for Discharge   Problem: Clinical Measurements: Goal: Ability to maintain clinical measurements within normal limits will improve 05/12/2019 0947 by Meriel Flavors, RN Outcome: Adequate for Discharge 05/12/2019 0947 by Meriel Flavors, RN Outcome: Adequate for Discharge Goal: Will remain free from infection 05/12/2019 0947 by Meriel Flavors, RN Outcome: Adequate for Discharge 05/12/2019 0947 by Meriel Flavors, RN Outcome: Adequate for Discharge Goal: Diagnostic test results will improve 05/12/2019 0947 by Meriel Flavors, RN Outcome: Adequate for Discharge 05/12/2019 0947 by Meriel Flavors, RN Outcome: Adequate for Discharge Goal: Respiratory complications will improve 05/12/2019 0947 by Meriel Flavors, RN Outcome: Adequate for Discharge 05/12/2019 0947 by Meriel Flavors, RN Outcome: Adequate for Discharge Goal: Cardiovascular complication will be avoided 05/12/2019 0947 by Meriel Flavors, RN Outcome: Adequate for Discharge 05/12/2019 0947 by Meriel Flavors, RN Outcome: Adequate for Discharge   Problem: Activity: Goal: Risk for activity intolerance will decrease 05/12/2019 0947 by Meriel Flavors, RN Outcome: Adequate for Discharge 05/12/2019 0947 by Meriel Flavors, RN Outcome: Adequate for Discharge   Problem: Nutrition: Goal: Adequate nutrition will be  maintained 05/12/2019 0947 by Meriel Flavors, RN Outcome: Adequate for Discharge 05/12/2019 0947 by Meriel Flavors, RN Outcome: Adequate for Discharge   Problem: Coping: Goal: Level of anxiety will decrease 05/12/2019 0947 by Meriel Flavors, RN Outcome: Adequate for Discharge 05/12/2019 0947 by Meriel Flavors, RN Outcome: Adequate for Discharge   Problem: Elimination: Goal: Will not experience complications related to bowel motility 05/12/2019 0947 by Meriel Flavors, RN Outcome: Adequate for Discharge 05/12/2019 0947 by Meriel Flavors, RN Outcome: Adequate for Discharge Goal: Will not experience complications related to urinary retention 05/12/2019 0947 by Meriel Flavors, RN Outcome: Adequate for Discharge 05/12/2019 0947 by Meriel Flavors, RN Outcome: Adequate for Discharge   Problem: Pain Managment: Goal: General experience of comfort will improve 05/12/2019 0947 by Meriel Flavors, RN Outcome: Adequate for Discharge 05/12/2019 0947 by Meriel Flavors, RN Outcome: Adequate for Discharge   Problem: Safety: Goal: Ability to remain free from injury will improve 05/12/2019 0947 by Meriel Flavors, RN Outcome: Adequate for Discharge 05/12/2019 0947 by Meriel Flavors, RN Outcome: Adequate for Discharge   Problem: Skin Integrity: Goal: Risk for impaired skin integrity will decrease 05/12/2019 0947 by Meriel Flavors, RN Outcome: Adequate for Discharge 05/12/2019 0947 by Meriel Flavors, RN Outcome: Adequate for Discharge

## 2019-05-12 NOTE — Progress Notes (Signed)
[facility] Progress Note Patient Name: Belen Pesch DOB: 07-05-1940 MRN: [id number]   Date of Service  05/12/2019  HPI/Events of Note  Hypertension - BP = 173/82.   eICU Interventions  Will order: 1. Hydralazine 10 mg IV Q 4 hours PRN SBP > 170 or DBP > 100.     Intervention Category Major Interventions: Hypertension - evaluation and management  Smt. Loder Eugene 05/12/2019, 5:11 AM

## 2020-05-02 ENCOUNTER — Inpatient Hospital Stay (HOSPITAL_COMMUNITY)
Admission: EM | Admit: 2020-05-02 | Discharge: 2020-05-07 | DRG: 375 | Disposition: A | Discharge status: Home Health | Payer: Medicare Other | Attending: Internal Medicine | Admitting: Internal Medicine

## 2020-05-02 ENCOUNTER — Other Ambulatory Visit: Payer: Self-pay

## 2020-05-02 ENCOUNTER — Encounter (HOSPITAL_COMMUNITY): Payer: Self-pay | Admitting: *Deleted

## 2020-05-02 DIAGNOSIS — R64 Cachexia: Secondary | ICD-10-CM | POA: Diagnosis present

## 2020-05-02 DIAGNOSIS — I10 Essential (primary) hypertension: Secondary | ICD-10-CM | POA: Diagnosis present

## 2020-05-02 DIAGNOSIS — D72829 Elevated white blood cell count, unspecified: Secondary | ICD-10-CM | POA: Diagnosis present

## 2020-05-02 DIAGNOSIS — I130 Hypertensive heart and chronic kidney disease with heart failure and stage 1 through stage 4 chronic kidney disease, or unspecified chronic kidney disease: Secondary | ICD-10-CM | POA: Diagnosis present

## 2020-05-02 DIAGNOSIS — N179 Acute kidney failure, unspecified: Secondary | ICD-10-CM | POA: Diagnosis present

## 2020-05-02 DIAGNOSIS — C49A0 Gastrointestinal stromal tumor, unspecified site: Secondary | ICD-10-CM | POA: Diagnosis present

## 2020-05-02 DIAGNOSIS — D62 Acute posthemorrhagic anemia: Secondary | ICD-10-CM | POA: Diagnosis not present

## 2020-05-02 DIAGNOSIS — I509 Heart failure, unspecified: Secondary | ICD-10-CM | POA: Diagnosis present

## 2020-05-02 DIAGNOSIS — N1831 Chronic kidney disease, stage 3a: Secondary | ICD-10-CM | POA: Diagnosis present

## 2020-05-02 DIAGNOSIS — D649 Anemia, unspecified: Secondary | ICD-10-CM

## 2020-05-02 DIAGNOSIS — Z6823 Body mass index (BMI) 23.0-23.9, adult: Secondary | ICD-10-CM

## 2020-05-02 DIAGNOSIS — E876 Hypokalemia: Secondary | ICD-10-CM | POA: Diagnosis present

## 2020-05-02 DIAGNOSIS — Z823 Family history of stroke: Secondary | ICD-10-CM

## 2020-05-02 DIAGNOSIS — Z800 Family history of malignant neoplasm of digestive organs: Secondary | ICD-10-CM

## 2020-05-02 DIAGNOSIS — I69354 Hemiplegia and hemiparesis following cerebral infarction affecting left non-dominant side: Secondary | ICD-10-CM

## 2020-05-02 DIAGNOSIS — C49A5 Gastrointestinal stromal tumor of rectum: Secondary | ICD-10-CM | POA: Diagnosis not present

## 2020-05-02 DIAGNOSIS — Z66 Do not resuscitate: Secondary | ICD-10-CM | POA: Diagnosis present

## 2020-05-02 DIAGNOSIS — K921 Melena: Secondary | ICD-10-CM | POA: Diagnosis present

## 2020-05-02 DIAGNOSIS — K625 Hemorrhage of anus and rectum: Secondary | ICD-10-CM

## 2020-05-02 DIAGNOSIS — Z806 Family history of leukemia: Secondary | ICD-10-CM

## 2020-05-02 DIAGNOSIS — R627 Adult failure to thrive: Secondary | ICD-10-CM | POA: Diagnosis present

## 2020-05-02 DIAGNOSIS — F32A Depression, unspecified: Secondary | ICD-10-CM | POA: Diagnosis present

## 2020-05-02 DIAGNOSIS — Z20822 Contact with and (suspected) exposure to covid-19: Secondary | ICD-10-CM | POA: Diagnosis present

## 2020-05-02 DIAGNOSIS — Z833 Family history of diabetes mellitus: Secondary | ICD-10-CM

## 2020-05-02 DIAGNOSIS — Z8249 Family history of ischemic heart disease and other diseases of the circulatory system: Secondary | ICD-10-CM

## 2020-05-02 DIAGNOSIS — I5032 Chronic diastolic (congestive) heart failure: Secondary | ICD-10-CM

## 2020-05-02 LAB — CBC WITH DIFFERENTIAL/PLATELET
Abs Immature Granulocytes: 0.14 10*3/uL — ABNORMAL HIGH (ref 0.00–0.07)
Basophils Absolute: 0 10*3/uL (ref 0.0–0.1)
Basophils Relative: 0 %
Eosinophils Absolute: 0.1 10*3/uL (ref 0.0–0.5)
Eosinophils Relative: 1 %
HCT: 9.3 % — ABNORMAL LOW (ref 36.0–46.0)
Hemoglobin: 2.2 g/dL — CL (ref 12.0–15.0)
Immature Granulocytes: 1 %
Lymphocytes Relative: 7 %
Lymphs Abs: 1.1 10*3/uL (ref 0.7–4.0)
MCH: 12 pg — ABNORMAL LOW (ref 26.0–34.0)
MCHC: 23.7 g/dL — ABNORMAL LOW (ref 30.0–36.0)
MCV: 50.8 fL — ABNORMAL LOW (ref 80.0–100.0)
Monocytes Absolute: 1.5 10*3/uL — ABNORMAL HIGH (ref 0.1–1.0)
Monocytes Relative: 10 %
Neutro Abs: 11.7 10*3/uL — ABNORMAL HIGH (ref 1.7–7.7)
Neutrophils Relative %: 81 %
Platelets: 522 10*3/uL — ABNORMAL HIGH (ref 150–400)
RBC: 1.83 MIL/uL — ABNORMAL LOW (ref 3.87–5.11)
RDW: 21.9 % — ABNORMAL HIGH (ref 11.5–15.5)
WBC: 14.6 10*3/uL — ABNORMAL HIGH (ref 4.0–10.5)
nRBC: 0.1 % (ref 0.0–0.2)

## 2020-05-02 LAB — COMPREHENSIVE METABOLIC PANEL
ALT: 15 U/L (ref 0–44)
AST: 19 U/L (ref 15–41)
Albumin: 2.5 g/dL — ABNORMAL LOW (ref 3.5–5.0)
Alkaline Phosphatase: 76 U/L (ref 38–126)
Anion gap: 10 (ref 5–15)
BUN: 19 mg/dL (ref 8–23)
CO2: 17 mmol/L — ABNORMAL LOW (ref 22–32)
Calcium: 7.7 mg/dL — ABNORMAL LOW (ref 8.9–10.3)
Chloride: 114 mmol/L — ABNORMAL HIGH (ref 98–111)
Creatinine, Ser: 1.42 mg/dL — ABNORMAL HIGH (ref 0.44–1.00)
GFR, Estimated: 38 mL/min — ABNORMAL LOW (ref >60)
Glucose, Bld: 97 mg/dL (ref 70–99)
Potassium: 3.7 mmol/L (ref 3.5–5.1)
Sodium: 141 mmol/L (ref 135–145)
Total Bilirubin: 0.7 mg/dL (ref 0.3–1.2)
Total Protein: 5.2 g/dL — ABNORMAL LOW (ref 6.5–8.1)

## 2020-05-02 LAB — PROTIME-INR
INR: 1.4 — ABNORMAL HIGH (ref 0.8–1.2)
Prothrombin Time: 16.4 seconds — ABNORMAL HIGH (ref 11.4–15.2)

## 2020-05-02 LAB — PREPARE RBC (CROSSMATCH)

## 2020-05-02 MED ORDER — SODIUM CHLORIDE 0.9 % IV SOLN: INTRAVENOUS | Status: DC

## 2020-05-02 MED ORDER — FAMOTIDINE IN NACL 20-0.9 MG/50ML-% IV SOLN
20.0000 mg | INTRAVENOUS | Status: AC
  Administered 2020-05-02: 20 mg via INTRAVENOUS
  Filled 2020-05-02: qty 50

## 2020-05-02 MED ORDER — FENTANYL CITRATE (PF) 100 MCG/2ML IJ SOLN
50.0000 ug | Freq: Once | INTRAMUSCULAR | Status: AC
  Administered 2020-05-02: 50 ug via INTRAVENOUS
  Filled 2020-05-02: qty 2

## 2020-05-02 MED ORDER — PANTOPRAZOLE SODIUM 40 MG IV SOLR
40.0000 mg | INTRAVENOUS | Status: AC
  Administered 2020-05-02: 40 mg via INTRAVENOUS
  Filled 2020-05-02: qty 40

## 2020-05-02 MED ORDER — SODIUM CHLORIDE 0.9% IV SOLUTION: Freq: Once | INTRAVENOUS | Status: DC

## 2020-05-02 NOTE — ED Notes (Signed)
Hgb 2.2 called in per lab. Will alert provider.

## 2020-05-02 NOTE — ED Provider Notes (Signed)
I called report to Dr. Flossie Buffy.  We discussed the case.  Patient to be admitted to the hospitalist service   Ripley Fraise, MD 05/02/20 2357

## 2020-05-02 NOTE — ED Provider Notes (Signed)
[facility] EMERGENCY DEPARTMENT Provider Note   CSN: [id number] Arrival date & time: 05/02/20  1915     History Chief Complaint  Patient presents with  . Rectal Pain    Shannon Terry is a 80 y.o. female.  HPI   This patient is a 80 year old female, she has a history of known malignant cancer, she has elected to forego treatment, the patient is not able to give me much in the way of answers other than telling me her name and that she has been having pain around her bottom, she reports no other further details and states "I do not know" to most of my questions.  The paramedics report that the call went out for rectal bleeding and rectal pain with some increased weakness.  Evidently the patient lives with her husband, he is not here at this time.  I have reviewed the medical records and her last admission to the hospital was in March 2021 at which time she was admitted with a hemoglobin of 2.5 after having rectal bleeding.  Level 5 caveat applies due to altered mental status and confusion  Past Medical History:  Diagnosis Date  . Acute renal failure ([place])   . Anasarca   . Anemia, iron deficiency    chronic blood loss  . Gastritis without bleeding   . GERD (gastroesophageal reflux disease)   . GI bleeding    hx of  . Hematochezia    Prior to Diagniosis  . Hemorrhoids   . History of blood transfusion 04/01/12   Hgb. 2.9/ 6 Units of PRBC  . Iron deficiency anemia due to chronic blood loss 04/05/12  . 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 Obesity    BMI 42 Jan 2014.   Marland Kitchen Rectal cancer ([place]) 04/03/12   gastrointestinal stromal tumor    Patient Active Problem List   Diagnosis Date Noted  . GI bleed 05/11/2019  . GIB (gastrointestinal bleeding) 11/10/2018  . Left leg weakness 11/15/2015  . History of CVA (cerebrovascular accident) 11/15/2015  . Rectal carcinoma ([place])   . Acute CVA (cerebrovascular accident) ([place]) 04/08/2015  . Hypertension 04/07/2015  . 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 GERD (gastroesophageal reflux disease)   . GI bleeding   . Iron deficiency anemia due to chronic blood loss 04/01/2012    Past Surgical History:  Procedure Laterality Date  . ABDOMINAL HYSTERECTOMY     Partial  . BIOPSY STOMACH  04/03/12   neg for malignancy  . COLONOSCOPY  04/03/2012   Procedure: COLONOSCOPY;  Surgeon: Jerene Bears, MD;  Location: [facility];  Service: Gastroenterology;  Laterality: N/A;  . ESOPHAGOGASTRODUODENOSCOPY  04/03/2012   Procedure: ESOPHAGOGASTRODUODENOSCOPY (EGD);  Surgeon: Jerene Bears, MD;  Location: [facility];  Service: Gastroenterology;  Laterality: N/A;  . EUS  04/08/2012   Procedure: LOWER ENDOSCOPIC ULTRASOUND (EUS);  Surgeon: Milus Banister, MD;  Location: [facility];  Service: Endoscopy;  Laterality: N/A;  . RECTAL BIOPSY  04/03/12   gastrointestinal stromal tumor     OB History   No obstetric history on file.    Obstetric Comments  No HRT        Family History  Problem Relation Age of Onset  . Diabetes Mellitus II Father        died from stroke  . Leukemia Brother   . Pancreatic cancer Sister   . Heart attack Brother     Social History   Tobacco Use  . Smoking status: Never  Smoker  . Smokeless tobacco: Never Used  . Tobacco comment: never used tobacco  Substance Use Topics  . Alcohol use: No  . Drug use: No    Home Medications Prior to Admission medications   Medication Sig Start Date End Date Taking? Authorizing Provider  pantoprazole (PROTONIX) 40 MG tablet Take 1 tablet (40 mg total) by mouth daily. 11/13/18 12/13/18  Little Ishikawa, MD    Allergies    Patient has no known allergies.  Review of Systems   Review of Systems  Unable to perform ROS: Mental status change    Physical Exam Updated Vital Signs BP (!) 110/54   Pulse 90   Temp (!) 97.2 F (36.2 C) (Oral)   Resp 16   SpO2 98%   Physical Exam Vitals and nursing note reviewed.  Constitutional:      General: She is  not in acute distress.    Appearance: She is well-developed and well-nourished.  HENT:     Head: Normocephalic and atraumatic.     Mouth/Throat:     Mouth: Oropharynx is clear and moist.     Pharynx: No oropharyngeal exudate.     Comments: Very pale MM Eyes:     General: No scleral icterus.       Right eye: No discharge.        Left eye: No discharge.     Extraocular Movements: EOM normal.     Pupils: Pupils are equal, round, and reactive to light.     Comments: Very pale conjunctivae  Neck:     Thyroid: No thyromegaly.     Vascular: No JVD.  Cardiovascular:     Rate and Rhythm: Normal rate and regular rhythm.     Pulses: Intact distal pulses.     Heart sounds: Normal heart sounds. No murmur heard. No friction rub. No gallop.   Pulmonary:     Effort: Pulmonary effort is normal. No respiratory distress.     Breath sounds: Normal breath sounds. No wheezing or rales.  Abdominal:     General: Bowel sounds are normal. There is no distension.     Palpations: Abdomen is soft. There is no mass.     Tenderness: There is no abdominal tenderness.  Genitourinary:    Comments: Visible rectal mass extruding from the anus, on digital exam there is palpable mass in the anus and rectal cavity.  There does appear to be bright red blood pouring from the rectum Musculoskeletal:        General: No tenderness or edema. Normal range of motion.     Cervical back: Normal range of motion and neck supple.  Lymphadenopathy:     Cervical: No cervical adenopathy.  Skin:    General: Skin is warm and dry.     Findings: No erythema or rash.  Neurological:     Mental Status: She is alert.     Coordination: Coordination normal.     Comments: Generally weak, mildly confused, able to perform all the commands I asked her  Psychiatric:        Mood and Affect: Mood and affect and mood normal.     ED Results / Procedures / Treatments   Labs (all labs ordered are listed, but only abnormal results are  displayed) Labs Reviewed  CBC WITH DIFFERENTIAL/PLATELET  COMPREHENSIVE METABOLIC PANEL  PROTIME-INR  TYPE AND SCREEN    EKG None  Radiology No results found.  Procedures .Critical Care Performed by: Noemi Chapel, MD Authorized by: Sabra Heck,  Aaron Edelman, MD   Critical care provider statement:    Critical care time (minutes):  35   Critical care time was exclusive of:  Separately billable procedures and treating other patients and teaching time   Critical care was necessary to treat or prevent imminent or life-threatening deterioration of the following conditions: Severe anemia, rectal bleed.   Critical care was time spent personally by me on the following activities:  Blood draw for specimens, development of treatment plan with patient or surrogate, discussions with consultants, evaluation of patient's response to treatment, examination of patient, obtaining history from patient or surrogate, ordering and performing treatments and interventions, ordering and review of laboratory studies, ordering and review of radiographic studies, pulse oximetry, re-evaluation of patient's condition and review of old charts     Medications Ordered in ED Medications  0.9 %  sodium chloride infusion (has no administration in time range)  pantoprazole (PROTONIX) injection 40 mg (has no administration in time range)  famotidine (PEPCID) IVPB 20 mg premix (has no administration in time range)    ED Course  I have reviewed the triage vital signs and the nursing notes.  Pertinent labs & imaging results that were available during my care of the patient were reviewed by me and considered in my medical decision making (see chart for details).  Clinical Course as of 05/02/20 2311  Sun May 02, 2020  2133 I have reexamined the patient at 9:30 PM, she reports that she still having rectal pain, requesting pain medication, fentanyl ordered.  Blood pressure is currently 122/52, she is adamant that she does not want  treatment for her cancer, she states that her rectal pain and bleeding flares up from time to time but over the last couple of days it has been particularly bad with both pain and bleeding. [BM]    Clinical Course User Index [BM] Noemi Chapel, MD   MDM Rules/Calculators/A&P                          This patient has a history of primary gastrointestinal cancer and continues to have what appears to be rectal bleeding.  She does endorse having lots of blood clots this last week and on exam has bright red blood in the rectal vault.  She has pain likely from the intrusion of the tumor and local spread, she has vital signs which are surprisingly showing no signs of tachycardia or significant hypotension.  She is extremely pale suggestive of severe anemia.  Type and screen, labs, cardiac monitoring, gentle fluids, the patient will likely need to be admitted and transfused, labs pending  Hemoglobin 2.2, patient critically ill, needs transfusion and admission, does not have any surgical procedures  Final Clinical Impression(s) / ED Diagnoses Final diagnoses:  Severe anemia  Rectal bleeding      Noemi Chapel, MD 05/02/20 2311

## 2020-05-02 NOTE — ED Triage Notes (Signed)
Pt is brought in by [facility] from home due to genital and rectal pain and pressure.  Pt lives independently with her spouse.  Per ems pt has rectal cancer (x several years) and is not currently undergoing any treatments.  EMS also informs me that her daughter told them that pt last BM was Thursday and there was some bleeding.  Pt is alert and oriented.

## 2020-05-03 ENCOUNTER — Encounter (HOSPITAL_COMMUNITY): Payer: Self-pay | Admitting: Family Medicine

## 2020-05-03 DIAGNOSIS — I130 Hypertensive heart and chronic kidney disease with heart failure and stage 1 through stage 4 chronic kidney disease, or unspecified chronic kidney disease: Secondary | ICD-10-CM | POA: Diagnosis present

## 2020-05-03 DIAGNOSIS — I5032 Chronic diastolic (congestive) heart failure: Secondary | ICD-10-CM

## 2020-05-03 DIAGNOSIS — Z8249 Family history of ischemic heart disease and other diseases of the circulatory system: Secondary | ICD-10-CM | POA: Diagnosis not present

## 2020-05-03 DIAGNOSIS — N1831 Chronic kidney disease, stage 3a: Secondary | ICD-10-CM | POA: Diagnosis present

## 2020-05-03 DIAGNOSIS — I509 Heart failure, unspecified: Secondary | ICD-10-CM | POA: Diagnosis present

## 2020-05-03 DIAGNOSIS — R627 Adult failure to thrive: Secondary | ICD-10-CM | POA: Diagnosis present

## 2020-05-03 DIAGNOSIS — N179 Acute kidney failure, unspecified: Secondary | ICD-10-CM

## 2020-05-03 DIAGNOSIS — I10 Essential (primary) hypertension: Secondary | ICD-10-CM

## 2020-05-03 DIAGNOSIS — K921 Melena: Secondary | ICD-10-CM | POA: Diagnosis present

## 2020-05-03 DIAGNOSIS — R64 Cachexia: Secondary | ICD-10-CM | POA: Diagnosis present

## 2020-05-03 DIAGNOSIS — C49A5 Gastrointestinal stromal tumor of rectum: Principal | ICD-10-CM

## 2020-05-03 DIAGNOSIS — Z833 Family history of diabetes mellitus: Secondary | ICD-10-CM | POA: Diagnosis not present

## 2020-05-03 DIAGNOSIS — Z823 Family history of stroke: Secondary | ICD-10-CM | POA: Diagnosis not present

## 2020-05-03 DIAGNOSIS — Z806 Family history of leukemia: Secondary | ICD-10-CM | POA: Diagnosis not present

## 2020-05-03 DIAGNOSIS — Z66 Do not resuscitate: Secondary | ICD-10-CM | POA: Diagnosis present

## 2020-05-03 DIAGNOSIS — Z20822 Contact with and (suspected) exposure to covid-19: Secondary | ICD-10-CM | POA: Diagnosis present

## 2020-05-03 DIAGNOSIS — R9431 Abnormal electrocardiogram [ECG] [EKG]: Secondary | ICD-10-CM | POA: Diagnosis not present

## 2020-05-03 DIAGNOSIS — D72829 Elevated white blood cell count, unspecified: Secondary | ICD-10-CM | POA: Diagnosis present

## 2020-05-03 DIAGNOSIS — F32A Depression, unspecified: Secondary | ICD-10-CM | POA: Diagnosis present

## 2020-05-03 DIAGNOSIS — D62 Acute posthemorrhagic anemia: Secondary | ICD-10-CM | POA: Diagnosis present

## 2020-05-03 DIAGNOSIS — Z800 Family history of malignant neoplasm of digestive organs: Secondary | ICD-10-CM | POA: Diagnosis not present

## 2020-05-03 DIAGNOSIS — Z6823 Body mass index (BMI) 23.0-23.9, adult: Secondary | ICD-10-CM | POA: Diagnosis not present

## 2020-05-03 DIAGNOSIS — I69354 Hemiplegia and hemiparesis following cerebral infarction affecting left non-dominant side: Secondary | ICD-10-CM | POA: Diagnosis not present

## 2020-05-03 DIAGNOSIS — E876 Hypokalemia: Secondary | ICD-10-CM | POA: Diagnosis present

## 2020-05-03 LAB — BASIC METABOLIC PANEL
Anion gap: 10 (ref 5–15)
BUN: 14 mg/dL (ref 8–23)
CO2: 18 mmol/L — ABNORMAL LOW (ref 22–32)
Calcium: 7.8 mg/dL — ABNORMAL LOW (ref 8.9–10.3)
Chloride: 116 mmol/L — ABNORMAL HIGH (ref 98–111)
Creatinine, Ser: 1.3 mg/dL — ABNORMAL HIGH (ref 0.44–1.00)
GFR, Estimated: 42 mL/min — ABNORMAL LOW (ref >60)
Glucose, Bld: 102 mg/dL — ABNORMAL HIGH (ref 70–99)
Potassium: 3.4 mmol/L — ABNORMAL LOW (ref 3.5–5.1)
Sodium: 144 mmol/L (ref 135–145)

## 2020-05-03 LAB — CBC
HCT: 18.1 % — ABNORMAL LOW (ref 36.0–46.0)
HCT: 26.5 % — ABNORMAL LOW (ref 36.0–46.0)
Hemoglobin: 5.4 g/dL — CL (ref 12.0–15.0)
Hemoglobin: 8.9 g/dL — ABNORMAL LOW (ref 12.0–15.0)
MCH: 19.4 pg — ABNORMAL LOW (ref 26.0–34.0)
MCH: 23.4 pg — ABNORMAL LOW (ref 26.0–34.0)
MCHC: 29.8 g/dL — ABNORMAL LOW (ref 30.0–36.0)
MCHC: 33.6 g/dL (ref 30.0–36.0)
MCV: 65.1 fL — ABNORMAL LOW (ref 80.0–100.0)
MCV: 69.6 fL — ABNORMAL LOW (ref 80.0–100.0)
Platelets: 505 10*3/uL — ABNORMAL HIGH (ref 150–400)
Platelets: 411 10*3/uL — ABNORMAL HIGH (ref 150–400)
RBC: 2.78 MIL/uL — ABNORMAL LOW (ref 3.87–5.11)
RBC: 3.81 MIL/uL — ABNORMAL LOW (ref 3.87–5.11)
WBC: 18.7 10*3/uL — ABNORMAL HIGH (ref 4.0–10.5)
WBC: 19.9 10*3/uL — ABNORMAL HIGH (ref 4.0–10.5)
nRBC: 0.3 % — ABNORMAL HIGH (ref 0.0–0.2)
nRBC: 0.3 % — ABNORMAL HIGH (ref 0.0–0.2)

## 2020-05-03 LAB — PREPARE RBC (CROSSMATCH)

## 2020-05-03 LAB — HEMOGLOBIN AND HEMATOCRIT, BLOOD
HCT: 28.4 % — ABNORMAL LOW (ref 36.0–46.0)
Hemoglobin: 9.1 g/dL — ABNORMAL LOW (ref 12.0–15.0)

## 2020-05-03 LAB — SARS CORONAVIRUS 2 (TAT 6-24 HRS): SARS Coronavirus 2: NEGATIVE

## 2020-05-03 MED ORDER — VITAMIN B-12 100 MCG PO TABS
100.0000 ug | ORAL_TABLET | Freq: Every day | ORAL | Status: DC
  Administered 2020-05-03 – 2020-05-07 (×3): 100 ug via ORAL
  Filled 2020-05-03 (×5): qty 1

## 2020-05-03 MED ORDER — SODIUM CHLORIDE 0.9% IV SOLUTION: Freq: Once | INTRAVENOUS | Status: AC

## 2020-05-03 MED ORDER — SODIUM CHLORIDE 0.9% IV SOLUTION: Freq: Once | INTRAVENOUS | Status: DC

## 2020-05-03 MED ORDER — POLYETHYLENE GLYCOL 3350 17 G PO PACK: 17.0000 g | PACK | Freq: Every day | ORAL | Status: DC | PRN

## 2020-05-03 MED ORDER — FOLIC ACID 1 MG PO TABS
1.0000 mg | ORAL_TABLET | Freq: Every day | ORAL | Status: DC
  Administered 2020-05-03 – 2020-05-07 (×3): 1 mg via ORAL
  Filled 2020-05-03 (×3): qty 1

## 2020-05-03 MED ORDER — ACETAMINOPHEN 325 MG PO TABS
650.0000 mg | ORAL_TABLET | Freq: Once | ORAL | Status: AC
  Administered 2020-05-03: 650 mg via ORAL
  Filled 2020-05-03: qty 2

## 2020-05-03 MED ORDER — HYDROCODONE-ACETAMINOPHEN 5-325 MG PO TABS
1.0000 | ORAL_TABLET | ORAL | Status: DC | PRN
  Administered 2020-05-03 – 2020-05-06 (×5): 1 via ORAL
  Filled 2020-05-03 (×5): qty 1

## 2020-05-03 MED ORDER — DIPHENHYDRAMINE HCL 25 MG PO CAPS
25.0000 mg | ORAL_CAPSULE | Freq: Once | ORAL | Status: AC
  Administered 2020-05-03: 25 mg via ORAL
  Filled 2020-05-03: qty 1

## 2020-05-03 MED ORDER — POTASSIUM CHLORIDE CRYS ER 20 MEQ PO TBCR
40.0000 meq | EXTENDED_RELEASE_TABLET | Freq: Once | ORAL | Status: AC
  Administered 2020-05-03: 40 meq via ORAL
  Filled 2020-05-03: qty 2

## 2020-05-03 MED ORDER — FERROUS SULFATE 325 (65 FE) MG PO TABS
325.0000 mg | ORAL_TABLET | Freq: Every day | ORAL | Status: DC
  Filled 2020-05-03: qty 1

## 2020-05-03 MED ORDER — HYDROCORTISONE 1 % EX CREA
TOPICAL_CREAM | Freq: Two times a day (BID) | CUTANEOUS | Status: DC
  Filled 2020-05-03: qty 28

## 2020-05-03 MED ORDER — MORPHINE SULFATE (PF) 2 MG/ML IV SOLN: 1.0000 mg | INTRAVENOUS | Status: DC | PRN

## 2020-05-03 MED ORDER — FUROSEMIDE 10 MG/ML IJ SOLN
20.0000 mg | Freq: Once | INTRAMUSCULAR | Status: AC
  Administered 2020-05-03: 20 mg via INTRAVENOUS
  Filled 2020-05-03: qty 2

## 2020-05-03 MED ORDER — SODIUM CHLORIDE 0.9 % IV BOLUS
500.0000 mL | Freq: Once | INTRAVENOUS | Status: AC
  Administered 2020-05-03: 500 mL via INTRAVENOUS

## 2020-05-03 NOTE — Progress Notes (Addendum)
OT Cancellation Note  Patient Details Name: Shannon Terry MRN: [id number] DOB: 01/18/1941   Cancelled Treatment:    Reason Eval/Treat Not Completed: Medical issues which prohibited therapy (HBG of 5.4). OT will continue to follow for appropriateness for evaluation.   [name] [name] 05/03/2020, 4:42 PM   Jesse Sans OTR/L Acute Rehabilitation Services Pager: [phone] Office: [phone]

## 2020-05-03 NOTE — Plan of Care (Signed)
  Problem: Education: Goal: Knowledge of General Education information will improve Description: Including pain rating scale, medication(s)/side effects and non-pharmacologic comfort measures Outcome: Progressing   Problem: Health Behavior/Discharge Planning: Goal: Ability to manage health-related needs will improve Outcome: Progressing   Problem: Clinical Measurements: Goal: Will remain free from infection Outcome: Progressing Goal: Respiratory complications will improve Outcome: Progressing Goal: Cardiovascular complication will be avoided Outcome: Progressing   Problem: Elimination: Goal: Will not experience complications related to urinary retention Outcome: Progressing   Problem: Pain Managment: Goal: General experience of comfort will improve Outcome: Progressing

## 2020-05-03 NOTE — Progress Notes (Signed)
PROGRESS NOTE    Shannon Terry  QPY:[id number] DOB: [birth year] DOA: 05/02/2020 PCP: Patient, No Pcp Per   Brief Narrative: 80 year old with past medical history significant for history of rectal GIST diagnosed 2014 previously followed by Dr. Marin Olp, hypertension who presented with rectal bleeding and increased rectal pain.   About 2 days prior to admission patient noticed 2 days of bright red blood per rectum with large clots.  This has since resolved but she presents to the emergency department complaining of increased rectal pain.  Of note patient was diagnosed with rectal GIST back in 2014, initially follow with oncology and treated with imatinib and Gleevec.  Patient subsequently decided not to pursue any treatment for her malignancy.  She has had numerous hospitalization in the past for anemia and GI bleed and refused work-up.  She also refused palliative care.   Assessment & Plan:   Principal Problem:   Acute blood loss anemia Active Problems:   Malignant GIST (HCC)   Hypertension   AKI (acute kidney injury) ([place])   Malignant cachexia (HCC)   Hemiplegia and hemiparesis following cerebral infarction affecting left non-dominant side (HCC)   Congestive heart failure, unspecified HF chronicity, unspecified heart failure type ([place])   1-Acute blood loss Anemia from rectal bleeding/history of Rectal GIST;  Severe  anemia: Patient presented with a hemoglobin of 2.2.  One Year ago her hemoglobin was 10.6. Rectal bleeding  has resolved on admission. Patient has received 3 units of packed red blood cells, receiving the fourth unit currently. Hemoglobin has increased to 5 after second unit of blood. Will repeat hemoglobin post last blood transfusion I have started patient on iron and B12 supplement.  2-mild AKI: Secondary to acute blood loss anemia Renal function stable  3-hypertension: Monitor for now due to low hemoglobin  4-rectal GIST: On prior CT scan 2017 tumor size was 8  cm. Patient does not want to pursue any further treatment. Declined palliative care. She report her pain has improved.  Continue with pain medication.  5-hypokalemia: Order oral potassium  Estimated body mass index is 23.8 kg/m as calculated from the following:   Height as of this encounter: 5\' 4"  (1.626 m).   Weight as of this encounter: 62.9 kg.   DVT prophylaxis: SCD Code Status: DNR, per patient wishes.  Family Communication: Daughter and husband who were at bedside.  Discussed with patient and family: I was recommending palliative care consultation, for them to be able to follow-up patient at home and patient could  get pain medication outpatient.  I also explained that she can follow-up with Dr. Marin Olp and get blood transfusion at the cancer center.  She does not want to do that.  Disposition Plan:  Status is: Inpatient  Remains inpatient appropriate because:IV treatments appropriate due to intensity of illness or inability to take PO   Dispo: The patient is from: Home              Anticipated d/c is to: Home              Patient currently is not medically stable to d/c.   Difficult to place patient No        Consultants:   None   Procedures:   None  Antimicrobials:    Subjective: She report improvement of abdominal pain.  She first agree to speak with palliative, then change her mind.  She has been passing gas, no BM while in the hospital.  She does not wants to eat.  Objective: Vitals:   05/03/20 0829 05/03/20 0858 05/03/20 1141 05/03/20 1157  BP: (!) 163/77 (!) 152/75 (!) 160/78 (!) 150/85  Pulse:  75 81   Resp:  13 14   Temp: 98.8 F (37.1 C) 97.8 F (36.6 C) 97.6 F (36.4 C) 98 F (36.7 C)  TempSrc: Oral Oral Oral Oral  SpO2:  100%    Weight:      Height:        Intake/Output Summary (Last 24 hours) at 05/03/2020 1327 Last data filed at 05/03/2020 1030 Gross per 24 hour  Intake 1020.83 ml  Output --  Net 1020.83 ml   Filed  Weights   05/02/20 2047 05/03/20 0331  Weight: 66 kg 62.9 kg    Examination:  General exam: Appears calm and comfortable  Respiratory system: Clear to auscultation. Respiratory effort normal. Cardiovascular system: S1 & S2 heard, RRR. No JVD, murmurs, rubs, gallops or clicks. No pedal edema. Gastrointestinal system: Abdomen is nondistended, soft and nontender. No organomegaly or masses felt. Normal bowel sounds heard. Central nervous system: Alert and oriented. No focal neurological deficits. Extremities: Symmetric 5 x 5 power.    Data Reviewed: I have personally reviewed following labs and imaging studies  CBC: Recent Labs  Lab 05/02/20 2216 05/03/20 0707  WBC 14.6* 18.7*  NEUTROABS 11.7*  --   HGB 2.2* 5.4*  HCT 9.3* 18.1*  MCV 50.8* 65.1*  PLT 522* 790*   Basic Metabolic Panel: Recent Labs  Lab 05/02/20 2021 05/03/20 0707  NA 141 144  K 3.7 3.4*  CL 114* 116*  CO2 17* 18*  GLUCOSE 97 102*  BUN 19 14  CREATININE 1.42* 1.30*  CALCIUM 7.7* 7.8*   GFR: Estimated Creatinine Clearance: 31.3 mL/min (A) (by C-G formula based on SCr of 1.3 mg/dL (H)). Liver Function Tests: Recent Labs  Lab 05/02/20 2021  AST 19  ALT 15  ALKPHOS 76  BILITOT 0.7  PROT 5.2*  ALBUMIN 2.5*   No results for input(s): LIPASE, AMYLASE in the last 168 hours. No results for input(s): AMMONIA in the last 168 hours. Coagulation Profile: Recent Labs  Lab 05/02/20 2021  INR 1.4*   Cardiac Enzymes: No results for input(s): CKTOTAL, CKMB, CKMBINDEX, TROPONINI in the last 168 hours. BNP (last 3 results) No results for input(s): PROBNP in the last 8760 hours. HbA1C: No results for input(s): HGBA1C in the last 72 hours. CBG: No results for input(s): GLUCAP in the last 168 hours. Lipid Profile: No results for input(s): CHOL, HDL, LDLCALC, TRIG, CHOLHDL, LDLDIRECT in the last 72 hours. Thyroid Function Tests: No results for input(s): TSH, T4TOTAL, FREET4, T3FREE, THYROIDAB in the last  72 hours. Anemia Panel: No results for input(s): VITAMINB12, FOLATE, FERRITIN, TIBC, IRON, RETICCTPCT in the last 72 hours. Sepsis Labs: No results for input(s): PROCALCITON, LATICACIDVEN in the last 168 hours.  Recent Results (from the past 240 hour(s))  SARS CORONAVIRUS 2 (TAT 6-24 HRS) Nasopharyngeal Nasopharyngeal Swab     Status: None   Collection Time: 05/02/20 11:51 PM   Specimen: Nasopharyngeal Swab  Result Value Ref Range Status   SARS Coronavirus 2 NEGATIVE NEGATIVE Final    Comment: (NOTE) SARS-CoV-2 target nucleic acids are NOT DETECTED.  The SARS-CoV-2 RNA is generally detectable in upper and lower respiratory specimens during the acute phase of infection. Negative results do not preclude SARS-CoV-2 infection, do not rule out co-infections with other pathogens, and should not be used as the sole basis for treatment or other patient management decisions.  Negative results must be combined with clinical observations, patient history, and epidemiological information. The expected result is Negative.  Fact Sheet for Patients: SugarRoll.be  Fact Sheet for Healthcare Providers: https://www.woods-mathews.com/  This test is not yet approved or cleared by the [place] FDA and  has been authorized for detection and/or diagnosis of SARS-CoV-2 by FDA under an Emergency Use Authorization (EUA). This EUA will remain  in effect (meaning this test can be used) for the duration of the COVID-19 declaration under Se ction 564(b)(1) of the Act, 21 U.S.C. section 360bbb-3(b)(1), unless the authorization is terminated or revoked sooner.  Performed at [facility], [place] [address] [place], [place] [address]          Radiology Studies: No results found.      Scheduled Meds: . sodium chloride   Intravenous Once  . sodium chloride   Intravenous Once  . sodium chloride   Intravenous Once  . [START ON 05/04/2020] ferrous  sulfate  325 mg Oral Q breakfast  . folic acid  1 mg Oral Daily  . vitamin B-12  100 mcg Oral Daily   Continuous Infusions: . sodium chloride Stopped (05/02/20 2334)     LOS: 0 days    Time spent: 35 minutes.     Elmarie Shiley, MD [facility]   If 7PM-7AM, please contact night-coverage [email or web address]  05/03/2020, 1:27 PM

## 2020-05-03 NOTE — Progress Notes (Signed)
Patient with large bright red BM with large clots, Dr Tyrell Antonio made aware.

## 2020-05-03 NOTE — Progress Notes (Signed)
PT Cancellation Note  Patient Details Name: Shannon Terry MRN: [id number] DOB: 1941-01-21   Cancelled Treatment:    Reason Eval/Treat Not Completed: Medical issues which prohibited therapy, HGB 5.4.   Lyanne Co, DPT [facility] [phone]   [place] 05/03/2020, 1:00 PM

## 2020-05-03 NOTE — Progress Notes (Signed)
Patient arrived to unit via stretcher.  Blood infusing at 175 ml/hr.  Vitals stable at this time. CHG bath given.  Patient oriented to room and call bell.  No needs at this time.

## 2020-05-03 NOTE — H&P (Addendum)
History and Physical    Shannon Terry PYP:[id number] DOB: [birth year] DOA: 05/02/2020  PCP: Patient, No Pcp Per  Patient coming from: Home  I have personally briefly reviewed patient's old medical records in [place]  Chief Complaint: rectal pain  HPI: Shannon Terry is a 80 y.o. female with medical history significant for Hx of Rectal GIST dx 2014 previously followed by oncology, HTN who presents with rectal bleeding and increased rectal pain.  About 2 days ago she noticed 2 days of bright red blood per rectum with large clots.  This has since resolved but she presents today given increased rectal pain.  Evidently, patient was diagnosed with renal GIST back in 2014 and initially was following oncology outpatient and was treated on imatinib for some time with poor follow-up and eventually has not seen oncology since 2016.  She has had numerous hospitalizations in the past for acute blood loss anemia due to her rectal mass and refused further work-up.  Patient is refusing any further imaging at this time.  States "if it is time to go she will go."  She does not want any specialist consultation including oncology, GI or palliative care.  States she just wants to go along with what she is doing now and she does not like to see doctors.  In the ED, she is hemodynamically stable.  Hemoglobin found to be severely anemic at 2.2.  Platelet of 522.  Creatinine stable at 1.42 which seems to be close to her baseline of about 1.2-1.3.  Review of Systems:  Constitutional: No Weight Change, No Fever ENT/Mouth: No sore throat, No Rhinorrhea Eyes: No Eye Pain, No Vision Changes Cardiovascular: No Chest Pain, no SOB Respiratory: No Cough, No Sputum, No Wheezing, no Dyspnea  Gastrointestinal: No Nausea, No Vomiting, No Diarrhea, No Constipation, No Pain Genitourinary: no Urinary Incontinence, No Urgency, No Flank Pain Musculoskeletal: No Arthralgias, No Myalgias Skin: No Skin Lesions, No  Pruritus, Neuro: no Weakness, No Numbness,  Psych: No Anxiety/Panic, No Depression, no decrease appetite Heme/Lymph: No Bruising, + Bleeding  Past Medical History:  Diagnosis Date  . Acute renal failure ([place])   . Anasarca   . Anemia, iron deficiency    chronic blood loss  . Gastritis without bleeding   . GERD (gastroesophageal reflux disease)   . GI bleeding    hx of  . Hematochezia    Prior to Diagniosis  . Hemorrhoids   . History of blood transfusion 04/01/12   Hgb. 2.9/ 6 Units of PRBC  . Iron deficiency anemia due to chronic blood loss 04/05/12  . Malignant GIST ([place]) 05/30/2012  . Obesity    BMI 42 Jan 2014.   Marland Kitchen Rectal cancer ([place]) 04/03/12   gastrointestinal stromal tumor    Past Surgical History:  Procedure Laterality Date  . ABDOMINAL HYSTERECTOMY     Partial  . BIOPSY STOMACH  04/03/12   neg for malignancy  . COLONOSCOPY  04/03/2012   Procedure: COLONOSCOPY;  Surgeon: Jerene Bears, MD;  Location: [facility];  Service: Gastroenterology;  Laterality: N/A;  . ESOPHAGOGASTRODUODENOSCOPY  04/03/2012   Procedure: ESOPHAGOGASTRODUODENOSCOPY (EGD);  Surgeon: Jerene Bears, MD;  Location: [facility];  Service: Gastroenterology;  Laterality: N/A;  . EUS  04/08/2012   Procedure: LOWER ENDOSCOPIC ULTRASOUND (EUS);  Surgeon: Milus Banister, MD;  Location: [facility];  Service: Endoscopy;  Laterality: N/A;  . RECTAL BIOPSY  04/03/12   gastrointestinal stromal tumor     reports that she has never smoked.  She has never used smokeless tobacco. She reports that she does not drink alcohol and does not use drugs. Social History  No Known Allergies  Family History  Problem Relation Age of Onset  . Diabetes Mellitus II Father        died from stroke  . Leukemia Brother   . Pancreatic cancer Sister   . Heart attack Brother      Prior to Admission medications   Medication Sig Start Date End Date Taking? Authorizing Provider  pantoprazole (PROTONIX) 40 MG tablet Take 1 tablet  (40 mg total) by mouth daily. 11/13/18 12/13/18  Little Ishikawa, MD    Physical Exam: Vitals:   05/02/20 2300 05/02/20 2330 05/02/20 2331 05/02/20 2350  BP: (!) 116/58 (!) 141/60 (!) 141/60 (!) 128/58  Pulse:   90 86  Resp: 16 (!) 30 (!) 29 15  Temp:   98.8 F (37.1 C) 99 F (37.2 C)  TempSrc:   Oral Oral  SpO2:   100% 100%  Weight:      Height:        Constitutional: NAD, calm, comfortable, elderly female appearing younger than her stated age laying flat in bed Vitals:   05/02/20 2300 05/02/20 2330 05/02/20 2331 05/02/20 2350  BP: (!) 116/58 (!) 141/60 (!) 141/60 (!) 128/58  Pulse:   90 86  Resp: 16 (!) 30 (!) 29 15  Temp:   98.8 F (37.1 C) 99 F (37.2 C)  TempSrc:   Oral Oral  SpO2:   100% 100%  Weight:      Height:       Eyes: PERRL, lids and conjunctivae normal ENMT: Mucous membranes are moist.  Neck: normal, supple Respiratory: clear to auscultation bilaterally, no wheezing, no crackles. Normal respiratory effort. No accessory muscle use.  Cardiovascular: Tachycardia, no murmurs / rubs / gallops. No extremity edema. Abdomen: no tenderness, no masses palpated.  Bowel sounds positive.  Musculoskeletal: no clubbing / cyanosis. No joint deformity upper and lower extremities. Good ROM, no contractures. Normal muscle tone.  Rectal exam: no BRBPR, external hemorroids noted Skin: no rashes, lesions, ulcers. No induration Neurologic: CN 2-12 grossly intact. Sensation intact,  Strength 5/5 in all 4.  Psychiatric: Normal judgment and insight. Alert and oriented x 3. Normal mood.     Labs on Admission: I have personally reviewed following labs and imaging studies  CBC: Recent Labs  Lab 05/02/20 2216  WBC 14.6*  NEUTROABS 11.7*  HGB 2.2*  HCT 9.3*  MCV 50.8*  PLT 627*   Basic Metabolic Panel: Recent Labs  Lab 05/02/20 2021  NA 141  K 3.7  CL 114*  CO2 17*  GLUCOSE 97  BUN 19  CREATININE 1.42*  CALCIUM 7.7*   GFR: Estimated Creatinine Clearance: 31  mL/min (A) (by C-G formula based on SCr of 1.42 mg/dL (H)). Liver Function Tests: Recent Labs  Lab 05/02/20 2021  AST 19  ALT 15  ALKPHOS 76  BILITOT 0.7  PROT 5.2*  ALBUMIN 2.5*   No results for input(s): LIPASE, AMYLASE in the last 168 hours. No results for input(s): AMMONIA in the last 168 hours. Coagulation Profile: Recent Labs  Lab 05/02/20 2021  INR 1.4*   Cardiac Enzymes: No results for input(s): CKTOTAL, CKMB, CKMBINDEX, TROPONINI in the last 168 hours. BNP (last 3 results) No results for input(s): PROBNP in the last 8760 hours. HbA1C: No results for input(s): HGBA1C in the last 72 hours. CBG: No results for input(s): GLUCAP in the last  168 hours. Lipid Profile: No results for input(s): CHOL, HDL, LDLCALC, TRIG, CHOLHDL, LDLDIRECT in the last 72 hours. Thyroid Function Tests: No results for input(s): TSH, T4TOTAL, FREET4, T3FREE, THYROIDAB in the last 72 hours. Anemia Panel: No results for input(s): VITAMINB12, FOLATE, FERRITIN, TIBC, IRON, RETICCTPCT in the last 72 hours. Urine analysis:    Component Value Date/Time   COLORURINE YELLOW 11/10/2018 1250   APPEARANCEUR CLEAR 11/10/2018 1250   LABSPEC 1.013 11/10/2018 1250   PHURINE 5.0 11/10/2018 1250   GLUCOSEU NEGATIVE 11/10/2018 1250   HGBUR NEGATIVE 11/10/2018 1250   BILIRUBINUR NEGATIVE 11/10/2018 1250   KETONESUR NEGATIVE 11/10/2018 1250   PROTEINUR NEGATIVE 11/10/2018 1250   UROBILINOGEN 1.0 04/01/2012 0100   NITRITE NEGATIVE 11/10/2018 1250   LEUKOCYTESUR NEGATIVE 11/10/2018 1250    Radiological Exams on Admission: No results found.    Assessment/Plan  Acute blood loss anemia from rectal bleed w/hx of Rectal GIST  Rectal mass Dx in 2014 and was following with oncology for treatment until 2016 before she quit treatment on her own Hgb of 2.2 on admit. Last Hgb a yr ago of 10.6. No active rectal bleeding on exam. Will Transfuse 3 units and repeat CBC afterwards transfusion threshold of  hemoglobin less than 7 Patient has adamantly refused any more specialist work-up in the past.  She continues to refuse further imaging today to assess the current status of her rectal mass.  Refused any GI, oncology or palliative care consult despite extensive discussion regarding her prognosis.  Mild AKI Secondary to acute blood loss.  Follow with BMP in the morning.  Hypertension Controlled without antihypertensives  Depressive mood Pt appears to have depression based on our conversation. Recommends follow up outpatient with PCP  DVT prophylaxis:SCDs Code Status: Full Family Communication: Plan discussed with patient at bedside  disposition Plan: Home with at least 2 midnight stays  Consults called:  Admission status: inpatient  Level of care: Progressive  Status is: Inpatient  Remains inpatient appropriate because:Inpatient level of care appropriate due to severity of illness   Dispo: The patient is from: Home              Anticipated d/c is to: Home              Patient currently is not medically stable to d/c.   Difficult to place patient No      Orene Desanctis DO [facility]   If 7PM-7AM, please contact night-coverage [email or web address]   05/03/2020, 12:27 AM

## 2020-05-03 NOTE — ED Notes (Signed)
Can not draw labs at this time Pt getting blood infusion.

## 2020-05-03 NOTE — Progress Notes (Addendum)
Notified by nursing that given patient's severe anemia she would not qualify for progressive/stepdown unit. I discuss this with patient placement and they also recommended that she will need an ICU bed.  After some repeated discussion with ICU, patient placement, they are now willing to take her to progressive unit.  Patient remains hemodynamically stable.

## 2020-05-03 NOTE — Plan of Care (Signed)

## 2020-05-04 ENCOUNTER — Inpatient Hospital Stay (HOSPITAL_COMMUNITY): Payer: Medicare Other

## 2020-05-04 LAB — HEMOGLOBIN AND HEMATOCRIT, BLOOD
HCT: 27.4 % — ABNORMAL LOW (ref 36.0–46.0)
Hemoglobin: 8.7 g/dL — ABNORMAL LOW (ref 12.0–15.0)

## 2020-05-04 LAB — BASIC METABOLIC PANEL
Anion gap: 8 (ref 5–15)
BUN: 9 mg/dL (ref 8–23)
CO2: 18 mmol/L — ABNORMAL LOW (ref 22–32)
Calcium: 8 mg/dL — ABNORMAL LOW (ref 8.9–10.3)
Chloride: 118 mmol/L — ABNORMAL HIGH (ref 98–111)
Creatinine, Ser: 1.11 mg/dL — ABNORMAL HIGH (ref 0.44–1.00)
GFR, Estimated: 51 mL/min — ABNORMAL LOW (ref >60)
Glucose, Bld: 101 mg/dL — ABNORMAL HIGH (ref 70–99)
Potassium: 3.6 mmol/L (ref 3.5–5.1)
Sodium: 144 mmol/L (ref 135–145)

## 2020-05-04 LAB — CBC
HCT: 27.8 % — ABNORMAL LOW (ref 36.0–46.0)
HCT: 27.7 % — ABNORMAL LOW (ref 36.0–46.0)
Hemoglobin: 9.2 g/dL — ABNORMAL LOW (ref 12.0–15.0)
Hemoglobin: 8.8 g/dL — ABNORMAL LOW (ref 12.0–15.0)
MCH: 23.1 pg — ABNORMAL LOW (ref 26.0–34.0)
MCH: 22.4 pg — ABNORMAL LOW (ref 26.0–34.0)
MCHC: 33.1 g/dL (ref 30.0–36.0)
MCHC: 31.8 g/dL (ref 30.0–36.0)
MCV: 69.7 fL — ABNORMAL LOW (ref 80.0–100.0)
MCV: 70.7 fL — ABNORMAL LOW (ref 80.0–100.0)
Platelets: 432 10*3/uL — ABNORMAL HIGH (ref 150–400)
Platelets: 402 10*3/uL — ABNORMAL HIGH (ref 150–400)
RBC: 3.99 MIL/uL (ref 3.87–5.11)
RBC: 3.92 MIL/uL (ref 3.87–5.11)
WBC: 20.8 10*3/uL — ABNORMAL HIGH (ref 4.0–10.5)
WBC: 21.1 10*3/uL — ABNORMAL HIGH (ref 4.0–10.5)
nRBC: 0.2 % (ref 0.0–0.2)
nRBC: 0.3 % — ABNORMAL HIGH (ref 0.0–0.2)

## 2020-05-04 MED ORDER — SODIUM CHLORIDE 0.9 % IV SOLN
1.0000 g | INTRAVENOUS | Status: DC
  Administered 2020-05-04 – 2020-05-06 (×3): 1 g via INTRAVENOUS
  Filled 2020-05-04 (×3): qty 10

## 2020-05-04 NOTE — Evaluation (Signed)
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   Clinical Impression   Pt typically mod I for ADL and transfers, uses RW for community, and has 3 in 1 still in box at home. Has support from husband and 2 daughters, Son, and grandchildren/great grandchildren. Today Pt is min guard for stand pivot transfers, able to don/doff socks despite pain in bottom, limited participation - question cognition problem solving, direction following and safety awareness. Pt will benefit from continued skilled OT in the acute setting as well as afterwards at the [facility] level. Next session focus on energy conservation education and encourage standing activities at sink.     Follow Up Recommendations  Home health OT;Supervision/Assistance - 24 hour (Pt might refuse HHOT)    Equipment Recommendations  None recommended by OT (Pt has appropriate DME)    Recommendations for Other Services Other (comment) (Palliative Medicine Consult)     Precautions / Restrictions Precautions Precautions: Fall Restrictions Weight Bearing Restrictions: No      Mobility Bed Mobility Overal bed mobility: Needs Assistance Bed Mobility: Supine to Sit;Sit to Supine     Supine to sit: Supervision Sit to supine: Supervision   General bed mobility comments: increased effort, comes to longsitting from flattened bed and requires increased time and effort to bring LE over side of bed    Transfers Overall transfer level: Needs assistance Equipment used: None Transfers: Sit to/from Stand Sit to Stand: Min guard         General transfer comment: min guard for  saftey with transfer to recliner and back, moves slightly erratically not waiting for proper set up of lines and leads    Balance Overall balance assessment: Needs assistance Sitting-balance support: Feet supported;No upper extremity supported;Feet unsupported Sitting balance-Leahy Scale: Fair     Standing balance support: No upper extremity supported;During functional activity Standing balance-Leahy Scale: Fair Standing balance comment: slightly unsteady but would not allow any outside assist                           ADL either performed or assessed with clinical judgement   ADL Overall ADL's : Needs assistance/impaired Eating/Feeding: Minimal assistance Eating/Feeding Details (indicate cue type and reason): Pt able to drink from water cup, had not eaten breakfast - offered to assist and Pt declined Grooming: Set up;Sitting;Wash/dry face   Upper Body Bathing: Moderate assistance;Sitting   Lower Body Bathing: Moderate assistance;Sitting/lateral leans   Upper Body Dressing : Minimal assistance;Sitting Upper Body Dressing Details (indicate cue type and reason): to don extra gown like robe Lower Body Dressing: Minimal assistance Lower Body Dressing Details (indicate cue type and reason): able to don socks EOB utlizing figure 4 method Toilet Transfer: Min Associate Professor and Hygiene: Total assistance;Bed level Toileting - Clothing Manipulation Details (indicate cue type and reason): for cleaning after brief removal     Functional mobility during ADLs: Min guard (SPT only) General ADL Comments: Pt with questionable cognition, struggled with problem solving for completion of ADL,     Vision         Perception     Praxis      Pertinent Vitals/Pain Pain Assessment: 0-10 Pain  Score: 7  Pain Location: bottom Pain Descriptors / Indicators: Sore;Aching Pain Intervention(s): Limited activity within patient's  tolerance;Monitored during session;Repositioned     Hand Dominance Right   Extremity/Trunk Assessment Upper Extremity Assessment Upper Extremity Assessment: Generalized weakness   Lower Extremity Assessment Lower Extremity Assessment: Defer to PT evaluation   Cervical / Trunk Assessment Cervical / Trunk Assessment: Kyphotic   Communication Communication Communication: No difficulties   Cognition Arousal/Alertness: Awake/alert Behavior During Therapy: Flat affect;Restless Overall Cognitive Status: Impaired/Different from baseline Area of Impairment: Following commands;Safety/judgement;Problem solving                       Following Commands: Follows one step commands inconsistently;Follows multi-step commands inconsistently;Follows multi-step commands with increased time Safety/Judgement: Decreased awareness of safety;Decreased awareness of deficits   Problem Solving: Slow processing;Decreased initiation;Requires verbal cues;Requires tactile cues;Difficulty sequencing General Comments: pt uses short answers as a foil to underlying cognition deficits, resistant to changing soiled brief and does not think she needs to be cleaned before placing a new one. asked to roll and Pt sat up EOB, struggled with problem solving and following directions   General Comments  HR 74-120s, one time HR to 180 with initiation of standing up, SaO2 100% on RA, BP 135/88    Exercises     Shoulder Instructions      Home Living Family/patient expects to be discharged to:: Private residence Living Arrangements: Spouse/significant other Available Help at Discharge: Family Type of Home: House Home Access: Stairs to enter Technical brewer of Steps: 1 Entrance Stairs-Rails: None Home Layout: One level     Bathroom Shower/Tub: Teacher, early years/pre: Standard     Home Equipment: Environmental consultant - 2 wheels;Bedside commode          Prior Functioning/Environment Level of  Independence: Independent        Comments: walking limited community distances        OT Problem List: Decreased activity tolerance;Impaired balance (sitting and/or standing);Decreased safety awareness;Decreased cognition;Pain      OT Treatment/Interventions: Self-care/ADL training;Energy conservation;DME and/or AE instruction;Therapeutic activities;Patient/family education;Balance training    OT Goals(Current goals can be found in the care plan section) Acute Rehab OT Goals Patient Stated Goal: go home OT Goal Formulation: With patient Time For Goal Achievement: 05/18/20 Potential to Achieve Goals: Fair ADL Goals Pt Will Perform Grooming: with modified independence;standing Pt Will Perform Upper Body Dressing: with modified independence;sitting Pt Will Perform Lower Body Dressing: with modified independence;sit to/from stand Pt Will Transfer to Toilet: with modified independence;ambulating Pt Will Perform Toileting - Clothing Manipulation and hygiene: with modified independence;sit to/from stand Additional ADL Goal #1: Pt will verbalize 3 ways of conserving energy during ADL with no cues  OT Frequency: Min 2X/week   Barriers to D/C:            Co-evaluation PT/OT/SLP Co-Evaluation/Treatment: Yes Reason for Co-Treatment: Necessary to address cognition/behavior during functional activity;To address functional/ADL transfers PT goals addressed during session: Mobility/safety with mobility;Balance;Strengthening/ROM OT goals addressed during session: ADL's and self-care;Strengthening/ROM      AM-PAC OT "6 Clicks" Daily Activity     Outcome Measure Help from another person eating meals?: None Help from another person taking care of personal grooming?: A Little Help from another person toileting, which includes using toliet, bedpan, or urinal?: A Lot Help from another person bathing (including washing, rinsing, drying)?: A Lot Help from another person to put on and taking off  regular upper body clothing?: A Little Help  from another person to put on and taking off regular lower body clothing?: A Little 6 Click Score: 17   End of Session Nurse Communication: Mobility status  Activity Tolerance: Patient limited by fatigue;Patient tolerated treatment well Patient left: in bed;with call bell/phone within reach;with bed alarm set  OT Visit Diagnosis: Unsteadiness on feet (R26.81);Muscle weakness (generalized) (M62.81);Other symptoms and signs involving cognitive function                Time: 6728-9791 OT Time Calculation (min): 31 min Charges:  OT General Charges $OT Visit: 1 Visit OT Evaluation $OT Eval Moderate Complexity: [place] OTR/L Acute Rehabilitation Services Pager: [phone] Office: [place] 05/04/2020, 10:51 AM

## 2020-05-04 NOTE — Progress Notes (Signed)
RN came in with morning medications, noticed patient hadn't eaten her breakfast and asked if she would like to be set up to eat and she refused. I then told her I had her morning medications and she said she didn't want them, I asked for a reason and she said "I just don't want them." I asked the patient and she said "yes please" so I left to let her rest.

## 2020-05-04 NOTE — Progress Notes (Addendum)
PROGRESS NOTE    Shannon Terry  YQM:[id number] DOB: [birth year] DOA: 05/02/2020 PCP: Patient, No Pcp Per   Brief Narrative: 80 year old with past medical history significant for history of rectal GIST diagnosed 2014 previously followed by Dr. Marin Olp, hypertension who presented with rectal bleeding and increased rectal pain.   About 2 days prior to admission patient noticed 2 days of bright red blood per rectum with large clots.  This has since resolved but she presents to the emergency department complaining of increased rectal pain.  Of note patient was diagnosed with rectal GIST back in 2014, initially follow with oncology and treated with imatinib and Gleevec.  Patient subsequently decided not to pursue any treatment for her malignancy.  She has had numerous hospitalization in the past for anemia and GI bleed and refused work-up.  She also refused palliative care.   Assessment & Plan:   Principal Problem:   Acute blood loss anemia Active Problems:   Malignant GIST (HCC)   Hypertension   AKI (acute kidney injury) ([place])   Malignant cachexia (HCC)   Hemiplegia and hemiparesis following cerebral infarction affecting left non-dominant side (HCC)   Congestive heart failure, unspecified HF chronicity, unspecified heart failure type ([place])   1-Acute blood loss Anemia from rectal bleeding/history of Rectal GIST;  Severe  anemia: Patient presented with a hemoglobin of 2.2.  One Year ago her hemoglobin was 10.6. Patient has received 4 units of packed red blood cells during this admission.  I have started patient on iron and B12 supplement. Patient had 2 Bloody BM yesterday afternoon and one overnight.  She decline Endoscopy/ GI evaluation.  Continue with support care, IV fluids and Blood transfusion.  Hb this am 8.8 Monitor overnight.   2-AKI on CKD stage IIIb Secondary to acute blood loss anemia Renal function pending for today/   3-Hypertension: Monitor for now due to low  hemoglobin  4-Rectal GIST: On prior CT scan 2017 tumor size was 8 cm. Patient does not want to pursue any further treatment. Declined palliative care. She report her pain has improved.  Continue with pain medication. Had rectal bleeding yesterday afternoon and overnight. Hb stable.  Transfusion as needed.   5-Hypokalemia: replaced. Awaiting labs.   6-Leukocytosis;  Report Dysuria. Check UA, urine culture. Start IV ceftriaxone.  Will check chest x ray, if evidence of PNA> will add Azithromycin.  Addendum; chest x ray with mild pulmonary congestin. Stop IV fluids.   Estimated body mass index is 23.8 kg/m as calculated from the following:   Height as of this encounter: 5\' 4"  (1.626 m).   Weight as of this encounter: 62.9 kg.   DVT prophylaxis: SCD Code Status: DNR, per patient wishes.  Family Communication: Daughter and husband who were at bedside.   Disposition Plan:  Status is: Inpatient  Remains inpatient appropriate because:IV treatments appropriate due to intensity of illness or inability to take PO   Dispo: The patient is from: Home              Anticipated d/c is to: Home              Patient currently is not medically stable to d/c.   Difficult to place patient No        Consultants:   None   Procedures:   None  Antimicrobials:    Subjective: She doesn't know if she has had BM this am, per nurse report no BM this am. Had one BM overnight after 7 PM Report dysuria.  Objective: Vitals:   05/03/20 2000 05/03/20 2334 05/04/20 0337 05/04/20 0802  BP: (!) 158/82 (!) 161/80 (!) 151/74 (!) 174/90  Pulse: 80 85 88 88  Resp: 14 15 15 18   Temp: 98.6 F (37 C) 98.1 F (36.7 C) 98 F (36.7 C) 98.9 F (37.2 C)  TempSrc: Oral Oral Oral Oral  SpO2: 98% 100% 99% 95%  Weight:      Height:        Intake/Output Summary (Last 24 hours) at 05/04/2020 1041 Last data filed at 05/04/2020 0715 Gross per 24 hour  Intake 300.42 ml  Output --  Net 300.42 ml    Filed Weights   05/02/20 2047 05/03/20 0331  Weight: 66 kg 62.9 kg    Examination:  General exam: NAD Respiratory system: CTA Cardiovascular system: S 1, S 2 RRR Gastrointestinal system: BS resent, soft, nt Central nervous system: Alert Extremities: no edema    Data Reviewed: I have personally reviewed following labs and imaging studies  CBC: Recent Labs  Lab 05/02/20 2216 05/03/20 0707 05/03/20 1610 05/03/20 2017 05/04/20 0053 05/04/20 0754  WBC 14.6* 18.7*  --  19.9* 20.8* 21.1*  NEUTROABS 11.7*  --   --   --   --   --   HGB 2.2* 5.4* 9.1* 8.9* 9.2* 8.8*  HCT 9.3* 18.1* 28.4* 26.5* 27.8* 27.7*  MCV 50.8* 65.1*  --  69.6* 69.7* 70.7*  PLT 522* 505*  --  411* 432* 751*   Basic Metabolic Panel: Recent Labs  Lab 05/02/20 2021 05/03/20 0707  NA 141 144  K 3.7 3.4*  CL 114* 116*  CO2 17* 18*  GLUCOSE 97 102*  BUN 19 14  CREATININE 1.42* 1.30*  CALCIUM 7.7* 7.8*   GFR: Estimated Creatinine Clearance: 31.3 mL/min (A) (by C-G formula based on SCr of 1.3 mg/dL (H)). Liver Function Tests: Recent Labs  Lab 05/02/20 2021  AST 19  ALT 15  ALKPHOS 76  BILITOT 0.7  PROT 5.2*  ALBUMIN 2.5*   No results for input(s): LIPASE, AMYLASE in the last 168 hours. No results for input(s): AMMONIA in the last 168 hours. Coagulation Profile: Recent Labs  Lab 05/02/20 2021  INR 1.4*   Cardiac Enzymes: No results for input(s): CKTOTAL, CKMB, CKMBINDEX, TROPONINI in the last 168 hours. BNP (last 3 results) No results for input(s): PROBNP in the last 8760 hours. HbA1C: No results for input(s): HGBA1C in the last 72 hours. CBG: No results for input(s): GLUCAP in the last 168 hours. Lipid Profile: No results for input(s): CHOL, HDL, LDLCALC, TRIG, CHOLHDL, LDLDIRECT in the last 72 hours. Thyroid Function Tests: No results for input(s): TSH, T4TOTAL, FREET4, T3FREE, THYROIDAB in the last 72 hours. Anemia Panel: No results for input(s): VITAMINB12, FOLATE, FERRITIN,  TIBC, IRON, RETICCTPCT in the last 72 hours. Sepsis Labs: No results for input(s): PROCALCITON, LATICACIDVEN in the last 168 hours.  Recent Results (from the past 240 hour(s))  SARS CORONAVIRUS 2 (TAT 6-24 HRS) Nasopharyngeal Nasopharyngeal Swab     Status: None   Collection Time: 05/02/20 11:51 PM   Specimen: Nasopharyngeal Swab  Result Value Ref Range Status   SARS Coronavirus 2 NEGATIVE NEGATIVE Final    Comment: (NOTE) SARS-CoV-2 target nucleic acids are NOT DETECTED.  The SARS-CoV-2 RNA is generally detectable in upper and lower respiratory specimens during the acute phase of infection. Negative results do not preclude SARS-CoV-2 infection, do not rule out co-infections with other pathogens, and should not be used as the sole basis  for treatment or other patient management decisions. Negative results must be combined with clinical observations, patient history, and epidemiological information. The expected result is Negative.  Fact Sheet for Patients: SugarRoll.be  Fact Sheet for Healthcare Providers: https://www.woods-mathews.com/  This test is not yet approved or cleared by the [place] FDA and  has been authorized for detection and/or diagnosis of SARS-CoV-2 by FDA under an Emergency Use Authorization (EUA). This EUA will remain  in effect (meaning this test can be used) for the duration of the COVID-19 declaration under Se ction 564(b)(1) of the Act, 21 U.S.C. section 360bbb-3(b)(1), unless the authorization is terminated or revoked sooner.  Performed at [facility], [place] [address], [place], [place] [address]          Radiology Studies: No results found.      Scheduled Meds: . sodium chloride   Intravenous Once  . sodium chloride   Intravenous Once  . ferrous sulfate  325 mg Oral Q breakfast  . folic acid  1 mg Oral Daily  . hydrocortisone cream   Topical BID  . vitamin B-12  100 mcg Oral Daily    Continuous Infusions: . sodium chloride 50 mL/hr at 05/03/20 1509  . cefTRIAXone (ROCEPHIN)  IV       LOS: 1 day    Time spent: 35 minutes.     Elmarie Shiley, MD [facility]   If 7PM-7AM, please contact night-coverage [email or web address]  05/04/2020, 10:41 AM

## 2020-05-04 NOTE — Evaluation (Signed)
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  Clinical Impression  PTA pt living with husband of 1 years, in single story home with 1 step to enter. Pt reports independence in ambulation without AD, although also states she has a RW that she sometimes uses. Pt is limited in mobility by decreased safety awareness, generalized weakness, decreased balance and endurance. Pt self limiting in today's Evaluation only participating in minimal mobility up to the chair and after sitting for 5 min requests to get back to bed. Given pt's poor safety awareness assisted back to bed. Pt is supervision for bed mobility and min guard for transfers and very short distance ambulation. PT recommending HHPT with return home to improve strength and endurance. PT will continue to follow acutely.     Follow Up Recommendations Home health PT;Supervision for mobility/OOB    Equipment Recommendations  None recommended by PT       Precautions / Restrictions Precautions Precautions: Fall Restrictions Weight Bearing Restrictions: No      Mobility  Bed Mobility Overal bed mobility: Needs Assistance Bed Mobility: Supine to Sit;Sit to Supine     Supine to sit: Supervision Sit to supine: Supervision   General bed mobility comments: increased effort, comes to longsitting from flattened bed and requires increased time and effort to bring LE over side of bed    Transfers Overall transfer level: Needs assistance Equipment used: None Transfers: Sit to/from Stand Sit to Stand: Min guard         General transfer comment: min guard for saftey with  transfer to recliner and back, moves slightly erratically not waiting for proper set up of lines and leads  Ambulation/Gait Ambulation/Gait assistance: Min guard Gait Distance (Feet): 3 Feet (x2) Assistive device: None Gait Pattern/deviations: Step-to pattern;Decreased step length - right;Decreased step length - left;Shuffle;Trunk flexed Gait velocity: slowed Gait velocity interpretation: <1.31 ft/sec, indicative of household ambulator General Gait Details: slow, shuffling steps to and from recliner, mildly unsteady, no overt LoB      Balance Overall balance assessment: Needs assistance Sitting-balance support: Feet supported;No upper extremity supported;Feet unsupported Sitting balance-Leahy Scale: Fair     Standing balance support: No upper extremity supported;During functional activity Standing balance-Leahy Scale: Fair Standing balance comment: slightly unsteady but would not allow any outside assist                             Pertinent Vitals/Pain Pain Assessment: 0-10 Pain Score: 7  Pain Location: bottom Pain Descriptors / Indicators: Sore;Aching Pain Intervention(s): Limited activity within patient's tolerance;Monitored during session;Repositioned    Home Living Family/patient expects to be discharged to:: Private residence Living Arrangements: Spouse/significant other Available Help at Discharge: Family Type of Home: House Home Access: Stairs to enter Entrance Stairs-Rails: None Technical brewer of Steps: 1 Home Layout: One level Home Equipment: Environmental consultant - 2 wheels      Prior Function Level of Independence: Independent         Comments: walking limited community distances     Hand Dominance   Dominant Hand: Right    Extremity/Trunk Assessment   Upper Extremity Assessment Upper Extremity Assessment: Defer to OT evaluation    Lower Extremity  Assessment Lower Extremity Assessment: Generalized weakness    Cervical / Trunk  Assessment Cervical / Trunk Assessment: Kyphotic  Communication   Communication: No difficulties  Cognition Arousal/Alertness: Awake/alert Behavior During Therapy: Flat affect;Restless Overall Cognitive Status: Impaired/Different from baseline Area of Impairment: Following commands;Safety/judgement;Problem solving                       Following Commands: Follows one step commands inconsistently;Follows multi-step commands inconsistently;Follows multi-step commands with increased time Safety/Judgement: Decreased awareness of safety;Decreased awareness of deficits   Problem Solving: Slow processing;Decreased initiation;Requires verbal cues;Requires tactile cues;Difficulty sequencing General Comments: pt uses short answers as a foil to underlying cognition deficits, resistant to changing soiled brief and does not think she needs to be cleaned before placing a new one.      General Comments General comments (skin integrity, edema, etc.): HR 74-120s, one time HR to 180 with initiation of standing up, SaO2 100% on RA, BP 135/88        Assessment/Plan    PT Assessment Patient needs continued PT services  PT Problem List Decreased strength;Decreased activity tolerance;Decreased balance;Decreased mobility;Decreased cognition;Decreased safety awareness;Pain       PT Treatment Interventions DME instruction;Gait training;Stair training;Functional mobility training;Therapeutic activities;Therapeutic exercise;Balance training;Cognitive remediation;Patient/family education    PT Goals (Current goals can be found in the Care Plan section)  Acute Rehab PT Goals Patient Stated Goal: go home PT Goal Formulation: With patient Time For Goal Achievement: 05/18/20 Potential to Achieve Goals: Fair    Frequency Min 3X/week        Co-evaluation PT/OT/SLP Co-Evaluation/Treatment: Yes Reason for Co-Treatment: Necessary to address cognition/behavior during functional activity PT goals  addressed during session: Mobility/safety with mobility         AM-PAC PT "6 Clicks" Mobility  Outcome Measure Help needed turning from your back to your side while in a flat bed without using bedrails?: None Help needed moving from lying on your back to sitting on the side of a flat bed without using bedrails?: None Help needed moving to and from a bed to a chair (including a wheelchair)?: A Little Help needed standing up from a chair using your arms (e.g., wheelchair or bedside chair)?: A Little Help needed to walk in hospital room?: A Little Help needed climbing 3-5 steps with a railing? : A Little 6 Click Score: 20    End of Session   Activity Tolerance: Patient limited by pain;Treatment limited secondary to agitation Patient left: in bed;with call bell/phone within reach;with bed alarm set Nurse Communication: Mobility status (need to check for incontinence) PT Visit Diagnosis: Unsteadiness on feet (R26.81);Other abnormalities of gait and mobility (R26.89);Difficulty in walking, not elsewhere classified (R26.2);Pain Pain - part of body:  (bottom)    Time: 6578-4696 PT Time Calculation (min) (ACUTE ONLY): 27 min   Charges:   PT Evaluation $PT Eval Moderate Complexity: 1 Mod          Donatello Kleve B. Migdalia Dk PT, DPT Acute Rehabilitation Services Pager [phone] Office [phone]   [place] 05/04/2020, 10:37 AM

## 2020-05-04 NOTE — Plan of Care (Signed)
  Problem: Health Behavior/Discharge Planning: Goal: Ability to manage health-related needs will improve Outcome: Progressing   Problem: Clinical Measurements: Goal: Respiratory complications will improve Outcome: Progressing Goal: Cardiovascular complication will be avoided Outcome: Progressing

## 2020-05-05 ENCOUNTER — Inpatient Hospital Stay (HOSPITAL_COMMUNITY): Payer: Medicare Other

## 2020-05-05 DIAGNOSIS — R9431 Abnormal electrocardiogram [ECG] [EKG]: Secondary | ICD-10-CM | POA: Diagnosis not present

## 2020-05-05 LAB — ECHOCARDIOGRAM COMPLETE
Area-P 1/2: 2.62 cm2
Height: 64 in
S' Lateral: 3.6 cm
Weight: 2218.71 oz

## 2020-05-05 LAB — BASIC METABOLIC PANEL
Anion gap: 10 (ref 5–15)
BUN: 10 mg/dL (ref 8–23)
CO2: 16 mmol/L — ABNORMAL LOW (ref 22–32)
Calcium: 8.1 mg/dL — ABNORMAL LOW (ref 8.9–10.3)
Chloride: 116 mmol/L — ABNORMAL HIGH (ref 98–111)
Creatinine, Ser: 1.11 mg/dL — ABNORMAL HIGH (ref 0.44–1.00)
GFR, Estimated: 51 mL/min — ABNORMAL LOW (ref >60)
Glucose, Bld: 75 mg/dL (ref 70–99)
Potassium: 3.3 mmol/L — ABNORMAL LOW (ref 3.5–5.1)
Sodium: 142 mmol/L (ref 135–145)

## 2020-05-05 LAB — CBC
HCT: 27.2 % — ABNORMAL LOW (ref 36.0–46.0)
Hemoglobin: 8.8 g/dL — ABNORMAL LOW (ref 12.0–15.0)
MCH: 23.1 pg — ABNORMAL LOW (ref 26.0–34.0)
MCHC: 32.4 g/dL (ref 30.0–36.0)
MCV: 71.4 fL — ABNORMAL LOW (ref 80.0–100.0)
Platelets: 347 10*3/uL (ref 150–400)
RBC: 3.81 MIL/uL — ABNORMAL LOW (ref 3.87–5.11)
WBC: 17.6 10*3/uL — ABNORMAL HIGH (ref 4.0–10.5)
nRBC: 0.6 % — ABNORMAL HIGH (ref 0.0–0.2)

## 2020-05-05 MED ORDER — MIRTAZAPINE 15 MG PO TBDP
7.5000 mg | ORAL_TABLET | Freq: Every day | ORAL | Status: DC
  Administered 2020-05-05 – 2020-05-06 (×2): 7.5 mg via ORAL
  Filled 2020-05-05 (×3): qty 0.5

## 2020-05-05 MED ORDER — CARVEDILOL 3.125 MG PO TABS
3.1250 mg | ORAL_TABLET | Freq: Two times a day (BID) | ORAL | Status: DC
  Administered 2020-05-05 – 2020-05-07 (×4): 3.125 mg via ORAL
  Filled 2020-05-05 (×5): qty 1

## 2020-05-05 MED ORDER — FUROSEMIDE 10 MG/ML IJ SOLN
40.0000 mg | Freq: Once | INTRAMUSCULAR | Status: AC
  Administered 2020-05-05: 40 mg via INTRAVENOUS
  Filled 2020-05-05: qty 4

## 2020-05-05 MED ORDER — POTASSIUM CHLORIDE CRYS ER 20 MEQ PO TBCR
40.0000 meq | EXTENDED_RELEASE_TABLET | Freq: Once | ORAL | Status: AC
  Administered 2020-05-05: 40 meq via ORAL
  Filled 2020-05-05: qty 2

## 2020-05-05 NOTE — Progress Notes (Signed)
  Echocardiogram 2D Echocardiogram has been performed.  Shannon Terry 05/05/2020, 1:23 PM

## 2020-05-05 NOTE — Plan of Care (Signed)

## 2020-05-05 NOTE — Progress Notes (Signed)
Patient had a roughly 5 min run of SVT from 1352-1357. RN went to check on the patient and the patient was asymptomatic and was unaware of her HR change. Dr. Eliseo Squires notified and added orders for 3.125 mg coreg. RN will continue to monitor.

## 2020-05-05 NOTE — Progress Notes (Signed)
Progress Note    Shannon Terry  OAC:[id number] DOB: Apr 01, 1940  DOA: 05/02/2020 PCP: Patient, No Pcp Per    Brief Narrative:    Medical records reviewed and are as summarized below:  Shannon Terry is an 80 y.o. female with medical history significant for Hx of Rectal GIST dx 2014 previously followed by oncology, HTN who presents with rectal bleeding and increased rectal pain.  About 2 days ago she noticed 2 days of bright red blood per rectum with large clots.  This has since resolved but she presents today given increased rectal pain.  Evidently, patient was diagnosed with renal GIST back in 2014 and initially was following oncology outpatient and was treated on imatinib for some time with poor follow-up and eventually has not seen oncology since 2016.  She has had numerous hospitalizations in the past for acute blood loss anemia due to her rectal mass and refused further work-up.  Assessment/Plan:   Principal Problem:   Acute blood loss anemia Active Problems:   Malignant GIST (HCC)   Hypertension   AKI (acute kidney injury) ([place])   Malignant cachexia (HCC)   Hemiplegia and hemiparesis following cerebral infarction affecting left non-dominant side (HCC)   Congestive heart failure, unspecified HF chronicity, unspecified heart failure type ([place])   Acute blood loss Anemia from rectal bleeding/history of Rectal GIST;  Severe  anemia: Patient presented with a hemoglobin of 2.2.  One Year ago her hemoglobin was 10.6. s/p 4 units of packed red blood cells during this admission.  -monitor bowel movements -check Fe levels inAM  Rectal GIST: On prior CT scan 2017 tumor size was 8 cm. Patient does not want to pursue any further treatment. Declined palliative care. -monitor for bleeding  Volume overload -prob due to blood transfusions -IV lasix x 1 -check echo  Hypokalemia: replace   Leukocytosis;  Report Dysuria.  -Check UA, urine culture.  -Start IV ceftriaxone.    FTT -may need to revisit palliative care -start remeron -encourage mobilization -check vit d  Family Communication/Anticipated D/C date and plan/Code Status   DVT prophylaxis: Lovenox ordered. Code Status: dnr Family Communication: daughter Investment banker, corporate) at bedside, husband present (has dementia) Disposition Plan: Status is: Inpatient  Remains inpatient appropriate because:Inpatient level of care appropriate due to severity of illness   Dispo: The patient is from: Home              Anticipated d/c is to: tbd              Patient currently is not medically stable to d/c.   Difficult to place patient No         Medical Consultants:    None- patient refused    Subjective:   Says she just does not want to eat  Objective:    Vitals:   05/04/20 2300 05/05/20 0300 05/05/20 0730 05/05/20 1115  BP: (!) 147/69 118/66 122/70 (!) 146/74  Pulse: 91 89 95 92  Resp: 15 16 17 15   Temp: 98.2 F (36.8 C) 98.3 F (36.8 C) (!) 97.4 F (36.3 C) 97.8 F (36.6 C)  TempSrc: Oral Oral Oral Oral  SpO2: 98% 99% 100% 98%  Weight:      Height:        Intake/Output Summary (Last 24 hours) at 05/05/2020 1218 Last data filed at 05/04/2020 1500 Gross per 24 hour  Intake 1100 ml  Output --  Net 1100 ml   Filed Weights   05/02/20 2047 05/03/20 0331  Weight:  66 kg 62.9 kg    Exam:  General: Appearance:    elderly female in no acute distress     Lungs:     respirations unlabored  Heart:    Normal heart rate. Normal rhythm. No murmurs, rubs, or gallops.   MS:   All extremities are intact.   Neurologic:   Awake, alert- withdrawn    Data Reviewed:   I have personally reviewed following labs and imaging studies:  Labs: Labs show the following:   Basic Metabolic Panel: Recent Labs  Lab 05/02/20 2021 05/03/20 0707 05/04/20 0754 05/05/20 0102  NA 141 144 144 142  K 3.7 3.4* 3.6 3.3*  CL 114* 116* 118* 116*  CO2 17* 18* 18* 16*  GLUCOSE 97 102* 101* 75  BUN 19 14 9 10    CREATININE 1.42* 1.30* 1.11* 1.11*  CALCIUM 7.7* 7.8* 8.0* 8.1*   GFR Estimated Creatinine Clearance: 36.7 mL/min (A) (by C-G formula based on SCr of 1.11 mg/dL (H)). Liver Function Tests: Recent Labs  Lab 05/02/20 2021  AST 19  ALT 15  ALKPHOS 76  BILITOT 0.7  PROT 5.2*  ALBUMIN 2.5*   No results for input(s): LIPASE, AMYLASE in the last 168 hours. No results for input(s): AMMONIA in the last 168 hours. Coagulation profile Recent Labs  Lab 05/02/20 2021  INR 1.4*    CBC: Recent Labs  Lab 05/02/20 2216 05/03/20 0707 05/03/20 1610 05/03/20 2017 05/04/20 0053 05/04/20 0754 05/04/20 1940  WBC 14.6* 18.7*  --  19.9* 20.8* 21.1*  --   NEUTROABS 11.7*  --   --   --   --   --   --   HGB 2.2* 5.4* 9.1* 8.9* 9.2* 8.8* 8.7*  HCT 9.3* 18.1* 28.4* 26.5* 27.8* 27.7* 27.4*  MCV 50.8* 65.1*  --  69.6* 69.7* 70.7*  --   PLT 522* 505*  --  411* 432* 402*  --    Cardiac Enzymes: No results for input(s): CKTOTAL, CKMB, CKMBINDEX, TROPONINI in the last 168 hours. BNP (last 3 results) No results for input(s): PROBNP in the last 8760 hours. CBG: No results for input(s): GLUCAP in the last 168 hours. D-Dimer: No results for input(s): DDIMER in the last 72 hours. Hgb A1c: No results for input(s): HGBA1C in the last 72 hours. Lipid Profile: No results for input(s): CHOL, HDL, LDLCALC, TRIG, CHOLHDL, LDLDIRECT in the last 72 hours. Thyroid function studies: No results for input(s): TSH, T4TOTAL, T3FREE, THYROIDAB in the last 72 hours.  Invalid input(s): FREET3 Anemia work up: No results for input(s): VITAMINB12, FOLATE, FERRITIN, TIBC, IRON, RETICCTPCT in the last 72 hours. Sepsis Labs: Recent Labs  Lab 05/03/20 0707 05/03/20 2017 05/04/20 0053 05/04/20 0754  WBC 18.7* 19.9* 20.8* 21.1*    Microbiology Recent Results (from the past 240 hour(s))  SARS CORONAVIRUS 2 (TAT 6-24 HRS) Nasopharyngeal Nasopharyngeal Swab     Status: None   Collection Time: 05/02/20 11:51 PM    Specimen: Nasopharyngeal Swab  Result Value Ref Range Status   SARS Coronavirus 2 NEGATIVE NEGATIVE Final    Comment: (NOTE) SARS-CoV-2 target nucleic acids are NOT DETECTED.  The SARS-CoV-2 RNA is generally detectable in upper and lower respiratory specimens during the acute phase of infection. Negative results do not preclude SARS-CoV-2 infection, do not rule out co-infections with other pathogens, and should not be used as the sole basis for treatment or other patient management decisions. Negative results must be combined with clinical observations, patient history, and epidemiological information.  The expected result is Negative.  Fact Sheet for Patients: SugarRoll.be  Fact Sheet for Healthcare Providers: https://www.woods-mathews.com/  This test is not yet approved or cleared by the [place] FDA and  has been authorized for detection and/or diagnosis of SARS-CoV-2 by FDA under an Emergency Use Authorization (EUA). This EUA will remain  in effect (meaning this test can be used) for the duration of the COVID-19 declaration under Se ction 564(b)(1) of the Act, 21 U.S.C. section 360bbb-3(b)(1), unless the authorization is terminated or revoked sooner.  Performed at [facility], [place] [address] [place], [place] [address]     Procedures and diagnostic studies:  DG CHEST PORT 1 VIEW  Result Date: 05/04/2020 CLINICAL DATA:  Leukocytosis. EXAM: PORTABLE CHEST 1 VIEW COMPARISON:  One-view chest x-ray 11/11/18 FINDINGS: The heart is mildly enlarged. Lung volumes are low. Moderate pulmonary vascular congestion is present without frank edema. Mild dependent atelectasis is present. No other significant airspace disease is present. No definite effusions are present. Axial skeleton is unremarkable. IMPRESSION: 1. Mild cardiomegaly and moderate pulmonary vascular congestion without frank edema. 2. Low lung volumes. Electronically Signed    By: San Morelle M.D.   On: 05/04/2020 12:25    Medications:   . sodium chloride   Intravenous Once  . sodium chloride   Intravenous Once  . folic acid  1 mg Oral Daily  . furosemide  40 mg Intravenous Once  . hydrocortisone cream   Topical BID  . mirtazapine  7.5 mg Oral QHS  . potassium chloride  40 mEq Oral Once  . vitamin B-12  100 mcg Oral Daily   Continuous Infusions: . cefTRIAXone (ROCEPHIN)  IV 1 g (05/05/20 1118)     LOS: 2 days   Geradine Girt  [facility]   How to contact the [facility] Attending or Consulting provider [place] or covering provider during after hours [place], for this patient?  1. Check the care team in [facility] and look for a) attending/consulting [facility] provider listed and b) the [facility] team listed 2. Log into [email or web address] and use [place]'s universal password to access. If you do not have the password, please contact the hospital operator. 3. Locate the [facility] provider you are looking for under [facility] and page to a number that you can be directly reached. 4. If you still have difficulty reaching the provider, please page the [facility] (Director on Call) for the Hospitalists listed on amion for assistance.  05/05/2020, 12:18 PM

## 2020-05-06 LAB — BPAM RBC
Blood Product Expiration Date: 202203132359
Blood Product Expiration Date: 202203132359
Blood Product Expiration Date: 202203142359
Blood Product Expiration Date: 202203172359
Blood Product Expiration Date: 202203172359
ISSUE DATE / TIME: 202202272324
ISSUE DATE / TIME: 202202280236
ISSUE DATE / TIME: 202202280835
ISSUE DATE / TIME: 202202281133
Unit Type and Rh: 7300
Unit Type and Rh: 7300
Unit Type and Rh: 7300
Unit Type and Rh: 7300
Unit Type and Rh: 7300

## 2020-05-06 LAB — TYPE AND SCREEN
ABO/RH(D): B POS
Antibody Screen: NEGATIVE
Unit division: 0
Unit division: 0
Unit division: 0
Unit division: 0
Unit division: 0

## 2020-05-06 LAB — CBC
HCT: 28.8 % — ABNORMAL LOW (ref 36.0–46.0)
Hemoglobin: 9.3 g/dL — ABNORMAL LOW (ref 12.0–15.0)
MCH: 23 pg — ABNORMAL LOW (ref 26.0–34.0)
MCHC: 32.3 g/dL (ref 30.0–36.0)
MCV: 71.3 fL — ABNORMAL LOW (ref 80.0–100.0)
Platelets: 337 10*3/uL (ref 150–400)
RBC: 4.04 MIL/uL (ref 3.87–5.11)
WBC: 15.2 10*3/uL — ABNORMAL HIGH (ref 4.0–10.5)
nRBC: 0.4 % — ABNORMAL HIGH (ref 0.0–0.2)

## 2020-05-06 LAB — BASIC METABOLIC PANEL
Anion gap: 8 (ref 5–15)
BUN: 9 mg/dL (ref 8–23)
CO2: 20 mmol/L — ABNORMAL LOW (ref 22–32)
Calcium: 8.1 mg/dL — ABNORMAL LOW (ref 8.9–10.3)
Chloride: 114 mmol/L — ABNORMAL HIGH (ref 98–111)
Creatinine, Ser: 1.33 mg/dL — ABNORMAL HIGH (ref 0.44–1.00)
GFR, Estimated: 41 mL/min — ABNORMAL LOW (ref >60)
Glucose, Bld: 105 mg/dL — ABNORMAL HIGH (ref 70–99)
Potassium: 3.7 mmol/L (ref 3.5–5.1)
Sodium: 142 mmol/L (ref 135–145)

## 2020-05-06 LAB — IRON AND TIBC
Iron: 14 ug/dL — ABNORMAL LOW (ref 28–170)
Saturation Ratios: 6 % — ABNORMAL LOW (ref 10.4–31.8)
TIBC: 242 ug/dL — ABNORMAL LOW (ref 250–450)
UIBC: 228 ug/dL

## 2020-05-06 LAB — FERRITIN: Ferritin: 18 ng/mL (ref 11–307)

## 2020-05-06 LAB — VITAMIN D 25 HYDROXY (VIT D DEFICIENCY, FRACTURES): Vit D, 25-Hydroxy: 10.43 ng/mL — ABNORMAL LOW (ref 30–100)

## 2020-05-06 LAB — PREALBUMIN: Prealbumin: 5.4 mg/dL — ABNORMAL LOW (ref 18–38)

## 2020-05-06 NOTE — Progress Notes (Signed)
Physical Therapy Treatment Patient Details Name: Shannon Terry MRN: [id number] DOB: 06/01/1940 Today's Date: 05/06/2020    History of Present Illness 80 year old with a PMH including Acute renal failure, Anasarca, Anemia, iron deficiency, Gastritis without bleeding, Hematochezia, Hemorrhoids, Malignant GIST(05/30/2012), Obesity, and Rectal cancer (04/03/12) who presented with rectal bleeding and increased rectal pain.    PT Comments    Pt family in room and she reports feeling much better today, agreeable to walk with therapy. Pt relates her increased desire to go home. Pt is limited in safe mobility by decreased weakness and c/o pins and needles pain in bottoms of her feet with weightbearing. Pt is supervision for bed mobility, and min guard for transfers and ambulation with Rollator. Pt would benefit from Rollator to improve her range with ambulation with d/c home. PT will continue to follow acutely.   Follow Up Recommendations  Home health PT;Supervision for mobility/OOB     Equipment Recommendations  Other (comment) (Rollator)       Precautions / Restrictions Precautions Precautions: Fall Restrictions Weight Bearing Restrictions: No    Mobility  Bed Mobility Overal bed mobility: Needs Assistance Bed Mobility: Supine to Sit;Sit to Supine     Supine to sit: Supervision Sit to supine: Supervision   General bed mobility comments: increased effort, comes to longsitting from flattened bed and requires increased time and effort to bring LE over side of bed    Transfers Overall transfer level: Needs assistance Equipment used: None Transfers: Sit to/from Stand Sit to Stand: Min guard         General transfer comment: min guard for safety, education on use of RW and brakes  Ambulation/Gait Ambulation/Gait assistance: Min guard Gait Distance (Feet): 30 Feet Assistive device: None Gait Pattern/deviations: Step-to pattern;Decreased step length - right;Decreased step length -  left;Shuffle;Trunk flexed Gait velocity: slowed Gait velocity interpretation: <1.31 ft/sec, indicative of household ambulator General Gait Details: min guard for safety, limited by pins and needles pain in feet         Balance Overall balance assessment: Needs assistance Sitting-balance support: Feet supported;No upper extremity supported;Feet unsupported Sitting balance-Leahy Scale: Fair     Standing balance support: No upper extremity supported;During functional activity Standing balance-Leahy Scale: Fair Standing balance comment: slightly unsteady but would not allow any outside assist                            Cognition Arousal/Alertness: Awake/alert Behavior During Therapy: Flat affect Overall Cognitive Status: Impaired/Different from baseline Area of Impairment: Following commands;Safety/judgement;Problem solving                       Following Commands: Follows one step commands inconsistently;Follows multi-step commands inconsistently;Follows multi-step commands with increased time Safety/Judgement: Decreased awareness of safety;Decreased awareness of deficits   Problem Solving: Slow processing;Decreased initiation;Requires verbal cues;Requires tactile cues;Difficulty sequencing General Comments: allows daughter to answer for her         General Comments General comments (skin integrity, edema, etc.): VSS on RA, daughter and husband in room      Pertinent Vitals/Pain Pain Assessment: Faces Faces Pain Scale: Hurts little more Pain Location: bottoms of her feet Pain Descriptors / Indicators: Sore;Aching;Pins and needles Pain Intervention(s): Limited activity within patient's tolerance;Monitored during session;Repositioned           PT Goals (current goals can now be found in the care plan section) Acute Rehab PT Goals Patient Stated Goal: go home PT  Goal Formulation: With patient Time For Goal Achievement: 05/18/20 Potential to Achieve  Goals: Fair Progress towards PT goals: Progressing toward goals    Frequency    Min 3X/week      PT Plan Equipment recommendations need to be updated       AM-PAC PT "6 Clicks" Mobility   Outcome Measure  Help needed turning from your back to your side while in a flat bed without using bedrails?: None Help needed moving from lying on your back to sitting on the side of a flat bed without using bedrails?: None Help needed moving to and from a bed to a chair (including a wheelchair)?: A Little Help needed standing up from a chair using your arms (e.g., wheelchair or bedside chair)?: A Little Help needed to walk in hospital room?: A Little Help needed climbing 3-5 steps with a railing? : A Little 6 Click Score: 20    End of Session   Activity Tolerance: Patient limited by pain;Treatment limited secondary to agitation Patient left: in bed;with call bell/phone within reach;with bed alarm set Nurse Communication: Mobility status (need to check for incontinence) PT Visit Diagnosis: Unsteadiness on feet (R26.81);Other abnormalities of gait and mobility (R26.89);Difficulty in walking, not elsewhere classified (R26.2);Pain Pain - part of body:  (bottom)     Time: 5176-1607 PT Time Calculation (min) (ACUTE ONLY): 21 min  Charges:  $Gait Training: 8-22 mins                     Taishawn Smaldone B. Migdalia Dk PT, DPT Acute Rehabilitation Services Pager [phone] Office [phone]    [place] 05/06/2020, 1:58 PM

## 2020-05-06 NOTE — TOC Progression Note (Signed)
Transition of Care [facility]) - Progression Note    Patient Details  Name: Shannon Terry MRN: [id number] Date of Birth: March 27, 1940  Transition [facility]) CM/SW Contact  Zenon Mayo, RN Phone Number: 05/06/2020, 3:23 PM  Clinical Narrative:    NCM spoke with patient, she states she just wants to go home and take care of her husband.  Her husband picked up the phone and he states he would like HHPT, HHOT for patient. He does not have a preference of the agency.  She states she does not want the rollator.          Expected Discharge Plan and Services                                                 Social Determinants of Health (SDOH) Interventions    Readmission Risk Interventions No flowsheet data found.

## 2020-05-06 NOTE — Progress Notes (Signed)
MD paged pt daughter at bedside requesting an update.

## 2020-05-06 NOTE — Progress Notes (Addendum)
PROGRESS NOTE    Annelyse Rey  PPJ:[id number] DOB: 05-26-1940 DOA: 05/02/2020 PCP: Patient, No Pcp Per   Chief Complaint  Patient presents with  . Rectal Pain  Brief Narrative: 80 y.o. female with medical history significant forHx of Rectal GIST dx 2014 previously followed by oncology, HTN who presents with rectal bleeding and increased rectal pain. As per reports about 2 days PTA she noticed 2 days of bright red blood per rectum with large clots. This has since resolved but she presents given increased rectal pain.Evidently, patient was diagnosed with renal GIST back in 2014 and initially was following oncology outpatient and was treated on imatinib for some time with poor follow-up and eventually has not seen oncology since 2016. She has had numerous hospitalizations in the past for acute blood loss anemia due to her rectal mass and refused further work-up.  Subjective: Seen and examined this morning. BP stable and afebrile overnight hb at 9.3 gm. Creat up 1.3, hco3 20. Wbc downtrending 15k.   Assessment & Plan: Acute blood loss anemia 2/2 rectal bleeding from rectal GIST Malignant Rectal GIST Severe symptomatic anemia 2.2gm on admission:/P 4 UNITS prbc.  Recent Labs  Lab 05/04/20 0053 05/04/20 0754 05/04/20 1940 05/05/20 1142 05/06/20 0139  HGB 9.2* 8.8* 8.7* 8.8* 9.3*  HCT 27.8* 27.7* 27.4* 27.2* 28.8*   Hypertension: Blood pressure is stable.  Monitor  CKD Stage IIIa:Creatinine overall stable at 1.1-1.3 range. Recent Labs  Lab 05/02/20 2021 05/03/20 0707 05/04/20 0754 05/05/20 0102 05/06/20 0139  BUN 19 14 9 10 9   CREATININE 1.42* 1.30* 1.11* 1.11* 1.33*   Leucocytosis: significantly hihg on admission. ?cause-?from rectal gist. Downtrending. Does run high was in 19k > 8k in sept 2020, ua pending, was placed on rocephin for ?uti, 2/2 dysurea. Recent Labs  Lab 05/03/20 2017 05/04/20 0053 05/04/20 0754 05/05/20 1142 05/06/20 0139  WBC 19.9* 20.8* 21.1*  17.6* 15.2*   Hypokalemia-resolved  Fluid overload likely from prbc. Echo 3/2- lvef 55%, G1DD.S/p lasix x1. Appears euvolemic  Failure to thrive deconditioning PT OT evaluation today Goals of care DNR.  Patient again refused to discuss with palliative care or have another discussion regarding goals of care hospice eval or even setting of PCP.  Diet Order            DIET SOFT Room service appropriate? Yes; Fluid consistency: Thin  Diet effective now                Patient's Body mass index is 23.8 kg/m. DVT prophylaxis: SCDs Start: 05/03/20 0014 Code Status:   Code Status: DNR  Family Communication: plan of care discussed with patient at bedside.  Status is: Inpatient Remains inpatient appropriate because:Inpatient level of care appropriate due to severity of illness  Dispo: The patient is from: Home              Anticipated d/c is to: TBD              Patient currently is not medically stable to d/c.  PT eval today, anticipate discharge tomorrow if hemoglobin is stable.   Difficult to place patient No  Unresulted Labs (From admission, onward)          Start     Ordered   05/06/20 0500  VITAMIN D 25 Hydroxy (Vit-D Deficiency, Fractures)  Tomorrow morning,   R       Question:  Specimen collection method  Answer:  Lab=Lab collect   05/05/20 1246   05/04/20 1035  Urine Culture  Once,   R        05/04/20 1035   05/04/20 0724  Urinalysis, Routine w reflex microscopic  Once,   R        05/04/20 0723   05/03/20 0557  MRSA PCR Screening  Once,   R        05/03/20 0556   05/02/20 1945  CBC with Differential/Platelet  Once,   STAT        05/02/20 1946          Medications reviewed:  Scheduled Meds: . sodium chloride   Intravenous Once  . sodium chloride   Intravenous Once  . carvedilol  3.125 mg Oral BID WC  . folic acid  1 mg Oral Daily  . hydrocortisone cream   Topical BID  . mirtazapine  7.5 mg Oral QHS  . vitamin B-12  100 mcg Oral Daily   Continuous  Infusions: . cefTRIAXone (ROCEPHIN)  IV 1 g (05/05/20 1118)    Consultants:see note  Procedures:see note  Antimicrobials: Anti-infectives (From admission, onward)   Start     Dose/Rate Route Frequency Ordered Stop   05/04/20 1115  cefTRIAXone (ROCEPHIN) 1 g in sodium chloride 0.9 % 100 mL IVPB        1 g 200 mL/hr over 30 Minutes Intravenous Every 24 hours 05/04/20 1028       Culture/Microbiology    Component Value Date/Time   SDES BLOOD LEFT WRIST 11/10/2018 1040   SPECREQUEST  11/10/2018 1040    BOTTLES DRAWN AEROBIC ONLY Blood Culture results may not be optimal due to an inadequate volume of blood received in culture bottles   CULT  11/10/2018 1040    NO GROWTH 5 DAYS Performed at [facility] [address], [place], [place] [address]    REPTSTATUS 11/15/2018 FINAL 11/10/2018 1040    Other culture-see note  Objective: Vitals: Today's Vitals   05/06/20 0000 05/06/20 0300 05/06/20 0700 05/06/20 0850  BP: 131/70 137/82 135/72 129/74  Pulse: 88 83 79 83  Resp: 16 17 11    Temp: 98 F (36.7 C) 97.6 F (36.4 C) 98.2 F (36.8 C)   TempSrc: Oral Oral Oral   SpO2: 96% 97% 100%   Weight:      Height:      PainSc: 0-No pain 0-No pain     No intake or output data in the 24 hours ending 05/06/20 0925 Filed Weights   05/02/20 2047 05/03/20 0331  Weight: 66 kg 62.9 kg   Weight change:   Intake/Output from previous day: No intake/output data recorded. Intake/Output this shift: No intake/output data recorded. Filed Weights   05/02/20 2047 05/03/20 0331  Weight: 66 kg 62.9 kg    Examination: General exam: AAOX3, not interested to interact much thin and frail appearing,NAD, weak appearing. HEENT:Oral mucosa moist, Ear/Nose WNL grossly,dentition normal. Respiratory system: bilaterally diminished,no use of accessory muscle, non tender. Cardiovascular system: S1 & S2 +, regular, No JVD. Gastrointestinal system: Abdomen soft, NT,ND, BS+. Nervous System:Alert,  awake, moving extremities and grossly nonfocal Extremities: No edema, distal peripheral pulses palpable.  Skin: No rashes,no icterus. MSK: Normal muscle bulk,tone, power  Data Reviewed: I have personally reviewed following labs and imaging studies CBC: Recent Labs  Lab 05/02/20 2216 05/03/20 0707 05/03/20 2017 05/04/20 0053 05/04/20 0754 05/04/20 1940 05/05/20 1142 05/06/20 0139  WBC 14.6*   < > 19.9* 20.8* 21.1*  --  17.6* 15.2*  NEUTROABS 11.7*  --   --   --   --   --   --   --  HGB 2.2*   < > 8.9* 9.2* 8.8* 8.7* 8.8* 9.3*  HCT 9.3*   < > 26.5* 27.8* 27.7* 27.4* 27.2* 28.8*  MCV 50.8*   < > 69.6* 69.7* 70.7*  --  71.4* 71.3*  PLT 522*   < > 411* 432* 402*  --  347 337   < > = values in this interval not displayed.   Basic Metabolic Panel: Recent Labs  Lab 05/02/20 2021 05/03/20 0707 05/04/20 0754 05/05/20 0102 05/06/20 0139  NA 141 144 144 142 142  K 3.7 3.4* 3.6 3.3* 3.7  CL 114* 116* 118* 116* 114*  CO2 17* 18* 18* 16* 20*  GLUCOSE 97 102* 101* 75 105*  BUN 19 14 9 10 9   CREATININE 1.42* 1.30* 1.11* 1.11* 1.33*  CALCIUM 7.7* 7.8* 8.0* 8.1* 8.1*   GFR: Estimated Creatinine Clearance: 30.6 mL/min (A) (by C-G formula based on SCr of 1.33 mg/dL (H)). Liver Function Tests: Recent Labs  Lab 05/02/20 2021  AST 19  ALT 15  ALKPHOS 76  BILITOT 0.7  PROT 5.2*  ALBUMIN 2.5*   No results for input(s): LIPASE, AMYLASE in the last 168 hours. No results for input(s): AMMONIA in the last 168 hours. Coagulation Profile: Recent Labs  Lab 05/02/20 2021  INR 1.4*   Cardiac Enzymes: No results for input(s): CKTOTAL, CKMB, CKMBINDEX, TROPONINI in the last 168 hours. BNP (last 3 results) No results for input(s): PROBNP in the last 8760 hours. HbA1C: No results for input(s): HGBA1C in the last 72 hours. CBG: No results for input(s): GLUCAP in the last 168 hours. Lipid Profile: No results for input(s): CHOL, HDL, LDLCALC, TRIG, CHOLHDL, LDLDIRECT in the last 72  hours. Thyroid Function Tests: No results for input(s): TSH, T4TOTAL, FREET4, T3FREE, THYROIDAB in the last 72 hours. Anemia Panel: Recent Labs    05/06/20 0139  FERRITIN 18  TIBC 242*  IRON 14*   Sepsis Labs: No results for input(s): PROCALCITON, LATICACIDVEN in the last 168 hours.  Recent Results (from the past 240 hour(s))  SARS CORONAVIRUS 2 (TAT 6-24 HRS) Nasopharyngeal Nasopharyngeal Swab     Status: None   Collection Time: 05/02/20 11:51 PM   Specimen: Nasopharyngeal Swab  Result Value Ref Range Status   SARS Coronavirus 2 NEGATIVE NEGATIVE Final    Comment: (NOTE) SARS-CoV-2 target nucleic acids are NOT DETECTED.  The SARS-CoV-2 RNA is generally detectable in upper and lower respiratory specimens during the acute phase of infection. Negative results do not preclude SARS-CoV-2 infection, do not rule out co-infections with other pathogens, and should not be used as the sole basis for treatment or other patient management decisions. Negative results must be combined with clinical observations, patient history, and epidemiological information. The expected result is Negative.  Fact Sheet for Patients: SugarRoll.be  Fact Sheet for Healthcare Providers: https://www.woods-mathews.com/  This test is not yet approved or cleared by the [place] FDA and  has been authorized for detection and/or diagnosis of SARS-CoV-2 by FDA under an Emergency Use Authorization (EUA). This EUA will remain  in effect (meaning this test can be used) for the duration of the COVID-19 declaration under Se ction 564(b)(1) of the Act, 21 U.S.C. section 360bbb-3(b)(1), unless the authorization is terminated or revoked sooner.  Performed at [facility], [place] [address] [place], [place] [address]      Radiology Studies: DG CHEST PORT 1 VIEW  Result Date: 05/04/2020 CLINICAL DATA:  Leukocytosis. EXAM: PORTABLE CHEST 1 VIEW COMPARISON:   One-view  chest x-ray 11/11/18 FINDINGS: The heart is mildly enlarged. Lung volumes are low. Moderate pulmonary vascular congestion is present without frank edema. Mild dependent atelectasis is present. No other significant airspace disease is present. No definite effusions are present. Axial skeleton is unremarkable. IMPRESSION: 1. Mild cardiomegaly and moderate pulmonary vascular congestion without frank edema. 2. Low lung volumes. Electronically Signed   By: San Morelle M.D.   On: 05/04/2020 12:25   ECHOCARDIOGRAM COMPLETE  Result Date: 05/05/2020    ECHOCARDIOGRAM REPORT   Patient Name:   LINSY EHRESMAN Date of Exam: 05/05/2020 Medical Rec #:  [id number]  Height:       64.0 in Accession #:    [id number] Weight:       138.7 lb Date of Birth:  10-02-1940  BSA:          1.674 m Patient Age:    36 years   BP:           153/109 mmHg Patient Gender: F          HR:           62 bpm. Exam Location:  Inpatient Procedure: 2D Echo, Cardiac Doppler and Color Doppler Indications:    R94.31 Abnormal EKG  History:        Patient has prior history of Echocardiogram examinations, most                 recent 04/09/2015. Cancer. GERD. Anemia.  Sonographer:    Jonelle Sidle Dance Referring Phys: [place]  1. Left ventricular ejection fraction, by estimation, is 55%. The left ventricle has normal function. The left ventricle has no regional wall motion abnormalities but there is septal-lateral dyssynchrony consistent with LBBB. There is mild left ventricular hypertrophy. Left ventricular diastolic parameters are consistent with Grade I diastolic dysfunction (impaired relaxation).  2. Right ventricular systolic function is normal. The right ventricular size is normal. There is mildly elevated pulmonary artery systolic pressure. The estimated right ventricular systolic pressure is 70.2 mmHg.  3. Left atrial size was moderately dilated.  4. The mitral valve is normal in structure. Trivial mitral valve regurgitation. No  evidence of mitral stenosis.  5. The aortic valve is tricuspid. Aortic valve regurgitation is not visualized. No aortic stenosis is present.  6. The inferior vena cava is normal in size with greater than 50% respiratory variability, suggesting right atrial pressure of 3 mmHg. FINDINGS  Left Ventricle: Left ventricular ejection fraction, by estimation, is 55%. The left ventricle has normal function. The left ventricle has no regional wall motion abnormalities. The left ventricular internal cavity size was normal in size. There is mild left ventricular hypertrophy. Left ventricular diastolic parameters are consistent with Grade I diastolic dysfunction (impaired relaxation). Right Ventricle: The right ventricular size is normal. No increase in right ventricular wall thickness. Right ventricular systolic function is normal. There is mildly elevated pulmonary artery systolic pressure. The tricuspid regurgitant velocity is 2.96  m/s, and with an assumed right atrial pressure of 3 mmHg, the estimated right ventricular systolic pressure is 63.7 mmHg. Left Atrium: Left atrial size was moderately dilated. Right Atrium: Right atrial size was normal in size. Pericardium: There is no evidence of pericardial effusion. Mitral Valve: The mitral valve is normal in structure. Trivial mitral valve regurgitation. No evidence of mitral valve stenosis. Tricuspid Valve: The tricuspid valve is normal in structure. Tricuspid valve regurgitation is trivial. Aortic Valve: The aortic valve is tricuspid. Aortic valve regurgitation is not visualized. No aortic  stenosis is present. Pulmonic Valve: The pulmonic valve was normal in structure. Pulmonic valve regurgitation is trivial. Aorta: The aortic root is normal in size and structure. Venous: The inferior vena cava is normal in size with greater than 50% respiratory variability, suggesting right atrial pressure of 3 mmHg. IAS/Shunts: No atrial level shunt detected by color flow Doppler.  LEFT  VENTRICLE PLAX 2D LVIDd:         4.80 cm LVIDs:         3.60 cm LV PW:         0.90 cm LV IVS:        1.30 cm LVOT diam:     2.00 cm LV SV:         90 LV SV Index:   54 LVOT Area:     3.14 cm  RIGHT VENTRICLE          IVC RV Basal diam:  2.80 cm  IVC diam: 1.40 cm TAPSE (M-mode): 2.6 cm LEFT ATRIUM              Index       RIGHT ATRIUM           Index LA diam:        4.10 cm  2.45 cm/m  RA Area:     13.90 cm LA Vol (A2C):   117.0 ml 69.88 ml/m RA Volume:   26.60 ml  15.89 ml/m LA Vol (A4C):   65.5 ml  39.12 ml/m LA Biplane Vol: 90.7 ml  54.17 ml/m  AORTIC VALVE LVOT Vmax:   149.00 cm/s LVOT Vmean:  86.100 cm/s LVOT VTI:    0.286 m  AORTA Ao Root diam: 3.10 cm Ao Asc diam:  3.60 cm MITRAL VALVE               TRICUSPID VALVE MV Area (PHT): 2.62 cm    TR Peak grad:   35.0 mmHg MV Decel Time: 289 msec    TR Vmax:        296.00 cm/s MV E velocity: 75.80 cm/s MV A velocity: 96.70 cm/s  SHUNTS MV E/A ratio:  0.78        Systemic VTI:  0.29 m                            Systemic Diam: 2.00 cm Loralie Champagne MD Electronically signed by Loralie Champagne MD Signature Date/Time: 05/05/2020/4:44:53 PM    Final      LOS: 3 days   Antonieta Pert, MD [facility]  05/06/2020, 9:25 AM

## 2020-05-06 NOTE — Care Management Important Message (Signed)
Important Message  Patient Details  Name: Shannon Terry MRN: [id number] Date of Birth: 28-Dec-1940   Medicare Important Message Given:  Yes     Ryo Klang [address]ine Circle 05/06/2020, 3:25 PM

## 2020-05-06 NOTE — TOC Transition Note (Signed)
Transition of Care [facility]) - CM/SW Discharge Note   Patient Details  Name: Shannon Terry MRN: [id number] Date of Birth: 21-Jun-1940  Transition of Care [facility]) CM/SW Contact:  Zenon Mayo, RN Phone Number: 05/06/2020, 3:27 PM   Clinical Narrative:    NCM spoke with patient, she states she just wants to go home and take care of her husband.  Her husband picked up the phone and he states he would like HHPT, HHOT for patient. He does not have a preference of the agency.  She states she does not want the rollator.  NCM made referral to [name] with [facility]. Soc will begin 24 to 48 hrs post dc.   Final next level of care: [place] Barriers to Discharge: Continued Medical Work up   Patient Goals and CMS Choice Patient states their goals for this hospitalization and ongoing recovery are:: go home CMS Medicare.gov Compare Post Acute Care list provided to:: Patient Represenative (must comment) Choice offered to / list presented to : Spouse  Discharge Placement                       Discharge Plan and Services                  DME Agency: NA       HH Arranged: PT,OT [place] Agency: [place] Date [facility] Agency Contacted: 05/06/20 Time [place]: 1527 Representative spoke with at [place]: [place] ([place]) Interventions     Readmission Risk Interventions No flowsheet data found.

## 2020-05-07 LAB — BASIC METABOLIC PANEL
Anion gap: 8 (ref 5–15)
Anion gap: 6 (ref 5–15)
BUN: 19 mg/dL (ref 8–23)
BUN: 15 mg/dL (ref 8–23)
CO2: 19 mmol/L — ABNORMAL LOW (ref 22–32)
CO2: 19 mmol/L — ABNORMAL LOW (ref 22–32)
Calcium: 7.5 mg/dL — ABNORMAL LOW (ref 8.9–10.3)
Calcium: 7.5 mg/dL — ABNORMAL LOW (ref 8.9–10.3)
Chloride: 111 mmol/L (ref 98–111)
Chloride: 115 mmol/L — ABNORMAL HIGH (ref 98–111)
Creatinine, Ser: 1.54 mg/dL — ABNORMAL HIGH (ref 0.44–1.00)
Creatinine, Ser: 1.55 mg/dL — ABNORMAL HIGH (ref 0.44–1.00)
GFR, Estimated: 35 mL/min — ABNORMAL LOW (ref >60)
GFR, Estimated: 34 mL/min — ABNORMAL LOW (ref >60)
Glucose, Bld: 82 mg/dL (ref 70–99)
Glucose, Bld: 93 mg/dL (ref 70–99)
Potassium: 3.5 mmol/L (ref 3.5–5.1)
Potassium: 3.6 mmol/L (ref 3.5–5.1)
Sodium: 138 mmol/L (ref 135–145)
Sodium: 140 mmol/L (ref 135–145)

## 2020-05-07 LAB — CBC
HCT: 26.8 % — ABNORMAL LOW (ref 36.0–46.0)
Hemoglobin: 8.5 g/dL — ABNORMAL LOW (ref 12.0–15.0)
MCH: 23 pg — ABNORMAL LOW (ref 26.0–34.0)
MCHC: 31.7 g/dL (ref 30.0–36.0)
MCV: 72.4 fL — ABNORMAL LOW (ref 80.0–100.0)
Platelets: 240 10*3/uL (ref 150–400)
RBC: 3.7 MIL/uL — ABNORMAL LOW (ref 3.87–5.11)
WBC: 12.7 10*3/uL — ABNORMAL HIGH (ref 4.0–10.5)
nRBC: 0.2 % (ref 0.0–0.2)

## 2020-05-07 MED ORDER — VITAMIN D (ERGOCALCIFEROL) 1.25 MG (50000 UNIT) PO CAPS
50000.0000 [IU] | ORAL_CAPSULE | ORAL | Status: DC
  Administered 2020-05-07: 50000 [IU] via ORAL
  Filled 2020-05-07: qty 1

## 2020-05-07 MED ORDER — CYANOCOBALAMIN 100 MCG PO TABS: 100.0000 ug | ORAL_TABLET | Freq: Every day | ORAL | 0 refills | Status: AC

## 2020-05-07 MED ORDER — TRAMADOL HCL 50 MG PO TABS: 50.0000 mg | ORAL_TABLET | Freq: Four times a day (QID) | ORAL | 0 refills | Status: DC | PRN

## 2020-05-07 MED ORDER — FOLIC ACID 1 MG PO TABS: 1.0000 mg | ORAL_TABLET | Freq: Every day | ORAL | 0 refills | Status: AC

## 2020-05-07 MED ORDER — SODIUM CHLORIDE 0.9 % IV BOLUS
500.0000 mL | Freq: Once | INTRAVENOUS | Status: AC
  Administered 2020-05-07: 500 mL via INTRAVENOUS

## 2020-05-07 MED ORDER — CEPHALEXIN 500 MG PO CAPS: 500.0000 mg | ORAL_CAPSULE | Freq: Three times a day (TID) | ORAL | 0 refills | Status: AC

## 2020-05-07 MED ORDER — CARVEDILOL 3.125 MG PO TABS: 3.1250 mg | ORAL_TABLET | Freq: Two times a day (BID) | ORAL | 0 refills | Status: DC

## 2020-05-07 MED ORDER — VITAMIN D (ERGOCALCIFEROL) 1.25 MG (50000 UNIT) PO CAPS: 50000.0000 [IU] | ORAL_CAPSULE | ORAL | 0 refills | Status: AC

## 2020-05-07 NOTE — Progress Notes (Addendum)
Occupational Therapy Treatment Patient Details Name: Shannon Terry MRN: [id number] DOB: 1940-08-05 Today's Date: 05/07/2020    History of present illness 80 year old with a PMH including Acute renal failure, Anasarca, Anemia, iron deficiency, Gastritis without bleeding, Hematochezia, Hemorrhoids, Malignant GIST(05/30/2012), Obesity, and Rectal cancer (04/03/12) who presented with rectal bleeding and increased rectal pain.   OT comments  Pt making steady progress towards OT goals this session. Pt currently requires min guard assist for functional sit<>stands during ADLS and total A- MOD A for LB ADLs. Pt continues to present with decreased activity tolerance, decreased ability to care for self and impaired balance. Education on energy conservation strategies (ECS) provided during session with pt verbalizing understanding and dtr endorsing that pt already uses ECS strategies at home.  Pt would continue to benefit from skilled occupational therapy while admitted and after d/c to address the below listed limitations in order to improve overall functional mobility and facilitate independence with BADL participation. DC plan remains appropriate, will follow acutely per POC.     Follow Up Recommendations  Home health OT;Supervision/Assistance - 24 hour;Other (comment) (Pt might refuse HHOT)    Equipment Recommendations  Other (comment) (Pt has appropriate DME)    Recommendations for Other Services      Precautions / Restrictions Precautions Precautions: Fall Restrictions Weight Bearing Restrictions: No       Mobility Bed Mobility Overal bed mobility: Needs Assistance Bed Mobility: Supine to Sit     Supine to sit: Supervision     General bed mobility comments: supervision for safety from flat HOB, no physcial assist needed, but increased time noted    Transfers Overall transfer level: Needs assistance Equipment used: Rolling walker (2 wheeled) Transfers: Sit to/from Stand Sit to Stand:  Min guard         General transfer comment: pt completed x4 sit<>stands from EOB for ADLs, using RW as steadying assist. min guard for safety and for clothing mgmt during ADLs    Balance Overall balance assessment: Needs assistance Sitting-balance support: Feet supported;No upper extremity supported Sitting balance-Leahy Scale: Good Sitting balance - Comments: sitting EOB for ADLs   Standing balance support: Bilateral upper extremity supported;During functional activity Standing balance-Leahy Scale: Poor Standing balance comment: reliant on BUE support while COTA and dtr assisted with LB ADLS                           ADL either performed or assessed with clinical judgement   ADL Overall ADL's : Needs assistance/impaired             Lower Body Bathing: Total assistance;Sit to/from stand Lower Body Bathing Details (indicate cue type and reason): simulated via pericare d/t incontinent Upper Body Dressing : Supervision/safety;Set up;Sitting Upper Body Dressing Details (indicate cue type and reason): to son [place] shirt Lower Body Dressing: Moderate assistance;Sit to/from stand Lower Body Dressing Details (indicate cue type and reason): to don pants and brief from EOB>sit<>stand     Toileting- Clothing Manipulation and Hygiene: Total assistance;Sit to/from stand Toileting - Clothing Manipulation Details (indicate cue type and reason): for cleaning afterBM     Functional mobility during ADLs: Min guard (sit<>stand only)       Vision       Perception     Praxis      Cognition Arousal/Alertness: Awake/alert Behavior During Therapy: Flat affect Overall Cognitive Status: Impaired/Different from baseline Area of Impairment: Safety/judgement;Problem solving;Awareness  Safety/Judgement: Decreased awareness of safety Awareness: Intellectual Problem Solving: Slow processing;Decreased initiation;Requires verbal cues;Requires tactile  cues;Difficulty sequencing General Comments: allows daughter to answer for her, noted to decline getting on [facility] even though pt reports "I need to have BM" pt prefers to have BM in brief and then requried encouragement to clean pt up as pt states, "I'd rather just sit in it until I get home"        Exercises     Shoulder Instructions       General Comments daughter present during session, reporting she takes off work to help her mother as often as she needs    Pertinent Vitals/ Pain       Pain Assessment: No/denies pain  Home Living                                          Prior Functioning/Environment              Frequency  Min 2X/week        Progress Toward Goals  OT Goals(current goals can now be found in the care plan section)  Progress towards OT goals: Progressing toward goals  Acute Rehab OT Goals Patient Stated Goal: go home OT Goal Formulation: With patient Time For Goal Achievement: 05/18/20 Potential to Achieve Goals: [place] Discharge plan remains appropriate;Frequency remains appropriate    Co-evaluation                 AM-PAC OT "6 Clicks" Daily Activity     Outcome Measure   Help from another person eating meals?: None Help from another person taking care of personal grooming?: A Little Help from another person toileting, which includes using toliet, bedpan, or urinal?: A Lot Help from another person bathing (including washing, rinsing, drying)?: A Lot Help from another person to put on and taking off regular upper body clothing?: A Little Help from another person to put on and taking off regular lower body clothing?: A Lot 6 Click Score: 16    End of Session Equipment Utilized During Treatment: Rolling walker  OT Visit Diagnosis: Unsteadiness on feet (R26.81);Muscle weakness (generalized) (M62.81);Other symptoms and signs involving cognitive function   Activity Tolerance Patient tolerated treatment well    Patient Left in bed;with call bell/phone within reach;with family/visitor present   Nurse Communication Mobility status;Other (comment) (dressed)        Time: 5102-5852 OT Time Calculation (min): 18 min  Charges: OT General Charges $OT Visit: 1 Visit OT Treatments $Self Care/Home Management : 8-22 mins  Harley Alto., COTA/L Acute Rehabilitation Services [place] 05/07/2020, 12:55 PM

## 2020-05-07 NOTE — Discharge Summary (Signed)
Physician Discharge Summary  Shannon Terry CNO:[id number] DOB: 01-19-1941 DOA: 05/02/2020  PCP: Patient, No Pcp Per  Admit date: 05/02/2020 Discharge date: 05/07/2020  Admitted From: [facility] Disposition:  HOME  Recommendations for Outpatient Follow-up:  Follow up with PCP in 1-2 weeks Please obtain BMP/CBC in one week Please follow up on the following pending results:  Home Health:YES  Equipment/Devices: NONE  Discharge Condition: Stable Code Status:   Code Status: DNR Diet recommendation:  Diet Order             Diet - low sodium heart healthy           DIET SOFT Room service appropriate? Yes; Fluid consistency: Thin  Diet effective now                    Brief/Interim Summary: 80 y.o. female with medical history significant for Hx of Rectal GIST dx 2014 previously followed by oncology, HTN who presents with rectal bleeding and increased rectal pain.  As per reports about 2 days PTA she noticed 2 days of bright red blood per rectum with large clots.  This has since resolved but she presents given increased rectal pain. Evidently, patient was diagnosed with renal GIST back in 2014 and initially was following oncology outpatient and was treated on imatinib for some time with poor follow-up and eventually has not seen oncology since 2016. She has had numerous hospitalizations in the past for acute blood loss anemia due to her rectal mass and refused further work-up. Patient was admitted status post 4 units PRBC hemoglobin is improved to 8 g range. She is ambulatory alert awake oriented x3. Patient has been adamant on leaving home today-did offer overnight to stay in hydration given her elevated creatinine, also discussed with patient's daughter but at this time patient wants to go home. Provided with outpatient community clinic number and instruction to check CBC BMP in 5 to 7 days.  Discharge Diagnoses:  Acute blood loss anemia 2/2 rectal bleeding from rectal GIST Malignant Rectal  GIST Severe symptomatic anemia 2.2gm on admission: S/P 4 UNITS prbc. Hemoglobin has stabilized.  Repeat CBC in 5 to 7 days.  Again discussed that she needs to have good outpatient follow-up to monitor CBC to minimize similar admission which could be detrimental could be fatal-discussed this with the patient and also her daughter.  Patient refuses to have any follow-up with any doctor and refusing to discuss about palliative care about hospice.  Recent Labs  Lab 05/04/20 0754 05/04/20 1940 05/05/20 1142 05/06/20 0139 05/07/20 0118  HGB 8.8* 8.7* 8.8* 9.3* 8.5*  HCT 27.7* 27.4* 27.2* 28.8* 26.8*   Hypertension: Blood pressure is stable.  Continue current low-dose.  CKD Stage IIIa:Creatinine overall stable at 1.1-1.3 range.  Creatinine slightly up at 1.5 stable again on repeat level at 1.5 despite IV fluid bolus.  Offered to keep another day to monitor renal functions and for hydration but patient is requesting for discharge understanding she may have worsening renal failure-advised to check BMP in 5 to 7 days. Recent Labs  Lab 05/04/20 0754 05/05/20 0102 05/06/20 0139 05/07/20 0118 05/07/20 1019  BUN 9 10 9 19 15   CREATININE 1.11* 1.11* 1.33* 1.54* 1.55*   Leucocytosis: significantly high on admission. ?cause-?from rectal gist vs UTI.Does run high was in 19k > 8k in sept 2020, ua pending-as placed on rocephin for ?uti, 2/2 dysurea.  Complete the Keflex 2 more days Recent Labs  Lab 05/04/20 0053 05/04/20 0754 05/05/20 1142 05/06/20 0139  05/07/20 0118  WBC 20.8* 21.1* 17.6* 15.2* 12.7*   Hypokalemia-resolved  Fluid overload likely from prbc.  Compensated stable echo Echo 3/2- lvef 55%, G1DD.S/p lasix x1.  Failure to thrive deconditioning PT OT evaluation today Goals of care DNR.  Patient again refused to discuss with palliative care or have another discussion regarding goals of care hospice eval or even setting of PCP.  Consults: none  Subjective: Alert awake oriented x3.   Requesting to be discharged this morning  Discharge Exam: Vitals:   05/07/20 0738 05/07/20 1117  BP: 121/74 (!) 112/59  Pulse: 88 85  Resp: 14 12  Temp: 97.9 F (36.6 C) 98.5 F (36.9 C)  SpO2: 97% 98%   General: Pt is alert, awake, not in acute distress Cardiovascular: RRR, S1/S2 +, no rubs, no gallops Respiratory: CTA bilaterally, no wheezing, no rhonchi Abdominal: Soft, NT, ND, bowel sounds + Extremities: no edema, no cyanosis  Discharge Instructions  Discharge Instructions     Diet - low sodium heart healthy   Complete by: As directed    Discharge instructions   Complete by: As directed    Please call call MD or return to ER for similar or worsening recurring problem that brought you to hospital or if any fever,nausea/vomiting,abdominal pain, uncontrolled pain, chest pain,  shortness of breath or any other alarming symptoms.  Please check your CBC and BMP in the next 5 to 7 days to monitor your blood count and also your kidney function status.  You will need to establish outpatient primary care-I have provided community clinic number if you do not have PCP soon.  Please follow-up your doctor as instructed in a week time and call the office for appointment.  Please avoid alcohol, smoking, or any other illicit substance and maintain healthy habits including taking your regular medications as prescribed.  You were cared for by a hospitalist during your hospital stay. If you have any questions about your discharge medications or the care you received while you were in the hospital after you are discharged, you can call the unit and ask to speak with the hospitalist on call if the hospitalist that took care of you is not available.  Once you are discharged, your primary care physician will handle any further medical issues. Please note that NO REFILLS for any discharge medications will be authorized once you are discharged, as it is imperative that you return to your primary  care physician (or establish a relationship with a primary care physician if you do not have one) for your aftercare needs so that they can reassess your need for medications and monitor your lab values   Increase activity slowly   Complete by: As directed       Allergies as of 05/07/2020   No Known Allergies      Medication List     TAKE these medications    acetaminophen 500 MG tablet Commonly known as: TYLENOL Take 1,000 mg by mouth every 6 (six) hours as needed for mild pain.   carvedilol 3.125 MG tablet Commonly known as: COREG Take 1 tablet (3.125 mg total) by mouth 2 (two) times daily with a meal.   cephALEXin 500 MG capsule Commonly known as: KEFLEX Take 1 capsule (500 mg total) by mouth 3 (three) times daily for 2 days.   cyanocobalamin 100 MCG tablet Take 1 tablet (100 mcg total) by mouth daily. Start taking on: May 08, 3760   folic acid 1 MG tablet Commonly known as: [employer]  Take 1 tablet (1 mg total) by mouth daily. Start taking on: May 08, 2020   pantoprazole 40 MG tablet Commonly known as: Protonix Take 1 tablet (40 mg total) by mouth daily.   traMADol 50 MG tablet Commonly known as: Ultram Take 1 tablet (50 mg total) by mouth every 6 (six) hours as needed for up to 12 doses.   Vitamin D (Ergocalciferol) 1.25 MG (50000 UNIT) Caps capsule Commonly known as: DRISDOL Take 1 capsule (50,000 Units total) by mouth every 7 (seven) days for 5 doses. Start taking on: May 14, 2020        Follow-up Information     [facility] Follow up.   Why: HHPT,HHOT Contact information: [place] [address] [phone]                No Known Allergies  The results of significant diagnostics from this hospitalization (including imaging, microbiology, ancillary and laboratory) are listed below for reference.    Microbiology: Recent Results (from the past 240 hour(s))  SARS CORONAVIRUS 2 (TAT 6-24 HRS)  Nasopharyngeal Nasopharyngeal Swab     Status: None   Collection Time: 05/02/20 11:51 PM   Specimen: Nasopharyngeal Swab  Result Value Ref Range Status   SARS Coronavirus 2 NEGATIVE NEGATIVE Final    Comment: (NOTE) SARS-CoV-2 target nucleic acids are NOT DETECTED.  The SARS-CoV-2 RNA is generally detectable in upper and lower respiratory specimens during the acute phase of infection. Negative results do not preclude SARS-CoV-2 infection, do not rule out co-infections with other pathogens, and should not be used as the sole basis for treatment or other patient management decisions. Negative results must be combined with clinical observations, patient history, and epidemiological information. The expected result is Negative.  Fact Sheet for Patients: SugarRoll.be  Fact Sheet for Healthcare Providers: https://www.woods-mathews.com/  This test is not yet approved or cleared by the [place] FDA and  has been authorized for detection and/or diagnosis of SARS-CoV-2 by FDA under an Emergency Use Authorization (EUA). This EUA will remain  in effect (meaning this test can be used) for the duration of the COVID-19 declaration under Se ction 564(b)(1) of the Act, 21 U.S.C. section 360bbb-3(b)(1), unless the authorization is terminated or revoked sooner.  Performed at [facility], [place] [address] [place], [place] [address]     Procedures/Studies: [place] CHEST PORT 1 VIEW  Result Date: 05/04/2020 CLINICAL DATA:  Leukocytosis. EXAM: PORTABLE CHEST 1 VIEW COMPARISON:  One-view chest x-ray 11/11/18 FINDINGS: The heart is mildly enlarged. Lung volumes are low. Moderate pulmonary vascular congestion is present without frank edema. Mild dependent atelectasis is present. No other significant airspace disease is present. No definite effusions are present. Axial skeleton is unremarkable. IMPRESSION: 1. Mild cardiomegaly and moderate pulmonary vascular  congestion without frank edema. 2. Low lung volumes. Electronically Signed   By: San Morelle M.D.   On: 05/04/2020 12:25   ECHOCARDIOGRAM COMPLETE  Result Date: 05/05/2020    ECHOCARDIOGRAM REPORT   Patient Name:   Shannon Terry Date of Exam: 05/05/2020 Medical Rec #:  [id number]  Height:       64.0 in Accession #:    [id number] Weight:       138.7 lb Date of Birth:  [birth year]  BSA:          1.674 m Patient Age:    72 years   BP:           153/109 mmHg Patient Gender:  F          HR:           62 bpm. Exam Location:  Inpatient Procedure: 2D Echo, Cardiac Doppler and Color Doppler Indications:    R94.31 Abnormal EKG  History:        Patient has prior history of Echocardiogram examinations, most                 recent 04/09/2015. Cancer. GERD. Anemia.  Sonographer:    Jonelle Sidle Dance Referring Phys: [place]  1. Left ventricular ejection fraction, by estimation, is 55%. The left ventricle has normal function. The left ventricle has no regional wall motion abnormalities but there is septal-lateral dyssynchrony consistent with LBBB. There is mild left ventricular hypertrophy. Left ventricular diastolic parameters are consistent with Grade I diastolic dysfunction (impaired relaxation).  2. Right ventricular systolic function is normal. The right ventricular size is normal. There is mildly elevated pulmonary artery systolic pressure. The estimated right ventricular systolic pressure is 62.9 mmHg.  3. Left atrial size was moderately dilated.  4. The mitral valve is normal in structure. Trivial mitral valve regurgitation. No evidence of mitral stenosis.  5. The aortic valve is tricuspid. Aortic valve regurgitation is not visualized. No aortic stenosis is present.  6. The inferior vena cava is normal in size with greater than 50% respiratory variability, suggesting right atrial pressure of 3 mmHg. FINDINGS  Left Ventricle: Left ventricular ejection fraction, by estimation, is 55%. The left ventricle  has normal function. The left ventricle has no regional wall motion abnormalities. The left ventricular internal cavity size was normal in size. There is mild left ventricular hypertrophy. Left ventricular diastolic parameters are consistent with Grade I diastolic dysfunction (impaired relaxation). Right Ventricle: The right ventricular size is normal. No increase in right ventricular wall thickness. Right ventricular systolic function is normal. There is mildly elevated pulmonary artery systolic pressure. The tricuspid regurgitant velocity is 2.96  m/s, and with an assumed right atrial pressure of 3 mmHg, the estimated right ventricular systolic pressure is 52.8 mmHg. Left Atrium: Left atrial size was moderately dilated. Right Atrium: Right atrial size was normal in size. Pericardium: There is no evidence of pericardial effusion. Mitral Valve: The mitral valve is normal in structure. Trivial mitral valve regurgitation. No evidence of mitral valve stenosis. Tricuspid Valve: The tricuspid valve is normal in structure. Tricuspid valve regurgitation is trivial. Aortic Valve: The aortic valve is tricuspid. Aortic valve regurgitation is not visualized. No aortic stenosis is present. Pulmonic Valve: The pulmonic valve was normal in structure. Pulmonic valve regurgitation is trivial. Aorta: The aortic root is normal in size and structure. Venous: The inferior vena cava is normal in size with greater than 50% respiratory variability, suggesting right atrial pressure of 3 mmHg. IAS/Shunts: No atrial level shunt detected by color flow Doppler.  LEFT VENTRICLE PLAX 2D LVIDd:         4.80 cm LVIDs:         3.60 cm LV PW:         0.90 cm LV IVS:        1.30 cm LVOT diam:     2.00 cm LV SV:         90 LV SV Index:   54 LVOT Area:     3.14 cm  RIGHT VENTRICLE          IVC RV Basal diam:  2.80 cm  IVC diam: 1.40 cm TAPSE (M-mode): 2.6 cm  LEFT ATRIUM              Index       RIGHT ATRIUM           Index LA diam:        4.10 cm   2.45 cm/m  RA Area:     13.90 cm LA Vol (A2C):   117.0 ml 69.88 ml/m RA Volume:   26.60 ml  15.89 ml/m LA Vol (A4C):   65.5 ml  39.12 ml/m LA Biplane Vol: 90.7 ml  54.17 ml/m  AORTIC VALVE LVOT Vmax:   149.00 cm/s LVOT Vmean:  86.100 cm/s LVOT VTI:    0.286 m  AORTA Ao Root diam: 3.10 cm Ao Asc diam:  3.60 cm MITRAL VALVE               TRICUSPID VALVE MV Area (PHT): 2.62 cm    TR Peak grad:   35.0 mmHg MV Decel Time: 289 msec    TR Vmax:        296.00 cm/s MV E velocity: 75.80 cm/s MV A velocity: 96.70 cm/s  SHUNTS MV E/A ratio:  0.78        Systemic VTI:  0.29 m                            Systemic Diam: 2.00 cm Loralie Champagne MD Electronically signed by Loralie Champagne MD Signature Date/Time: 05/05/2020/4:44:53 PM    Final     Labs: BNP (last 3 results) No results for input(s): BNP in the last 8760 hours. Basic Metabolic Panel: Recent Labs  Lab 05/04/20 0754 05/05/20 0102 05/06/20 0139 05/07/20 0118 05/07/20 1019  NA 144 142 142 138 140  K 3.6 3.3* 3.7 3.5 3.6  CL 118* 116* 114* 111 115*  CO2 18* 16* 20* 19* 19*  GLUCOSE 101* 75 105* 82 93  BUN 9 10 9 19 15   CREATININE 1.11* 1.11* 1.33* 1.54* 1.55*  CALCIUM 8.0* 8.1* 8.1* 7.5* 7.5*   Liver Function Tests: Recent Labs  Lab 05/02/20 2021  AST 19  ALT 15  ALKPHOS 76  BILITOT 0.7  PROT 5.2*  ALBUMIN 2.5*   No results for input(s): LIPASE, AMYLASE in the last 168 hours. No results for input(s): AMMONIA in the last 168 hours. CBC: Recent Labs  Lab 05/02/20 2216 05/03/20 0707 05/04/20 0053 05/04/20 0754 05/04/20 1940 05/05/20 1142 05/06/20 0139 05/07/20 0118  WBC 14.6*   < > 20.8* 21.1*  --  17.6* 15.2* 12.7*  NEUTROABS 11.7*  --   --   --   --   --   --   --   HGB 2.2*   < > 9.2* 8.8* 8.7* 8.8* 9.3* 8.5*  HCT 9.3*   < > 27.8* 27.7* 27.4* 27.2* 28.8* 26.8*  MCV 50.8*   < > 69.7* 70.7*  --  71.4* 71.3* 72.4*  PLT 522*   < > 432* 402*  --  347 337 240   < > = values in this interval not displayed.   Cardiac  Enzymes: No results for input(s): CKTOTAL, CKMB, CKMBINDEX, TROPONINI in the last 168 hours. BNP: Invalid input(s): POCBNP CBG: No results for input(s): GLUCAP in the last 168 hours. D-Dimer No results for input(s): DDIMER in the last 72 hours. Hgb A1c No results for input(s): HGBA1C in the last 72 hours. Lipid Profile No results for input(s): CHOL, HDL, LDLCALC, TRIG, CHOLHDL, LDLDIRECT in the last  72 hours. Thyroid function studies No results for input(s): TSH, T4TOTAL, T3FREE, THYROIDAB in the last 72 hours.  Invalid input(s): FREET3 Anemia work up Recent Labs    05/06/20 0139  FERRITIN 18  TIBC 242*  IRON 14*   Urinalysis    Component Value Date/Time   COLORURINE YELLOW 11/10/2018 1250   APPEARANCEUR CLEAR 11/10/2018 1250   LABSPEC 1.013 11/10/2018 1250   PHURINE 5.0 11/10/2018 1250   GLUCOSEU NEGATIVE 11/10/2018 1250   HGBUR NEGATIVE 11/10/2018 1250   BILIRUBINUR NEGATIVE 11/10/2018 1250   KETONESUR NEGATIVE 11/10/2018 1250   PROTEINUR NEGATIVE 11/10/2018 1250   UROBILINOGEN 1.0 04/01/2012 0100   NITRITE NEGATIVE 11/10/2018 1250   LEUKOCYTESUR NEGATIVE 11/10/2018 1250   Sepsis Labs Invalid input(s): PROCALCITONIN,  WBC,  LACTICIDVEN Microbiology Recent Results (from the past 240 hour(s))  SARS CORONAVIRUS 2 (TAT 6-24 HRS) Nasopharyngeal Nasopharyngeal Swab     Status: None   Collection Time: 05/02/20 11:51 PM   Specimen: Nasopharyngeal Swab  Result Value Ref Range Status   SARS Coronavirus 2 NEGATIVE NEGATIVE Final    Comment: (NOTE) SARS-CoV-2 target nucleic acids are NOT DETECTED.  The SARS-CoV-2 RNA is generally detectable in upper and lower respiratory specimens during the acute phase of infection. Negative results do not preclude SARS-CoV-2 infection, do not rule out co-infections with other pathogens, and should not be used as the sole basis for treatment or other patient management decisions. Negative results must be combined with clinical  observations, patient history, and epidemiological information. The expected result is Negative.  Fact Sheet for Patients: SugarRoll.be  Fact Sheet for Healthcare Providers: https://www.woods-mathews.com/  This test is not yet approved or cleared by the [place] FDA and  has been authorized for detection and/or diagnosis of SARS-CoV-2 by FDA under an Emergency Use Authorization (EUA). This EUA will remain  in effect (meaning this test can be used) for the duration of the COVID-19 declaration under Se ction 564(b)(1) of the Act, 21 U.S.C. section 360bbb-3(b)(1), unless the authorization is terminated or revoked sooner.  Performed at [facility], [place] [address] [place], [place] [address]      Time coordinating discharge: 25 minutes  SIGNED: Antonieta Pert, MD  [facility] 05/07/2020, 11:38 AM  If 7PM-7AM, please contact night-coverage [email or web address]

## 2020-05-07 NOTE — Progress Notes (Incomplete)
Provided patient and daughter [name] with verbal discharge instructions. RN answered all questions. IV removed. VSS at discharge. Belongings sent with patient.

## 2020-05-07 NOTE — Plan of Care (Signed)

## 2020-09-06 ENCOUNTER — Other Ambulatory Visit: Payer: Self-pay

## 2020-09-06 ENCOUNTER — Encounter (HOSPITAL_COMMUNITY): Payer: Self-pay | Admitting: Emergency Medicine

## 2020-09-06 ENCOUNTER — Inpatient Hospital Stay (HOSPITAL_COMMUNITY)
Admission: EM | Admit: 2020-09-06 | Discharge: 2020-09-08 | DRG: 812 | Disposition: A | Discharge status: Home / Self Care | Payer: Medicare Other | Attending: Internal Medicine | Admitting: Internal Medicine

## 2020-09-06 DIAGNOSIS — E875 Hyperkalemia: Secondary | ICD-10-CM | POA: Diagnosis present

## 2020-09-06 DIAGNOSIS — Z20822 Contact with and (suspected) exposure to covid-19: Secondary | ICD-10-CM | POA: Diagnosis present

## 2020-09-06 DIAGNOSIS — Z85048 Personal history of other malignant neoplasm of rectum, rectosigmoid junction, and anus: Secondary | ICD-10-CM

## 2020-09-06 DIAGNOSIS — Z6841 Body Mass Index (BMI) 40.0 and over, adult: Secondary | ICD-10-CM | POA: Diagnosis not present

## 2020-09-06 DIAGNOSIS — Z800 Family history of malignant neoplasm of digestive organs: Secondary | ICD-10-CM | POA: Diagnosis not present

## 2020-09-06 DIAGNOSIS — D649 Anemia, unspecified: Secondary | ICD-10-CM | POA: Diagnosis present

## 2020-09-06 DIAGNOSIS — N179 Acute kidney failure, unspecified: Secondary | ICD-10-CM | POA: Diagnosis present

## 2020-09-06 DIAGNOSIS — Z66 Do not resuscitate: Secondary | ICD-10-CM | POA: Diagnosis present

## 2020-09-06 DIAGNOSIS — Z806 Family history of leukemia: Secondary | ICD-10-CM

## 2020-09-06 DIAGNOSIS — Z833 Family history of diabetes mellitus: Secondary | ICD-10-CM

## 2020-09-06 DIAGNOSIS — Z8673 Personal history of transient ischemic attack (TIA), and cerebral infarction without residual deficits: Secondary | ICD-10-CM | POA: Diagnosis not present

## 2020-09-06 DIAGNOSIS — I5032 Chronic diastolic (congestive) heart failure: Secondary | ICD-10-CM | POA: Diagnosis present

## 2020-09-06 DIAGNOSIS — K921 Melena: Secondary | ICD-10-CM | POA: Diagnosis present

## 2020-09-06 DIAGNOSIS — E669 Obesity, unspecified: Secondary | ICD-10-CM | POA: Diagnosis present

## 2020-09-06 DIAGNOSIS — I130 Hypertensive heart and chronic kidney disease with heart failure and stage 1 through stage 4 chronic kidney disease, or unspecified chronic kidney disease: Secondary | ICD-10-CM | POA: Diagnosis present

## 2020-09-06 DIAGNOSIS — N1832 Chronic kidney disease, stage 3b: Secondary | ICD-10-CM | POA: Diagnosis present

## 2020-09-06 DIAGNOSIS — K219 Gastro-esophageal reflux disease without esophagitis: Secondary | ICD-10-CM | POA: Diagnosis present

## 2020-09-06 DIAGNOSIS — Z823 Family history of stroke: Secondary | ICD-10-CM

## 2020-09-06 DIAGNOSIS — K922 Gastrointestinal hemorrhage, unspecified: Secondary | ICD-10-CM | POA: Diagnosis present

## 2020-09-06 DIAGNOSIS — Z85831 Personal history of malignant neoplasm of soft tissue: Secondary | ICD-10-CM | POA: Diagnosis not present

## 2020-09-06 DIAGNOSIS — Z8249 Family history of ischemic heart disease and other diseases of the circulatory system: Secondary | ICD-10-CM

## 2020-09-06 DIAGNOSIS — D62 Acute posthemorrhagic anemia: Secondary | ICD-10-CM | POA: Diagnosis present

## 2020-09-06 DIAGNOSIS — C49A0 Gastrointestinal stromal tumor, unspecified site: Secondary | ICD-10-CM | POA: Diagnosis present

## 2020-09-06 DIAGNOSIS — Z90710 Acquired absence of both cervix and uterus: Secondary | ICD-10-CM | POA: Diagnosis not present

## 2020-09-06 DIAGNOSIS — I10 Essential (primary) hypertension: Secondary | ICD-10-CM | POA: Diagnosis not present

## 2020-09-06 DIAGNOSIS — C49A5 Gastrointestinal stromal tumor of rectum: Secondary | ICD-10-CM

## 2020-09-06 DIAGNOSIS — E861 Hypovolemia: Secondary | ICD-10-CM | POA: Diagnosis present

## 2020-09-06 LAB — CBC WITH DIFFERENTIAL/PLATELET
Abs Immature Granulocytes: 0.07 10*3/uL (ref 0.00–0.07)
Abs Immature Granulocytes: 0.07 10*3/uL (ref 0.00–0.07)
Basophils Absolute: 0 10*3/uL (ref 0.0–0.1)
Basophils Absolute: 0 10*3/uL (ref 0.0–0.1)
Basophils Relative: 0 %
Basophils Relative: 0 %
Eosinophils Absolute: 0.1 10*3/uL (ref 0.0–0.5)
Eosinophils Absolute: 0.1 10*3/uL (ref 0.0–0.5)
Eosinophils Relative: 1 %
Eosinophils Relative: 1 %
HCT: 10.4 % — ABNORMAL LOW (ref 36.0–46.0)
HCT: 10.9 % — ABNORMAL LOW (ref 36.0–46.0)
Hemoglobin: 2.6 g/dL — CL (ref 12.0–15.0)
Hemoglobin: 2.7 g/dL — CL (ref 12.0–15.0)
Immature Granulocytes: 1 %
Immature Granulocytes: 1 %
Lymphocytes Relative: 11 %
Lymphocytes Relative: 9 %
Lymphs Abs: 1.3 10*3/uL (ref 0.7–4.0)
Lymphs Abs: 1.2 10*3/uL (ref 0.7–4.0)
MCH: 13.3 pg — ABNORMAL LOW (ref 26.0–34.0)
MCH: 13.4 pg — ABNORMAL LOW (ref 26.0–34.0)
MCHC: 25 g/dL — ABNORMAL LOW (ref 30.0–36.0)
MCHC: 24.8 g/dL — ABNORMAL LOW (ref 30.0–36.0)
MCV: 53.3 fL — ABNORMAL LOW (ref 80.0–100.0)
MCV: 54.2 fL — ABNORMAL LOW (ref 80.0–100.0)
Monocytes Absolute: 1.8 10*3/uL — ABNORMAL HIGH (ref 0.1–1.0)
Monocytes Absolute: 1.7 10*3/uL — ABNORMAL HIGH (ref 0.1–1.0)
Monocytes Relative: 14 %
Monocytes Relative: 13 %
Neutro Abs: 9.3 10*3/uL — ABNORMAL HIGH (ref 1.7–7.7)
Neutro Abs: 10.1 10*3/uL — ABNORMAL HIGH (ref 1.7–7.7)
Neutrophils Relative %: 73 %
Neutrophils Relative %: 76 %
Platelets: 510 10*3/uL — ABNORMAL HIGH (ref 150–400)
Platelets: 501 10*3/uL — ABNORMAL HIGH (ref 150–400)
RBC: 1.95 MIL/uL — ABNORMAL LOW (ref 3.87–5.11)
RBC: 2.01 MIL/uL — ABNORMAL LOW (ref 3.87–5.11)
RDW: 20.9 % — ABNORMAL HIGH (ref 11.5–15.5)
RDW: 21.1 % — ABNORMAL HIGH (ref 11.5–15.5)
WBC: 12.5 10*3/uL — ABNORMAL HIGH (ref 4.0–10.5)
WBC: 13.1 10*3/uL — ABNORMAL HIGH (ref 4.0–10.5)
nRBC: 0.2 % (ref 0.0–0.2)
nRBC: 0.2 % (ref 0.0–0.2)

## 2020-09-06 LAB — COMPREHENSIVE METABOLIC PANEL
ALT: 9 U/L (ref 0–44)
AST: 10 U/L — ABNORMAL LOW (ref 15–41)
Albumin: 2.9 g/dL — ABNORMAL LOW (ref 3.5–5.0)
Alkaline Phosphatase: 82 U/L (ref 38–126)
Anion gap: 6 (ref 5–15)
BUN: 29 mg/dL — ABNORMAL HIGH (ref 8–23)
CO2: 18 mmol/L — ABNORMAL LOW (ref 22–32)
Calcium: 8 mg/dL — ABNORMAL LOW (ref 8.9–10.3)
Chloride: 118 mmol/L — ABNORMAL HIGH (ref 98–111)
Creatinine, Ser: 1.48 mg/dL — ABNORMAL HIGH (ref 0.44–1.00)
GFR, Estimated: 36 mL/min — ABNORMAL LOW (ref >60)
Glucose, Bld: 109 mg/dL — ABNORMAL HIGH (ref 70–99)
Potassium: 3.7 mmol/L (ref 3.5–5.1)
Sodium: 142 mmol/L (ref 135–145)
Total Bilirubin: 0.5 mg/dL (ref 0.3–1.2)
Total Protein: 5.7 g/dL — ABNORMAL LOW (ref 6.5–8.1)

## 2020-09-06 LAB — PREPARE RBC (CROSSMATCH)

## 2020-09-06 LAB — POC OCCULT BLOOD, ED: Fecal Occult Bld: POSITIVE — AB

## 2020-09-06 LAB — LIPASE, BLOOD: Lipase: 20 U/L (ref 11–51)

## 2020-09-06 MED ORDER — PANTOPRAZOLE INFUSION (NEW) - SIMPLE MED
8.0000 mg/h | INTRAVENOUS | Status: DC
  Administered 2020-09-06 – 2020-09-08 (×5): 8 mg/h via INTRAVENOUS
  Filled 2020-09-06 (×4): qty 80
  Filled 2020-09-06: qty 100
  Filled 2020-09-06: qty 80

## 2020-09-06 MED ORDER — ONDANSETRON HCL 4 MG PO TABS: 4.0000 mg | ORAL_TABLET | Freq: Four times a day (QID) | ORAL | Status: DC | PRN

## 2020-09-06 MED ORDER — ACETAMINOPHEN 325 MG PO TABS
650.0000 mg | ORAL_TABLET | Freq: Four times a day (QID) | ORAL | Status: DC | PRN
  Filled 2020-09-06: qty 2

## 2020-09-06 MED ORDER — ONDANSETRON HCL 4 MG/2ML IJ SOLN: 4.0000 mg | Freq: Four times a day (QID) | INTRAMUSCULAR | Status: DC | PRN

## 2020-09-06 MED ORDER — ACETAMINOPHEN 650 MG RE SUPP: 650.0000 mg | Freq: Four times a day (QID) | RECTAL | Status: DC | PRN

## 2020-09-06 MED ORDER — PANTOPRAZOLE 80MG IVPB - SIMPLE MED
80.0000 mg | Freq: Once | INTRAVENOUS | Status: AC
  Administered 2020-09-06: 19:00:00 80 mg via INTRAVENOUS
  Filled 2020-09-06: qty 80

## 2020-09-06 MED ORDER — SODIUM CHLORIDE 0.9 % IV SOLN
10.0000 mL/h | Freq: Once | INTRAVENOUS | Status: AC
  Administered 2020-09-06: 19:00:00 10 mL/h via INTRAVENOUS

## 2020-09-06 MED ORDER — FUROSEMIDE 10 MG/ML IJ SOLN
20.0000 mg | Freq: Once | INTRAMUSCULAR | Status: AC
  Administered 2020-09-07: 20 mg via INTRAVENOUS
  Filled 2020-09-06: qty 2

## 2020-09-06 NOTE — ED Provider Notes (Addendum)
Emergency Medicine Provider Triage Evaluation Note  Shannon Terry , a 80 y.o. female  was evaluated in triage.  Pt complains of decreased appetite, energy and dark stool.  She states been going on for few days.  When asked why she is here she states because of the crazy people that made her come which she tells me is her family.  Patient admits to taking ibuprofen as needed for pain.  Denies any alcohol consumption.  She states her bowel movements initially had some bright red blood however it has now become black.  Has history of blood transfusions. Does not remember what the last one was  Review of Systems  Positive: Generalized weakness, trace appetite, blood in stool Negative: Fever, chills, chest pain or shortness of breath.  Physical Exam  BP 113/61 (BP Location: Left Arm)   Pulse 95   Temp 98.5 F (36.9 C) (Oral)   Resp 18   SpO2 100%  Gen:   Awake, no distress   Resp:  Normal effort  MSK:   Moves extremities without difficulty  Other:  No abdominal tenderness  Medical Decision Making  Medically screening exam initiated at 4:25 PM.  Appropriate orders placed.  Jennalynn Rivard was informed that the remainder of the evaluation will be completed by another provider, this initial triage assessment does not replace that evaluation, and the importance of remaining in the ED until their evaluation is complete.   Abdominal labs and type and screen ordered.  Portions of this note were generated with [employer]. Dictation errors may occur despite best attempts at proofreading.    Barrie Folk, PA-C 09/06/20 1633    Barrie Folk, PA-C 09/06/20 1807    Daleen Bo, MD 09/06/20 2337

## 2020-09-06 NOTE — ED Provider Notes (Signed)
[facility] EMERGENCY DEPARTMENT Provider Note   CSN: [id number] Arrival date & time: 09/06/20  1611     History Chief Complaint  Patient presents with   Blood In Stools    Shannon Terry is a 80 y.o. female history of rectal GIST tumor, here presenting with decreased mental status and melena. Patient has chronic rectal bleeding and apparently had worsening episodes of rectal bleeding and melena.  She has been taking some ibuprofen as well.  I discussed extensively with her daughter who is the healthcare proxy.  She states that her mother does not want to have any treatment for her GIST tumor.  She just wants to get blood.  Daughter noticed that her mental status is getting worse recently and has been having a lot of amount of blood clots so brought her in for evaluation.  She states that she only wants blood transfusions does not want any GI work-up or surgical or oncologic work-up. She wants patient to be DNR   The history is provided by the patient and a relative.      Past Medical History:  Diagnosis Date   Acute renal failure (HCC)    Anasarca    Anemia, iron deficiency    chronic blood loss   Gastritis without bleeding    GERD (gastroesophageal reflux disease)    GI bleeding    hx of   Hematochezia    Prior to Diagniosis   Hemorrhoids    History of blood transfusion 04/01/12   Hgb. 2.9/ 6 Units of PRBC   Iron deficiency anemia due to chronic blood loss 04/05/12   Malignant GIST ([place]) 05/30/2012   Obesity    BMI 42 Jan 2014.    Rectal cancer ([place]) 04/03/12   gastrointestinal stromal tumor    Patient Active Problem List   Diagnosis Date Noted   Symptomatic anemia 09/06/2020   Chronic kidney disease, stage 3b ([place]) 09/06/2020   Acute blood loss anemia 05/03/2020   AKI (acute kidney injury) ([place]) 05/03/2020   Malignant cachexia ([place]) 05/03/2020   Hemiplegia and hemiparesis following cerebral infarction affecting left non-dominant side ([place]) 05/03/2020    Chronic diastolic CHF (congestive heart failure) ([place]) 05/03/2020   GI bleed 05/11/2019   GIB (gastrointestinal bleeding) 11/10/2018   Left leg weakness 11/15/2015   History of CVA (cerebrovascular accident) 11/15/2015   Rectal carcinoma ([place])    Acute CVA (cerebrovascular accident) ([place]) 04/08/2015   Hypertension 04/07/2015   Malignant GIST ([place]) 05/30/2012   GERD (gastroesophageal reflux disease)    GI bleeding    Iron deficiency anemia due to chronic blood loss 04/01/2012    Past Surgical History:  Procedure Laterality Date   ABDOMINAL HYSTERECTOMY     Partial   BIOPSY STOMACH  04/03/12   neg for malignancy   COLONOSCOPY  04/03/2012   Procedure: COLONOSCOPY;  Surgeon: Jerene Bears, MD;  Location: [facility];  Service: Gastroenterology;  Laterality: N/A;   ESOPHAGOGASTRODUODENOSCOPY  04/03/2012   Procedure: ESOPHAGOGASTRODUODENOSCOPY (EGD);  Surgeon: Jerene Bears, MD;  Location: [facility];  Service: Gastroenterology;  Laterality: N/A;   EUS  04/08/2012   Procedure: LOWER ENDOSCOPIC ULTRASOUND (EUS);  Surgeon: Milus Banister, MD;  Location: [facility];  Service: Endoscopy;  Laterality: N/A;   RECTAL BIOPSY  04/03/12   gastrointestinal stromal tumor     OB History   No obstetric history on file.    Obstetric Comments  No HRT         Family  History  Problem Relation Age of Onset   Diabetes Mellitus II Father        died from stroke   Leukemia Brother    Pancreatic cancer Sister    Heart attack Brother     Social History   Tobacco Use   Smoking status: Never   Smokeless tobacco: Never   Tobacco comments:    never used tobacco  Substance Use Topics   Alcohol use: No   Drug use: No    Home Medications Prior to Admission medications   Medication Sig Start Date End Date Taking? Authorizing Provider  acetaminophen (TYLENOL) 500 MG tablet Take 1,000 mg by mouth every 6 (six) hours as needed for mild pain.    [provider]  carvedilol (COREG) 3.125  MG tablet Take 1 tablet (3.125 mg total) by mouth 2 (two) times daily with a meal. 05/07/20 06/06/20  Antonieta Pert, MD  pantoprazole (PROTONIX) 40 MG tablet Take 1 tablet (40 mg total) by mouth daily. 11/13/18 12/13/18  Little Ishikawa, MD  traMADol (ULTRAM) 50 MG tablet Take 1 tablet (50 mg total) by mouth every 6 (six) hours as needed for up to 12 doses. 05/07/20   Antonieta Pert, MD    Allergies    Patient has no known allergies.  Review of Systems   Review of Systems  Constitutional:  Positive for fatigue.  Gastrointestinal:  Positive for blood in stool.  Psychiatric/Behavioral:  Positive for confusion.   All other systems reviewed and are negative.  Physical Exam Updated Vital Signs BP 125/64   Pulse 89   Temp 98.3 F (36.8 C) (Oral)   Resp 16   SpO2 100%   Physical Exam Vitals and nursing note reviewed.  Constitutional:      Comments: Pale and confused and fatigued  HENT:     Head: Normocephalic.     Mouth/Throat:     Mouth: Mucous membranes are dry.  Eyes:     Comments: Conjunctiva is pale  Cardiovascular:     Rate and Rhythm: Normal rate and regular rhythm.     Pulses: Normal pulses.  Pulmonary:     Effort: Pulmonary effort is normal.     Breath sounds: Normal breath sounds.  Abdominal:     General: Abdomen is flat.     Palpations: Abdomen is soft.  Genitourinary:    Comments: Large amount of melena in the diaper Musculoskeletal:     Cervical back: Normal range of motion and neck supple.  Skin:    General: Skin is warm.     Capillary Refill: Capillary refill takes less than 2 seconds.  Neurological:     Comments: Confused and disoriented. Moving all extremities   Psychiatric:        Mood and Affect: Mood normal.        Behavior: Behavior normal.    ED Results / Procedures / Treatments   Labs (all labs ordered are listed, but only abnormal results are displayed) Labs Reviewed  COMPREHENSIVE METABOLIC PANEL - Abnormal; Notable for the following components:       Result Value   Chloride 118 (*)    CO2 18 (*)    Glucose, Bld 109 (*)    BUN 29 (*)    Creatinine, Ser 1.48 (*)    Calcium 8.0 (*)    Total Protein 5.7 (*)    Albumin 2.9 (*)    AST 10 (*)    GFR, Estimated 36 (*)    All other  components within normal limits  CBC WITH DIFFERENTIAL/PLATELET - Abnormal; Notable for the following components:   WBC 12.5 (*)    RBC 1.95 (*)    Hemoglobin 2.6 (*)    HCT 10.4 (*)    MCV 53.3 (*)    MCH 13.3 (*)    MCHC 25.0 (*)    RDW 20.9 (*)    Platelets 510 (*)    Neutro Abs 9.3 (*)    Monocytes Absolute 1.8 (*)    All other components within normal limits  CBC WITH DIFFERENTIAL/PLATELET - Abnormal; Notable for the following components:   WBC 13.1 (*)    RBC 2.01 (*)    Hemoglobin 2.7 (*)    HCT 10.9 (*)    MCV 54.2 (*)    MCH 13.4 (*)    MCHC 24.8 (*)    RDW 21.1 (*)    Platelets 501 (*)    Neutro Abs 10.1 (*)    Monocytes Absolute 1.7 (*)    All other components within normal limits  POC OCCULT BLOOD, ED - Abnormal; Notable for the following components:   Fecal Occult Bld POSITIVE (*)    All other components within normal limits  LIPASE, BLOOD  BASIC METABOLIC PANEL  TYPE AND SCREEN  PREPARE RBC (CROSSMATCH)    EKG None  Radiology No results found.  Procedures Procedures   CRITICAL CARE Performed by: Wandra Arthurs   Total critical care time:40 minutes  Critical care time was exclusive of separately billable procedures and treating other patients.  Critical care was necessary to treat or prevent imminent or life-threatening deterioration.  Critical care was time spent personally by me on the following activities: development of treatment plan with patient and/or surrogate as well as nursing, discussions with consultants, evaluation of patient's response to treatment, examination of patient, obtaining history from patient or surrogate, ordering and performing treatments and interventions, ordering and review of laboratory  studies, ordering and review of radiographic studies, pulse oximetry and re-evaluation of patient's condition.    Medications Ordered in ED Medications  pantoprozole (PROTONIX) 80 mg /NS 100 mL infusion (8 mg/hr Intravenous New Bag/Given 09/06/20 1958)  furosemide (LASIX) injection 20 mg (has no administration in time range)  acetaminophen (TYLENOL) tablet 650 mg (has no administration in time range)    Or  acetaminophen (TYLENOL) suppository 650 mg (has no administration in time range)  ondansetron (ZOFRAN) tablet 4 mg (has no administration in time range)    Or  ondansetron (ZOFRAN) injection 4 mg (has no administration in time range)  0.9 %  sodium chloride infusion (10 mL/hr Intravenous New Bag/Given 09/06/20 1846)  pantoprazole (PROTONIX) 80 mg /NS 100 mL IVPB (0 mg Intravenous Stopped 09/06/20 1957)    ED Course  I have reviewed the triage vital signs and the nursing notes.  Pertinent labs & imaging results that were available during my care of the patient were reviewed by me and considered in my medical decision making (see chart for details).    MDM Rules/Calculators/A&P                         Rikita Grabert is a 80 y.o. female here presenting with melena and fatigue.  Patient has history of recurrent GI bleed from GIST tumor.  Per patient and her daughter, they are very adamant that they just want to receive blood.  They do not want to get another CT or get treatment for the GIST tumor.  Patient appears very pale in the hemoglobin 2.6.  She is on ibuprofen as well so consider upper and addition to lower GI bleed.  We will put patient on Protonix as well as transfused 4 units PRBC. Patient is DNR.  Will admit for symptomatic anemia     Final Clinical Impression(s) / ED Diagnoses Final diagnoses:  None    Rx / DC Orders ED Discharge Orders     None        Drenda Freeze, MD 09/06/20 2041

## 2020-09-06 NOTE — ED Triage Notes (Signed)
Patient brought in by EMS from home. Patient states she is here "cause the crazy people brought me here." According to family patient has been c/o no appetite, fatigue and dark stools.

## 2020-09-06 NOTE — H&P (Signed)
History and Physical    Shannon Terry YDX:[id number] DOB: 09-Jan-1941 DOA: 09/06/2020  PCP: Patient, No Pcp Per (Inactive)   Patient coming from: Home   Chief Complaint: Melena   HPI: Shannon Terry is a 80 y.o. female with medical history significant for malignant GIST with rectal mass, history of CVA, chronic diastolic CHF, hypertension, and recurrent GI blood loss, now presenting to the emergency department with melena and progressive fatigue.  Patient reports history of bright red blood per rectum and was admitted for this most recently in March 2022 but denies having black stools until yesterday.  She denies any abdominal pain or vomiting but has continued to have black stool and progressive fatigue over the last 2 days.  Patient lives at home with her husband, and is normally able to care for herself and perform all ADLs but has not been able to do this now due to her fatigue. She is no longer interested in treatment or monitoring of her cancer and is adamant that she does not want any further imaging or investigation of her new melena but only wants to be transfused and go home. Her daughter at the bedside is supportive of her decision.  Home health was arranged during her admission in March but she felt better after being transfused and did not allow the home health RN to visit her.  She believes that she will be able to care for herself independently again after she is transfused this admission and is not interested in any home health services.  ED Course: Upon arrival to the ED, patient is found to be afebrile, saturating well on room air, and with blood pressure 100/54.  Chemistry panel notable for creatinine 1.48.  CBC with WBC 12,500, platelets 510,000, and hemoglobin 2.6, down from 8.5 after she was transfused in March.  4 units of packed red blood cells were ordered for transfusion and patient was also started on IV Protonix.  Blood pressure improved to 720N systolic without intervention.  Review  of Systems:  All other systems reviewed and apart from HPI, are negative.  Past Medical History:  Diagnosis Date   Acute renal failure (HCC)    Anasarca    Anemia, iron deficiency    chronic blood loss   Gastritis without bleeding    GERD (gastroesophageal reflux disease)    GI bleeding    hx of   Hematochezia    Prior to Diagniosis   Hemorrhoids    History of blood transfusion 04/01/12   Hgb. 2.9/ 6 Units of PRBC   Iron deficiency anemia due to chronic blood loss 04/05/12   Malignant GIST ([place]) 05/30/2012   Obesity    BMI 42 Jan 2014.    Rectal cancer ([place]) 04/03/12   gastrointestinal stromal tumor    Past Surgical History:  Procedure Laterality Date   ABDOMINAL HYSTERECTOMY     Partial   BIOPSY STOMACH  04/03/12   neg for malignancy   COLONOSCOPY  04/03/2012   Procedure: COLONOSCOPY;  Surgeon: Jerene Bears, MD;  Location: [facility];  Service: Gastroenterology;  Laterality: N/A;   ESOPHAGOGASTRODUODENOSCOPY  04/03/2012   Procedure: ESOPHAGOGASTRODUODENOSCOPY (EGD);  Surgeon: Jerene Bears, MD;  Location: [facility];  Service: Gastroenterology;  Laterality: N/A;   EUS  04/08/2012   Procedure: LOWER ENDOSCOPIC ULTRASOUND (EUS);  Surgeon: Milus Banister, MD;  Location: [facility];  Service: Endoscopy;  Laterality: N/A;   RECTAL BIOPSY  04/03/12   gastrointestinal stromal tumor    Social History:  reports that she has never smoked. She has never used smokeless tobacco. She reports that she does not drink alcohol and does not use drugs.  No Known Allergies  Family History  Problem Relation Age of Onset   Diabetes Mellitus II Father        died from stroke   Leukemia Brother    Pancreatic cancer Sister    Heart attack Brother      Prior to Admission medications   Medication Sig Start Date End Date Taking? Authorizing Provider  acetaminophen (TYLENOL) 500 MG tablet Take 1,000 mg by mouth every 6 (six) hours as needed for mild pain.    [provider]   carvedilol (COREG) 3.125 MG tablet Take 1 tablet (3.125 mg total) by mouth 2 (two) times daily with a meal. 05/07/20 06/06/20  Antonieta Pert, MD  pantoprazole (PROTONIX) 40 MG tablet Take 1 tablet (40 mg total) by mouth daily. 11/13/18 12/13/18  Little Ishikawa, MD  traMADol (ULTRAM) 50 MG tablet Take 1 tablet (50 mg total) by mouth every 6 (six) hours as needed for up to 12 doses. 05/07/20   Antonieta Pert, MD    Physical Exam: Vitals:   09/06/20 1951 09/06/20 1952 09/06/20 2000 09/06/20 2015  BP: (!) 123/59  (!) 137/58 125/64  Pulse: 88  90 89  Resp: 16  15 16   Temp:  98.7 F (37.1 C) 98.3 F (36.8 C)   TempSrc: Oral  Oral   SpO2: 100%  100% 100%    Constitutional: NAD, calm, appears frail   Eyes: PERTLA, lids and conjunctivae normal ENMT: Mucous membranes are moist. Posterior pharynx clear of any exudate or lesions.   Neck:  supple, no masses  Respiratory:  no wheezing, no crackles. No accessory muscle use.  Cardiovascular: S1 & S2 heard, regular rate and rhythm. No extremity edema.   Abdomen: No distension, no tenderness, soft. Bowel sounds active.  Musculoskeletal: no clubbing / cyanosis. No joint deformity upper and lower extremities.   Skin: no significant rashes, lesions, ulcers. Warm, dry, well-perfused. Neurologic: CN 2-12 grossly intact. Sensation intact. Moving all extremities.  Psychiatric: Alert and oriented to person, place, and situation. Very pleasant and cooperative.    Labs and Imaging on Admission: I have personally reviewed following labs and imaging studies  CBC: Recent Labs  Lab 09/06/20 1628 09/06/20 1811  WBC 12.5* 13.1*  NEUTROABS 9.3* 10.1*  HGB 2.6* 2.7*  HCT 10.4* 10.9*  MCV 53.3* 54.2*  PLT 510* 937*   Basic Metabolic Panel: Recent Labs  Lab 09/06/20 1628  NA 142  K 3.7  CL 118*  CO2 18*  GLUCOSE 109*  BUN 29*  CREATININE 1.48*  CALCIUM 8.0*   GFR: CrCl cannot be calculated (Unknown ideal weight.). Liver Function Tests: Recent Labs   Lab 09/06/20 1628  AST 10*  ALT 9  ALKPHOS 82  BILITOT 0.5  PROT 5.7*  ALBUMIN 2.9*   Recent Labs  Lab 09/06/20 1628  LIPASE 20   No results for input(s): AMMONIA in the last 168 hours. Coagulation Profile: No results for input(s): INR, PROTIME in the last 168 hours. Cardiac Enzymes: No results for input(s): CKTOTAL, CKMB, CKMBINDEX, TROPONINI in the last 168 hours. BNP (last 3 results) No results for input(s): PROBNP in the last 8760 hours. HbA1C: No results for input(s): HGBA1C in the last 72 hours. CBG: No results for input(s): GLUCAP in the last 168 hours. Lipid Profile: No results for input(s): CHOL, HDL, LDLCALC, TRIG, CHOLHDL, LDLDIRECT in  the last 72 hours. Thyroid Function Tests: No results for input(s): TSH, T4TOTAL, FREET4, T3FREE, THYROIDAB in the last 72 hours. Anemia Panel: No results for input(s): VITAMINB12, FOLATE, FERRITIN, TIBC, IRON, RETICCTPCT in the last 72 hours. Urine analysis:    Component Value Date/Time   COLORURINE YELLOW 11/10/2018 1250   APPEARANCEUR CLEAR 11/10/2018 1250   LABSPEC 1.013 11/10/2018 1250   PHURINE 5.0 11/10/2018 1250   GLUCOSEU NEGATIVE 11/10/2018 1250   HGBUR NEGATIVE 11/10/2018 1250   BILIRUBINUR NEGATIVE 11/10/2018 1250   KETONESUR NEGATIVE 11/10/2018 1250   PROTEINUR NEGATIVE 11/10/2018 1250   UROBILINOGEN 1.0 04/01/2012 0100   NITRITE NEGATIVE 11/10/2018 1250   LEUKOCYTESUR NEGATIVE 11/10/2018 1250   Sepsis Labs: @LABRCNTIP (procalcitonin:4,lacticidven:4) )No results found for this or any previous visit (from the past 240 hour(s)).   Radiological Exams on Admission: No results found.  Assessment/Plan   1. GI bleeding; symptomatic anemia  - Presents with 2 days of melena and increasing fatigue  - She reports chronic intermittent BRBPR attributed to rectal mass; now with melena and increased BUN but no vomiting or abdominal pain  - Initial Hgb is 2.6, down from 8.5 after she was transfused in March '22  -  Patient adamant that she does not want any further workup but only symptomatic treatment and discharge back home ASAP; family supports her decision  - She was started on IV PPI in ED and 4 units RBC were ordered  - Continue IV PPI, check posttransfusion H&H, discussed recommendation to avoid NSAID and take PO PPI on discharge with pt and her daughter    2. Malignant GIST  - Previously following with oncology but no longer interested in any further treatment or monitoring    3. Chronic diastolic CHF  - Compensated on admission; EF was 55% in March 2022  - She developed hypervolemia with blood transfusions in March and required IV diuresis  - Monitor volume status, give Lasix between units 2 and 3  4. CKD 3b  - SCr is 1.48 on admission, similar to priors  - Renally-dose medications, monitor    5. Hypertension  - BP low-normal in ED and antihypertensives held initially    6. History of CVA  - Hold ASA for now     DVT prophylaxis: SCDs  Code Status: DNR, confirmed with patient and family  Level of Care: Level of care: Telemetry Medical Family Communication: Daughter updated at bedside  Disposition Plan:  Patient is from: Home  Anticipated d/c is to: Home  Anticipated d/c date is: 09/08/20 Patient currently: pending blood transfusions, stability in H&H  Consults called: none  Admission status: Inpatient     Vianne Bulls, MD [facility]  09/06/2020, 8:31 PM

## 2020-09-07 LAB — BASIC METABOLIC PANEL
Anion gap: 9 (ref 5–15)
Anion gap: 9 (ref 5–15)
BUN: 38 mg/dL — ABNORMAL HIGH (ref 8–23)
BUN: 41 mg/dL — ABNORMAL HIGH (ref 8–23)
CO2: 15 mmol/L — ABNORMAL LOW (ref 22–32)
CO2: 17 mmol/L — ABNORMAL LOW (ref 22–32)
Calcium: 7.7 mg/dL — ABNORMAL LOW (ref 8.9–10.3)
Calcium: 8.1 mg/dL — ABNORMAL LOW (ref 8.9–10.3)
Chloride: 118 mmol/L — ABNORMAL HIGH (ref 98–111)
Chloride: 111 mmol/L (ref 98–111)
Creatinine, Ser: 1.8 mg/dL — ABNORMAL HIGH (ref 0.44–1.00)
Creatinine, Ser: 2.02 mg/dL — ABNORMAL HIGH (ref 0.44–1.00)
GFR, Estimated: 28 mL/min — ABNORMAL LOW (ref >60)
GFR, Estimated: 25 mL/min — ABNORMAL LOW (ref >60)
Glucose, Bld: 134 mg/dL — ABNORMAL HIGH (ref 70–99)
Glucose, Bld: 117 mg/dL — ABNORMAL HIGH (ref 70–99)
Potassium: 5.6 mmol/L — ABNORMAL HIGH (ref 3.5–5.1)
Potassium: 4.3 mmol/L (ref 3.5–5.1)
Sodium: 142 mmol/L (ref 135–145)
Sodium: 137 mmol/L (ref 135–145)

## 2020-09-07 LAB — CBC
HCT: 25.1 % — ABNORMAL LOW (ref 36.0–46.0)
Hemoglobin: 8.2 g/dL — ABNORMAL LOW (ref 12.0–15.0)
MCH: 23 pg — ABNORMAL LOW (ref 26.0–34.0)
MCHC: 32.7 g/dL (ref 30.0–36.0)
MCV: 70.5 fL — ABNORMAL LOW (ref 80.0–100.0)
Platelets: 315 10*3/uL (ref 150–400)
RBC: 3.56 MIL/uL — ABNORMAL LOW (ref 3.87–5.11)
RDW: 26.7 % — ABNORMAL HIGH (ref 11.5–15.5)
WBC: 25.4 10*3/uL — ABNORMAL HIGH (ref 4.0–10.5)
nRBC: 0.5 % — ABNORMAL HIGH (ref 0.0–0.2)

## 2020-09-07 LAB — HEMOGLOBIN AND HEMATOCRIT, BLOOD
HCT: 28 % — ABNORMAL LOW (ref 36.0–46.0)
Hemoglobin: 8.9 g/dL — ABNORMAL LOW (ref 12.0–15.0)

## 2020-09-07 LAB — SARS CORONAVIRUS 2 (TAT 6-24 HRS): SARS Coronavirus 2: NEGATIVE

## 2020-09-07 MED ORDER — OXYCODONE HCL 5 MG PO TABS
5.0000 mg | ORAL_TABLET | ORAL | Status: DC | PRN
Start: 2020-09-07 — End: 2020-09-08
  Administered 2020-09-07: 5 mg via ORAL
  Filled 2020-09-07: qty 1

## 2020-09-07 NOTE — Progress Notes (Addendum)
PROGRESS NOTE    Shannon Terry  ERX:[id number] DOB: 07-22-1940 DOA: 09/06/2020 PCP: Patient, No Pcp Per (Inactive)   Chief Complaint  Patient presents with   Blood In Stools   Brief Narrative:  Shannon Terry is Shannon Terry 80 y.o. female with medical history significant for malignant GIST with rectal mass, history of CVA, chronic diastolic CHF, hypertension, and recurrent GI blood loss, now presenting to the emergency department with melena and progressive fatigue.  Patient reports history of bright red blood per rectum and was admitted for this most recently in March 2022 but denies having black stools until yesterday.  She denies any abdominal pain or vomiting but has continued to have black stool and progressive fatigue over the last 2 days.  Patient lives at home with her husband, and is normally able to care for herself and perform all ADLs but has not been able to do this now due to her fatigue. She is no longer interested in treatment or monitoring of her cancer and is adamant that she does not want any further imaging or investigation of her new melena but only wants to be transfused and go home. Her daughter at the bedside is supportive of her decision.  Home health was arranged during her admission in March but she felt better after being transfused and did not allow the home health RN to visit her.  She believes that she will be able to care for herself independently again after she is transfused this admission and is not interested in any home health services.   Assessment & Plan:   Principal Problem:   Symptomatic anemia Active Problems:   GI bleeding   Malignant GIST (HCC)   Hypertension   History of CVA (cerebrovascular accident)   Chronic diastolic CHF (congestive heart failure) (HCC)   Chronic kidney disease, stage 3b ([place])  # Goals of care: she's not interested in hospice or in following with PCP and/or oncology for serial Hb and transfusions as needed.  She said she just wants to keep  things "simple".  She confirms DNR.  For this admission, will monitor until Hb stable and bleeding improves, after this will plan to discharge home, possibly 7/6 if stable after her 4 units pRBC's on presentation.   1. GI bleeding; symptomatic anemia  History of Rectal GIST - Presents with 2 days of melena and increasing fatigue  - She reports chronic intermittent BRBPR attributed to rectal mass; now with melena and increased BUN but no vomiting or abdominal pain - Initial Hgb is 2.6, down from 8.5 after she was transfused in March '22  - s/p 4 units pRBC -> 8.9.  She continues to have some bleeding - continue PPI gtt for now - will continue to monitor, possible d/c 7/6 if Hb stable and bleeding slows - discussed goals of care at length, offered to arrange follow up with PCP/oncology for intermittent labs and transfusion as needed to try to avoid hospital stays, but she was not interested in this.  Not interested in hospice either.   - Discussed recommendation to stop aspirin.    # Acute Kidney Injury on CKD 3b  hyperkalemia - aki likely 2/2 severe anemia/hypovolemia - hyperkalemia likely related to AKI - also had lasix  - trend  3. Chronic diastolic CHF  - Compensated on admission; EF was 55% in March 2022  - She developed hypervolemia with blood transfusions in March and required IV diuresis  - s/p lasix with transfusions this am - monitor volume  status   5. Hypertension  - BP low-normal in ED and antihypertensives held initially     6. History of CVA  - Hold ASA for now - would discontinue this given risk of bleeding in setting of GIST  DVT prophylaxis: SCD Code Status: DNR Family Communication: husband and daughter at bedside Disposition:   Status is: Inpatient  Remains inpatient appropriate because:Inpatient level of care appropriate due to severity of illness  Dispo: The patient is from: Home              Anticipated d/c is to: Home              Patient currently is  not medically stable to d/c.   Difficult to place patient No       Consultants:  none  Procedures:  none  Antimicrobials:  Anti-infectives (From admission, onward)    None          Subjective: No new complaints Clear in her desire not to follow up with any physicians "I just want to keep it simple"  Objective: Vitals:   09/07/20 1430 09/07/20 1445 09/07/20 1500 09/07/20 1531  BP: (!) 138/100 (!) 157/83 129/64 135/82  Pulse: 93 98 96 97  Resp: 14 20 (!) 22 18  Temp:    98.2 F (36.8 C)  TempSrc:    Oral  SpO2: 100% 100% 100% 100%  Weight:    57.8 kg  Height:    5\' 4"  (1.626 m)    Intake/Output Summary (Last 24 hours) at 09/07/2020 1600 Last data filed at 09/07/2020 0714 Gross per 24 hour  Intake 1260 ml  Output --  Net 1260 ml   Filed Weights   09/07/20 1531  Weight: 57.8 kg    Examination:  General exam: Appears calm and comfortable  Respiratory system: Clear to auscultation. Respiratory effort normal. Cardiovascular system: S1 & S2 heard, RRR.  Gastrointestinal system: Abdomen is nondistended, soft and nontender.  Central nervous system: Alert and oriented. No focal neurological deficits. Extremities: no LEE    Data Reviewed: I have personally reviewed following labs and imaging studies  CBC: Recent Labs  Lab 09/06/20 1628 09/06/20 1811 09/07/20 0822  WBC 12.5* 13.1*  --   NEUTROABS 9.3* 10.1*  --   HGB 2.6* 2.7* 8.9*  HCT 10.4* 10.9* 28.0*  MCV 53.3* 54.2*  --   PLT 510* 501*  --     Basic Metabolic Panel: Recent Labs  Lab 09/06/20 1628 09/07/20 0500  NA 142 142  K 3.7 5.6*  CL 118* 118*  CO2 18* 15*  GLUCOSE 109* 134*  BUN 29* 38*  CREATININE 1.48* 1.80*  CALCIUM 8.0* 7.7*    GFR: Estimated Creatinine Clearance: 22.2 mL/min ([place]) (by C-G formula based on SCr of 1.8 mg/dL (H)).  Liver Function Tests: Recent Labs  Lab 09/06/20 1628  AST 10*  ALT 9  ALKPHOS 82  BILITOT 0.5  PROT 5.7*  ALBUMIN 2.9*    CBG: No  results for input(s): GLUCAP in the last 168 hours.   No results found for this or any previous visit (from the past 240 hour(s)).       Radiology Studies: No results found.      Scheduled Meds: Continuous Infusions:  pantoprazole 8 mg/hr (09/07/20 1023)     LOS: 1 day    Time spent: over 30 min    Fayrene Helper, MD [facility]   To contact the attending provider between 7A-7P or the covering provider  during after hours 7P-7A, please log into the web site [email or web address] and access using universal [place] password for that web site. If you do not have the password, please call the hospital operator.  09/07/2020, 4:00 PM

## 2020-09-07 NOTE — ED Notes (Signed)
Placed Breakfast Order 

## 2020-09-08 DIAGNOSIS — Z8673 Personal history of transient ischemic attack (TIA), and cerebral infarction without residual deficits: Secondary | ICD-10-CM

## 2020-09-08 DIAGNOSIS — I5032 Chronic diastolic (congestive) heart failure: Secondary | ICD-10-CM

## 2020-09-08 DIAGNOSIS — D649 Anemia, unspecified: Secondary | ICD-10-CM

## 2020-09-08 DIAGNOSIS — N1832 Chronic kidney disease, stage 3b: Secondary | ICD-10-CM

## 2020-09-08 LAB — BPAM RBC
Blood Product Expiration Date: 202207252359
Blood Product Expiration Date: 202207262359
Blood Product Expiration Date: 202207262359
Blood Product Expiration Date: 202207262359
ISSUE DATE / TIME: 202207041939
ISSUE DATE / TIME: 202207042246
ISSUE DATE / TIME: 202207050124
ISSUE DATE / TIME: 202207050504
Unit Type and Rh: 7300
Unit Type and Rh: 7300
Unit Type and Rh: 7300
Unit Type and Rh: 7300

## 2020-09-08 LAB — COMPREHENSIVE METABOLIC PANEL
ALT: 10 U/L (ref 0–44)
AST: 8 U/L — ABNORMAL LOW (ref 15–41)
Albumin: 2.5 g/dL — ABNORMAL LOW (ref 3.5–5.0)
Alkaline Phosphatase: 83 U/L (ref 38–126)
Anion gap: 6 (ref 5–15)
BUN: 41 mg/dL — ABNORMAL HIGH (ref 8–23)
CO2: 17 mmol/L — ABNORMAL LOW (ref 22–32)
Calcium: 8.1 mg/dL — ABNORMAL LOW (ref 8.9–10.3)
Chloride: 115 mmol/L — ABNORMAL HIGH (ref 98–111)
Creatinine, Ser: 2.08 mg/dL — ABNORMAL HIGH (ref 0.44–1.00)
GFR, Estimated: 24 mL/min — ABNORMAL LOW (ref >60)
Glucose, Bld: 139 mg/dL — ABNORMAL HIGH (ref 70–99)
Potassium: 4.1 mmol/L (ref 3.5–5.1)
Sodium: 138 mmol/L (ref 135–145)
Total Bilirubin: 1.2 mg/dL (ref 0.3–1.2)
Total Protein: 4.9 g/dL — ABNORMAL LOW (ref 6.5–8.1)

## 2020-09-08 LAB — TYPE AND SCREEN
ABO/RH(D): B POS
Antibody Screen: POSITIVE
DAT, IgG: NEGATIVE
Unit division: 0
Unit division: 0
Unit division: 0
Unit division: 0

## 2020-09-08 LAB — CBC WITH DIFFERENTIAL/PLATELET
Abs Immature Granulocytes: 0 10*3/uL (ref 0.00–0.07)
Basophils Absolute: 0 10*3/uL (ref 0.0–0.1)
Basophils Relative: 0 %
Eosinophils Absolute: 0 10*3/uL (ref 0.0–0.5)
Eosinophils Relative: 0 %
HCT: 24.3 % — ABNORMAL LOW (ref 36.0–46.0)
Hemoglobin: 8 g/dL — ABNORMAL LOW (ref 12.0–15.0)
Lymphocytes Relative: 2 %
Lymphs Abs: 0.5 10*3/uL — ABNORMAL LOW (ref 0.7–4.0)
MCH: 23.3 pg — ABNORMAL LOW (ref 26.0–34.0)
MCHC: 32.9 g/dL (ref 30.0–36.0)
MCV: 70.6 fL — ABNORMAL LOW (ref 80.0–100.0)
Monocytes Absolute: 0.2 10*3/uL (ref 0.1–1.0)
Monocytes Relative: 1 %
Neutro Abs: 22 10*3/uL — ABNORMAL HIGH (ref 1.7–7.7)
Neutrophils Relative %: 97 %
Platelets: 302 10*3/uL (ref 150–400)
RBC: 3.44 MIL/uL — ABNORMAL LOW (ref 3.87–5.11)
WBC: 22.7 10*3/uL — ABNORMAL HIGH (ref 4.0–10.5)
nRBC: 0.3 % — ABNORMAL HIGH (ref 0.0–0.2)
nRBC: 1 /100 WBC — ABNORMAL HIGH

## 2020-09-08 LAB — MAGNESIUM: Magnesium: 2.4 mg/dL (ref 1.7–2.4)

## 2020-09-08 LAB — PHOSPHORUS: Phosphorus: 3.7 mg/dL (ref 2.5–4.6)

## 2020-09-08 MED ORDER — FERROUS SULFATE 325 (65 FE) MG PO TBEC: 325.0000 mg | DELAYED_RELEASE_TABLET | Freq: Two times a day (BID) | ORAL | 3 refills | Status: DC

## 2020-09-08 MED ORDER — ACETAMINOPHEN 325 MG PO TABS: 650.0000 mg | ORAL_TABLET | Freq: Four times a day (QID) | ORAL | Status: DC | PRN

## 2020-09-08 MED ORDER — PANTOPRAZOLE SODIUM 40 MG PO TBEC: 40.0000 mg | DELAYED_RELEASE_TABLET | Freq: Every day | ORAL | 1 refills | Status: DC

## 2020-09-08 NOTE — Discharge Summary (Signed)
PATIENT DETAILS Name: Shannon Terry Age: 80 y.o. Sex: female Date of Birth: 12/01/1940 MRN: [id number]. Admitting Physician: Vianne Bulls, MD [id number], No Pcp Per (Inactive)  Admit Date: 09/06/2020 Discharge date: 09/08/2020  Recommendations for Outpatient Follow-up:  Follow up with PCP in 1-2 weeks Please obtain CMP/CBC in one week   Admitted From:  Home  Disposition: [place]: No  Equipment/Devices: None  Discharge Condition: Stable  CODE STATUS: FULL CODE  Diet recommendation:  Diet Order             Diet general           Diet Heart Room service appropriate? Yes; Fluid consistency: Thin  Diet effective now                    Brief Summary: See H&P, Labs, Consult and Test reports for all details in brief, is a 80 y.o. female with medical history significant for malignant GIST with rectal mass, history of CVA, chronic diastolic CHF, hypertension, and recurrent GI blood loss, now presenting to the emergency department with melena and progressive fatigue-she was found to have a hemoglobin of 2.6-and admitted to the hospitalist service.  See below for further details.  Brief Hospital Course: Lower GI bleeding with acute blood loss anemia: Presented with a hemoglobin of 2.6-she was transfused a total of 4 units of PRBC, hemoglobin stable at 8.0.  She denies any active melena or hematochezia to this MD this morning.  She is interested in going home today.  Patient is not interested in pursuing therapy or interested in hospice or even interested in following with her PCP for close monitoring of her CBCs so that she can get transfusion as needed.  She told prior MD-Dr. Roe Rutherford she just wanted to keep things "simple".  This MD this morning reconfirmed her desire-I have asked her to touch base with primary care practitioner if she changes her mind.  She is well aware that she will most likely require repeat transfusion in the next few weeks.  Difficult  situation-but I suspect she is as good as she can get at this point-and is stable for discharge home at her own request.  AKI on CKD stage IIIb: AKI likely hemodynamically mediated-in the setting of blood loss.  Creatinine slowly improving-if she agrees to follow-up with PCP-suspect she will benefit from a repeat chemistry panel check in the next few weeks.  Avoid nephrotoxic agents.  Chronic diastolic heart failure: Volume status is stable.  History of CVA: Would avoid ASA given risk of recurrent bleeding.  History of malignant rectal GIST: Apparently diagnosed in 2014-she was previously followed by oncology-however she has subsequently refused to follow-up with oncology-and does not want to discuss with hospice either.  Goals of care: she's not interested in hospice or in following with PCP and/or oncology for serial Hb and transfusions as needed.  She said she just wants to keep things "simple".  She confirms DNR   Procedures None  Discharge Diagnoses:  Principal Problem:   Symptomatic anemia Active Problems:   GI bleeding   Malignant GIST (HCC)   Hypertension   History of CVA (cerebrovascular accident)   Chronic diastolic CHF (congestive heart failure) (HCC)   Chronic kidney disease, stage 3b [facility])   Discharge Instructions:  Activity:  As tolerated with Full fall precautions use walker/cane & assistance as needed  Discharge Instructions     Call MD for:   Complete by: As directed  Recurrent bloody stools or black stools.   Call MD for:  difficulty breathing, headache or visual disturbances   Complete by: As directed    Call MD for:  extreme fatigue   Complete by: As directed    Call MD for:  persistant dizziness or light-headedness   Complete by: As directed    Diet general   Complete by: As directed    Discharge instructions   Complete by: As directed    Follow with Primary MD  in 1 week for repeat CBC check-it is more likely than not that he will probably  require repeat transfusion in the next few weeks.  Please get a complete blood count and chemistry panel checked by your Primary MD at your next visit, and again as instructed by your Primary MD.  Get Medicines reviewed and adjusted: Please take all your medications with you for your next visit with your Primary MD  Laboratory/radiological data: Please request your Primary MD to go over all hospital tests and procedure/radiological results at the follow up, please ask your Primary MD to get all Hospital records sent to his/her office.  In some cases, they will be blood work, cultures and biopsy results pending at the time of your discharge. Please request that your primary care M.D. follows up on these results.  Also Note the following: If you experience worsening of your admission symptoms, develop shortness of breath, life threatening emergency, suicidal or homicidal thoughts you must seek medical attention immediately by calling 911 or calling your MD immediately  if symptoms less severe.  You must read complete instructions/literature along with all the possible adverse reactions/side effects for all the Medicines you take and that have been prescribed to you. Take any new Medicines after you have completely understood and accpet all the possible adverse reactions/side effects.   Do not drive when taking Pain medications or sleeping medications (Benzodaizepines)  Do not take more than prescribed Pain, Sleep and Anxiety Medications. It is not advisable to combine anxiety,sleep and pain medications without talking with your primary care practitioner  Special Instructions: If you have smoked or chewed Tobacco  in the last 2 yrs please stop smoking, stop any regular Alcohol  and or any Recreational drug use.  Wear Seat belts while driving.  Please note: You were cared for by a hospitalist during your hospital stay. Once you are discharged, your primary care physician will handle any further  medical issues. Please note that NO REFILLS for any discharge medications will be authorized once you are discharged, as it is imperative that you return to your primary care physician (or establish a relationship with a primary care physician if you do not have one) for your post hospital discharge needs so that they can reassess your need for medications and monitor your lab values.   Increase activity slowly   Complete by: As directed       Allergies as of 09/08/2020   No Known Allergies      Medication List     STOP taking these medications    aspirin EC 81 MG tablet   carvedilol 3.125 MG tablet Commonly known as: COREG   ibuprofen 200 MG tablet Commonly known as: ADVIL   traMADol 50 MG tablet Commonly known as: Ultram       TAKE these medications    acetaminophen 325 MG tablet Commonly known as: TYLENOL Take 2 tablets (650 mg total) by mouth every 6 (six) hours as needed for mild pain (or  Fever >/= 101).   ferrous sulfate 325 (65 FE) MG EC tablet Take 1 tablet (325 mg total) by mouth 2 (two) times daily.   pantoprazole 40 MG tablet Commonly known as: Protonix Take 1 tablet (40 mg total) by mouth daily.        No Known Allergies    Consultations: None   Other Procedures/Studies: No results found.   TODAY-DAY OF DISCHARGE:  Subjective:   Shannon Terry today has no headache,no chest abdominal pain,no new weakness tingling or numbness, feels much better wants to go home today.   Objective:   Blood pressure 115/67, pulse 93, temperature 97.8 F (36.6 C), resp. rate 15, height 5\' 4"  (1.626 m), weight 57.8 kg, SpO2 97 %. No intake or output data in the 24 hours ending 09/08/20 1144 Filed Weights   09/07/20 1531  Weight: 57.8 kg    Exam: Awake Alert, Oriented *3, No new F.N deficits, Normal affect [place].AT,PERRAL Supple Neck,No JVD, No cervical lymphadenopathy appriciated.  Symmetrical Chest wall movement, Good air movement bilaterally, CTAB RRR,No  Gallops,Rubs or new Murmurs, No Parasternal Heave +ve B.Sounds, Abd Soft, Non tender, No organomegaly appriciated, No rebound -guarding or rigidity. No Cyanosis, Clubbing or edema, No new Rash or bruise   PERTINENT RADIOLOGIC STUDIES: No results found.   PERTINENT LAB RESULTS: CBC: Recent Labs    09/07/20 1646 09/08/20 0106  WBC 25.4* 22.7*  HGB 8.2* 8.0*  HCT 25.1* 24.3*  PLT 315 302   CMET CMP     Component Value Date/Time   NA 138 09/08/2020 0106   NA 143 06/20/2013 1052   K 4.1 09/08/2020 0106   K 3.6 06/20/2013 1052   CL 115 (H) 09/08/2020 0106   CL 106 06/20/2013 1052   CO2 17 (L) 09/08/2020 0106   CO2 31 06/20/2013 1052   GLUCOSE 139 (H) 09/08/2020 0106   GLUCOSE 86 06/20/2013 1052   BUN 41 (H) 09/08/2020 0106   BUN 15 06/20/2013 1052   CREATININE 2.08 (H) 09/08/2020 0106   CREATININE 0.8 06/20/2013 1052   CALCIUM 8.1 (L) 09/08/2020 0106   CALCIUM 9.5 06/20/2013 1052   PROT 4.9 (L) 09/08/2020 0106   PROT 7.7 06/20/2013 1052   ALBUMIN 2.5 (L) 09/08/2020 0106   ALBUMIN 4.0 06/20/2013 1052   AST 8 (L) 09/08/2020 0106   AST 31 06/20/2013 1052   ALT 10 09/08/2020 0106   ALT 22 06/20/2013 1052   ALKPHOS 83 09/08/2020 0106   ALKPHOS 104 (H) 06/20/2013 1052   BILITOT 1.2 09/08/2020 0106   BILITOT 0.60 06/20/2013 1052   GFRNONAA 24 (L) 09/08/2020 0106   GFRAA 50 (L) 05/12/2019 0231    GFR Estimated Creatinine Clearance: 19.2 mL/min (A) (by C-G formula based on SCr of 2.08 mg/dL (H)). Recent Labs    09/06/20 1628  LIPASE 20   No results for input(s): CKTOTAL, CKMB, CKMBINDEX, TROPONINI in the last 72 hours. Invalid input(s): POCBNP No results for input(s): DDIMER in the last 72 hours. No results for input(s): HGBA1C in the last 72 hours. No results for input(s): CHOL, HDL, LDLCALC, TRIG, CHOLHDL, LDLDIRECT in the last 72 hours. No results for input(s): TSH, T4TOTAL, T3FREE, THYROIDAB in the last 72 hours.  Invalid input(s): FREET3 No results for  input(s): VITAMINB12, FOLATE, FERRITIN, TIBC, IRON, RETICCTPCT in the last 72 hours. Coags: No results for input(s): INR in the last 72 hours.  Invalid input(s): PT Microbiology: Recent Results (from the past 240 hour(s))  SARS CORONAVIRUS 2 (TAT 6-24  HRS) Nasopharyngeal Nasopharyngeal Swab     Status: None   Collection Time: 09/07/20 11:22 AM   Specimen: Nasopharyngeal Swab  Result Value Ref Range Status   SARS Coronavirus 2 NEGATIVE NEGATIVE Final    Comment: (NOTE) SARS-CoV-2 target nucleic acids are NOT DETECTED.  The SARS-CoV-2 RNA is generally detectable in upper and lower respiratory specimens during the acute phase of infection. Negative results do not preclude SARS-CoV-2 infection, do not rule out co-infections with other pathogens, and should not be used as the sole basis for treatment or other patient management decisions. Negative results must be combined with clinical observations, patient history, and epidemiological information. The expected result is Negative.  Fact Sheet for Patients: SugarRoll.be  Fact Sheet for Healthcare Providers: https://www.woods-mathews.com/  This test is not yet approved or cleared by the [place] FDA and  has been authorized for detection and/or diagnosis of SARS-CoV-2 by FDA under an Emergency Use Authorization (EUA). This EUA will remain  in effect (meaning this test can be used) for the duration of the COVID-19 declaration under Se ction 564(b)(1) of the Act, 21 U.S.C. section 360bbb-3(b)(1), unless the authorization is terminated or revoked sooner.  Performed at [facility], [place] [address] [place], [place] [address]     FURTHER DISCHARGE INSTRUCTIONS:  Get Medicines reviewed and adjusted: Please take all your medications with you for your next visit with your Primary MD  Laboratory/radiological data: Please request your Primary MD to go over all hospital tests and  procedure/radiological results at the follow up, please ask your Primary MD to get all Hospital records sent to his/her office.  In some cases, they will be blood work, cultures and biopsy results pending at the time of your discharge. Please request that your primary care M.D. goes through all the records of your hospital data and follows up on these results.  Also Note the following: If you experience worsening of your admission symptoms, develop shortness of breath, life threatening emergency, suicidal or homicidal thoughts you must seek medical attention immediately by calling 911 or calling your MD immediately  if symptoms less severe.  You must read complete instructions/literature along with all the possible adverse reactions/side effects for all the Medicines you take and that have been prescribed to you. Take any new Medicines after you have completely understood and accpet all the possible adverse reactions/side effects.   Do not drive when taking Pain medications or sleeping medications (Benzodaizepines)  Do not take more than prescribed Pain, Sleep and Anxiety Medications. It is not advisable to combine anxiety,sleep and pain medications without talking with your primary care practitioner  Special Instructions: If you have smoked or chewed Tobacco  in the last 2 yrs please stop smoking, stop any regular Alcohol  and or any Recreational drug use.  Wear Seat belts while driving.  Please note: You were cared for by a hospitalist during your hospital stay. Once you are discharged, your primary care physician will handle any further medical issues. Please note that NO REFILLS for any discharge medications will be authorized once you are discharged, as it is imperative that you return to your primary care physician (or establish a relationship with a primary care physician if you do not have one) for your post hospital discharge needs so that they can reassess your need for medications and  monitor your lab values.  Total Time spent coordinating discharge including counseling, education and face to face time equals 35 minutes.  Signed: Oren Binet 09/08/2020  11:44 AM

## 2021-02-22 ENCOUNTER — Inpatient Hospital Stay (HOSPITAL_COMMUNITY)
Admission: EM | Admit: 2021-02-22 | Discharge: 2021-03-01 | DRG: 374 | Disposition: A | Discharge status: Hospice | Payer: Medicare Other | Attending: Internal Medicine | Admitting: Internal Medicine

## 2021-02-22 ENCOUNTER — Other Ambulatory Visit: Payer: Self-pay

## 2021-02-22 ENCOUNTER — Encounter (HOSPITAL_COMMUNITY): Payer: Self-pay | Admitting: Emergency Medicine

## 2021-02-22 DIAGNOSIS — Z5320 Procedure and treatment not carried out because of patient's decision for unspecified reasons: Secondary | ICD-10-CM | POA: Diagnosis not present

## 2021-02-22 DIAGNOSIS — R54 Age-related physical debility: Secondary | ICD-10-CM | POA: Diagnosis present

## 2021-02-22 DIAGNOSIS — K5900 Constipation, unspecified: Secondary | ICD-10-CM | POA: Diagnosis present

## 2021-02-22 DIAGNOSIS — R531 Weakness: Secondary | ICD-10-CM | POA: Diagnosis not present

## 2021-02-22 DIAGNOSIS — Z800 Family history of malignant neoplasm of digestive organs: Secondary | ICD-10-CM

## 2021-02-22 DIAGNOSIS — Z90711 Acquired absence of uterus with remaining cervical stump: Secondary | ICD-10-CM

## 2021-02-22 DIAGNOSIS — Z823 Family history of stroke: Secondary | ICD-10-CM

## 2021-02-22 DIAGNOSIS — E43 Unspecified severe protein-calorie malnutrition: Secondary | ICD-10-CM | POA: Diagnosis present

## 2021-02-22 DIAGNOSIS — Z20822 Contact with and (suspected) exposure to covid-19: Secondary | ICD-10-CM | POA: Diagnosis present

## 2021-02-22 DIAGNOSIS — K922 Gastrointestinal hemorrhage, unspecified: Secondary | ICD-10-CM | POA: Diagnosis not present

## 2021-02-22 DIAGNOSIS — M109 Gout, unspecified: Secondary | ICD-10-CM | POA: Diagnosis present

## 2021-02-22 DIAGNOSIS — Z6821 Body mass index (BMI) 21.0-21.9, adult: Secondary | ICD-10-CM

## 2021-02-22 DIAGNOSIS — I5032 Chronic diastolic (congestive) heart failure: Secondary | ICD-10-CM | POA: Diagnosis not present

## 2021-02-22 DIAGNOSIS — Z806 Family history of leukemia: Secondary | ICD-10-CM

## 2021-02-22 DIAGNOSIS — N1832 Chronic kidney disease, stage 3b: Secondary | ICD-10-CM | POA: Diagnosis present

## 2021-02-22 DIAGNOSIS — F32A Depression, unspecified: Secondary | ICD-10-CM | POA: Diagnosis present

## 2021-02-22 DIAGNOSIS — K219 Gastro-esophageal reflux disease without esophagitis: Secondary | ICD-10-CM | POA: Diagnosis present

## 2021-02-22 DIAGNOSIS — K529 Noninfective gastroenteritis and colitis, unspecified: Secondary | ICD-10-CM | POA: Diagnosis present

## 2021-02-22 DIAGNOSIS — E860 Dehydration: Secondary | ICD-10-CM | POA: Diagnosis present

## 2021-02-22 DIAGNOSIS — D62 Acute posthemorrhagic anemia: Secondary | ICD-10-CM | POA: Diagnosis present

## 2021-02-22 DIAGNOSIS — E876 Hypokalemia: Secondary | ICD-10-CM | POA: Diagnosis present

## 2021-02-22 DIAGNOSIS — C49A5 Gastrointestinal stromal tumor of rectum: Principal | ICD-10-CM | POA: Diagnosis present

## 2021-02-22 DIAGNOSIS — B961 Klebsiella pneumoniae [K. pneumoniae] as the cause of diseases classified elsewhere: Secondary | ICD-10-CM | POA: Diagnosis not present

## 2021-02-22 DIAGNOSIS — E872 Acidosis, unspecified: Secondary | ICD-10-CM | POA: Diagnosis present

## 2021-02-22 DIAGNOSIS — R197 Diarrhea, unspecified: Secondary | ICD-10-CM | POA: Diagnosis not present

## 2021-02-22 DIAGNOSIS — D72829 Elevated white blood cell count, unspecified: Secondary | ICD-10-CM | POA: Diagnosis present

## 2021-02-22 DIAGNOSIS — R627 Adult failure to thrive: Secondary | ICD-10-CM | POA: Diagnosis present

## 2021-02-22 DIAGNOSIS — Z8673 Personal history of transient ischemic attack (TIA), and cerebral infarction without residual deficits: Secondary | ICD-10-CM

## 2021-02-22 DIAGNOSIS — Z8249 Family history of ischemic heart disease and other diseases of the circulatory system: Secondary | ICD-10-CM

## 2021-02-22 DIAGNOSIS — I130 Hypertensive heart and chronic kidney disease with heart failure and stage 1 through stage 4 chronic kidney disease, or unspecified chronic kidney disease: Secondary | ICD-10-CM | POA: Diagnosis present

## 2021-02-22 DIAGNOSIS — C49A0 Gastrointestinal stromal tumor, unspecified site: Secondary | ICD-10-CM | POA: Diagnosis present

## 2021-02-22 DIAGNOSIS — E8809 Other disorders of plasma-protein metabolism, not elsewhere classified: Secondary | ICD-10-CM | POA: Diagnosis present

## 2021-02-22 DIAGNOSIS — D72828 Other elevated white blood cell count: Secondary | ICD-10-CM | POA: Diagnosis present

## 2021-02-22 DIAGNOSIS — Z79899 Other long term (current) drug therapy: Secondary | ICD-10-CM

## 2021-02-22 DIAGNOSIS — R64 Cachexia: Secondary | ICD-10-CM | POA: Diagnosis present

## 2021-02-22 DIAGNOSIS — N179 Acute kidney failure, unspecified: Secondary | ICD-10-CM | POA: Diagnosis present

## 2021-02-22 DIAGNOSIS — I82621 Acute embolism and thrombosis of deep veins of right upper extremity: Secondary | ICD-10-CM | POA: Diagnosis not present

## 2021-02-22 DIAGNOSIS — I10 Essential (primary) hypertension: Secondary | ICD-10-CM | POA: Diagnosis present

## 2021-02-22 DIAGNOSIS — N184 Chronic kidney disease, stage 4 (severe): Secondary | ICD-10-CM | POA: Diagnosis not present

## 2021-02-22 DIAGNOSIS — Z7982 Long term (current) use of aspirin: Secondary | ICD-10-CM

## 2021-02-22 DIAGNOSIS — Z833 Family history of diabetes mellitus: Secondary | ICD-10-CM

## 2021-02-22 DIAGNOSIS — K521 Toxic gastroenteritis and colitis: Secondary | ICD-10-CM | POA: Diagnosis present

## 2021-02-22 DIAGNOSIS — T474X5A Adverse effect of other laxatives, initial encounter: Secondary | ICD-10-CM | POA: Diagnosis present

## 2021-02-22 DIAGNOSIS — N390 Urinary tract infection, site not specified: Secondary | ICD-10-CM | POA: Diagnosis not present

## 2021-02-22 DIAGNOSIS — Z66 Do not resuscitate: Secondary | ICD-10-CM | POA: Diagnosis present

## 2021-02-22 DIAGNOSIS — D649 Anemia, unspecified: Secondary | ICD-10-CM

## 2021-02-22 LAB — RESP PANEL BY RT-PCR (FLU A&B, COVID) ARPGX2
Influenza A by PCR: NEGATIVE
Influenza B by PCR: NEGATIVE
SARS Coronavirus 2 by RT PCR: NEGATIVE

## 2021-02-22 LAB — COMPREHENSIVE METABOLIC PANEL
ALT: 25 U/L (ref 0–44)
AST: 23 U/L (ref 15–41)
Albumin: <1.5 g/dL — ABNORMAL LOW (ref 3.5–5.0)
Alkaline Phosphatase: 75 U/L (ref 38–126)
Anion gap: 5 (ref 5–15)
BUN: 33 mg/dL — ABNORMAL HIGH (ref 8–23)
CO2: 16 mmol/L — ABNORMAL LOW (ref 22–32)
Calcium: 6.8 mg/dL — ABNORMAL LOW (ref 8.9–10.3)
Chloride: 115 mmol/L — ABNORMAL HIGH (ref 98–111)
Creatinine, Ser: 2.17 mg/dL — ABNORMAL HIGH (ref 0.44–1.00)
GFR, Estimated: 23 mL/min — ABNORMAL LOW (ref >60)
Glucose, Bld: 98 mg/dL (ref 70–99)
Potassium: 3.1 mmol/L — ABNORMAL LOW (ref 3.5–5.1)
Sodium: 136 mmol/L (ref 135–145)
Total Bilirubin: 0.9 mg/dL (ref 0.3–1.2)
Total Protein: 4.4 g/dL — ABNORMAL LOW (ref 6.5–8.1)

## 2021-02-22 LAB — CBC WITH DIFFERENTIAL/PLATELET
Abs Immature Granulocytes: 0.1 10*3/uL — ABNORMAL HIGH (ref 0.00–0.07)
Basophils Absolute: 0 10*3/uL (ref 0.0–0.1)
Basophils Relative: 0 %
Eosinophils Absolute: 0.1 10*3/uL (ref 0.0–0.5)
Eosinophils Relative: 1 %
HCT: 15 % — ABNORMAL LOW (ref 36.0–46.0)
Hemoglobin: 3.7 g/dL — CL (ref 12.0–15.0)
Immature Granulocytes: 1 %
Lymphocytes Relative: 9 %
Lymphs Abs: 1.1 10*3/uL (ref 0.7–4.0)
MCH: 12.6 pg — ABNORMAL LOW (ref 26.0–34.0)
MCHC: 24.7 g/dL — ABNORMAL LOW (ref 30.0–36.0)
MCV: 51.2 fL — ABNORMAL LOW (ref 80.0–100.0)
Monocytes Absolute: 2.2 10*3/uL — ABNORMAL HIGH (ref 0.1–1.0)
Monocytes Relative: 17 %
Neutro Abs: 9.8 10*3/uL — ABNORMAL HIGH (ref 1.7–7.7)
Neutrophils Relative %: 72 %
Platelets: 338 10*3/uL (ref 150–400)
RBC: 2.93 MIL/uL — ABNORMAL LOW (ref 3.87–5.11)
RDW: 30.1 % — ABNORMAL HIGH (ref 11.5–15.5)
WBC: 13.4 10*3/uL — ABNORMAL HIGH (ref 4.0–10.5)
nRBC: 0 % (ref 0.0–0.2)

## 2021-02-22 LAB — LACTIC ACID, PLASMA
Lactic Acid, Venous: 1.6 mmol/L (ref 0.5–1.9)
Lactic Acid, Venous: 1.8 mmol/L (ref 0.5–1.9)

## 2021-02-22 LAB — TROPONIN I (HIGH SENSITIVITY)
Troponin I (High Sensitivity): 10 ng/L (ref <18)
Troponin I (High Sensitivity): 10 ng/L (ref <18)

## 2021-02-22 LAB — PREPARE RBC (CROSSMATCH)

## 2021-02-22 MED ORDER — ACETAMINOPHEN 650 MG RE SUPP: 650.0000 mg | Freq: Four times a day (QID) | RECTAL | Status: DC | PRN

## 2021-02-22 MED ORDER — POLYETHYLENE GLYCOL 3350 17 G PO PACK: 17.0000 g | PACK | Freq: Every day | ORAL | Status: DC | PRN

## 2021-02-22 MED ORDER — ACETAMINOPHEN 325 MG PO TABS
650.0000 mg | ORAL_TABLET | Freq: Four times a day (QID) | ORAL | Status: DC | PRN
  Administered 2021-02-24 – 2021-02-28 (×2): 650 mg via ORAL
  Filled 2021-02-22 (×2): qty 2

## 2021-02-22 MED ORDER — SODIUM CHLORIDE 0.9% IV SOLUTION: Freq: Once | INTRAVENOUS | Status: DC

## 2021-02-22 MED ORDER — ONDANSETRON HCL 4 MG/2ML IJ SOLN: 4.0000 mg | Freq: Four times a day (QID) | INTRAMUSCULAR | Status: DC | PRN

## 2021-02-22 MED ORDER — POTASSIUM CHLORIDE CRYS ER 20 MEQ PO TBCR
40.0000 meq | EXTENDED_RELEASE_TABLET | Freq: Once | ORAL | Status: AC
  Administered 2021-02-22: 22:00:00 40 meq via ORAL
  Filled 2021-02-22: qty 2

## 2021-02-22 MED ORDER — ONDANSETRON HCL 4 MG PO TABS: 4.0000 mg | ORAL_TABLET | Freq: Four times a day (QID) | ORAL | Status: DC | PRN

## 2021-02-22 MED ORDER — HYDRALAZINE HCL 20 MG/ML IJ SOLN: 10.0000 mg | Freq: Four times a day (QID) | INTRAMUSCULAR | Status: DC | PRN

## 2021-02-22 MED ORDER — LACTATED RINGERS IV BOLUS
1000.0000 mL | Freq: Once | INTRAVENOUS | Status: AC
  Administered 2021-02-22: 19:00:00 1000 mL via INTRAVENOUS

## 2021-02-22 NOTE — H&P (Addendum)
History and Physical    Shannon Terry UUE:[id number] DOB: 01-24-1941 DOA: 02/22/2021  PCP: Patient, No Pcp Per (Inactive)  Patient coming from: Home via EMS   Chief Complaint:  Chief Complaint  Patient presents with   Weakness   Diarrhea     HPI:    80 year old female with past medical history of rectal stromal tumor (Dx 2014, historially followed by Dr. Marin Olp but not currently), frequent hospitalizations for severe blood loss anemia due to lower gastrointestinal bleeding, previous CVA, diastolic congestive heart failure (Echo 05/2020 EF 55% with G1DD), hypertension chronic kidney disease stage IIIb who presents to [facility] emergency department with complaints of diarrhea and generalized weakness via EMS.   Patient explains that over the past several weeks she has been experiencing intermittent diarrhea. Patient explains that the diarrhea is typically brown but can be "red, green or black" patient is unable to quantify how many times a day she has been experiencing diarrhea.  Patient states that her symptoms have persisted and have become associated with generalized weakness and poor appetite with symptoms that have progressively worsened over the past 2 to 3 weeks.  Patient denies recent antibiotic use.  Patiently complains of lightheadedness upon arising from a seated position.  Patient denies associated chest pain or shortness of breath.  Due to patient's progressively worsening symptoms EMS was contacted who promptly came to evaluate the patient and brought her into [facility] emergency department for evaluation.  Upon evaluation in the emergency department CBC revealed a hemoglobin of 3.7.  Patient is additionally found to be hypokalemic with potassium 3.1 with evidence of metabolic acidosis and normal lactic acid.  ER provider ordered 3 units of packed red blood cells and the hospitalist group was then called to assess the patient for admission to the  hospital.    Review of Systems:   Review of Systems  Constitutional:  Positive for malaise/fatigue and weight loss.  Gastrointestinal:  Positive for diarrhea.  Neurological:  Positive for dizziness and weakness.  All other systems reviewed and are negative.  Past Medical History:  Diagnosis Date   Acute renal failure (HCC)    Anasarca    Anemia, iron deficiency    chronic blood loss   Gastritis without bleeding    GERD (gastroesophageal reflux disease)    GI bleeding    hx of   Hematochezia    Prior to Diagniosis   Hemorrhoids    History of blood transfusion 04/01/12   Hgb. 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    Rectal cancer ([place]) 04/03/12   gastrointestinal stromal tumor    Past Surgical History:  Procedure Laterality Date   ABDOMINAL HYSTERECTOMY     Partial   BIOPSY STOMACH  04/03/12   neg for malignancy   COLONOSCOPY  04/03/2012   Procedure: COLONOSCOPY;  Surgeon: Jerene Bears, MD;  Location: [place];  Service: Gastroenterology;  Laterality: N/A;   ESOPHAGOGASTRODUODENOSCOPY  04/03/2012   Procedure: ESOPHAGOGASTRODUODENOSCOPY (EGD);  Surgeon: Jerene Bears, MD;  Location: [place];  Service: Gastroenterology;  Laterality: N/A;   EUS  04/08/2012   Procedure: LOWER ENDOSCOPIC ULTRASOUND (EUS);  Surgeon: Milus Banister, MD;  Location: [facility];  Service: Endoscopy;  Laterality: N/A;   RECTAL BIOPSY  04/03/12   gastrointestinal stromal tumor     reports that she has  never smoked. She has never used smokeless tobacco. She reports that she does not drink alcohol and does not use drugs.  No Known Allergies  Family History  Problem Relation Age of Onset   Diabetes Mellitus II Father        died from stroke   Leukemia Brother    Pancreatic cancer Sister    Heart attack Brother      Prior to Admission medications   Medication Sig Start Date End  Date Taking? Authorizing Provider  aspirin EC 81 MG tablet Take 81 mg by mouth every 6 (six) hours as needed for moderate pain. Swallow whole.   Yes [provider]  acetaminophen (TYLENOL) 325 MG tablet Take 2 tablets (650 mg total) by mouth every 6 (six) hours as needed for mild pain (or Fever >/= 101). Patient not taking: Reported on 02/22/2021 09/08/20   Jonetta Osgood, MD  ferrous sulfate 325 (65 FE) MG EC tablet Take 1 tablet (325 mg total) by mouth 2 (two) times daily. Patient not taking: Reported on 02/22/2021 09/08/20 09/08/21  Jonetta Osgood, MD  pantoprazole (PROTONIX) 40 MG tablet Take 1 tablet (40 mg total) by mouth daily. Patient not taking: Reported on 02/22/2021 09/08/20 09/08/21  Jonetta Osgood, MD    Physical Exam: Vitals:   02/22/21 2336 02/22/21 2355 02/23/21 0000 02/23/21 0015  BP: 138/83 113/83 124/78 131/76  Pulse: 82 85  85  Resp: 17 16 16 16   Temp: 97.7 F (36.5 C) 97.8 F (36.6 C)    TempSrc: Oral Oral    SpO2: 100%   98%    Constitutional: Lethargic but arousable, oriented x3, no associated distress.   Skin: no rashes, no lesions, good skin turgor noted. Eyes: Pupils are equally reactive to light.  No evidence of scleral icterus or conjunctival pallor.  ENMT: Moist mucous membranes noted.  Posterior pharynx clear of any exudate or lesions.   Neck: normal, supple, no masses, no thyromegaly.  No evidence of jugular venous distension.   Respiratory: clear to auscultation bilaterally, no wheezing, no crackles. Normal respiratory effort. No accessory muscle use.  Cardiovascular: Regular rate and rhythm, no murmurs / rubs / gallops. No extremity edema. 2+ pedal pulses. No carotid bruits.  Chest:   Nontender without crepitus or deformity.   Back:   Nontender without crepitus or deformity. Abdomen: Abdomen is soft and nontender.  No evidence of intra-abdominal masses.  Positive bowel sounds noted in all quadrants.   Rectal: Post visualization of the  rectum reveals no obvious masses with watery brown stool output mixed with small amounts of bright red blood. Musculoskeletal: No joint deformity upper and lower extremities. Good ROM, no contractures. Normal muscle tone.  Neurologic: CN 2-12 grossly intact. Sensation intact.  Patient moving all 4 extremities spontaneously.  Patient is following all commands.  Patient is responsive to verbal stimuli.   Psychiatric: Patient exhibits normal mood with appropriate affect.  Patient seems to possess insight as to their current situation.     Labs on Admission: I have personally reviewed following labs and imaging studies -   CBC: Recent Labs  Lab 02/22/21 1909  WBC 13.4*  NEUTROABS 9.8*  HGB 3.7*  HCT 15.0*  MCV 51.2*  PLT 500   Basic Metabolic Panel: Recent Labs  Lab 02/22/21 1909  NA 136  K 3.1*  CL 115*  CO2 16*  GLUCOSE 98  BUN 33*  CREATININE 2.17*  CALCIUM 6.8*   GFR: CrCl cannot be calculated (Unknown  ideal weight.). Liver Function Tests: Recent Labs  Lab 02/22/21 1909  AST 23  ALT 25  ALKPHOS 75  BILITOT 0.9  PROT 4.4*  ALBUMIN <1.5*   No results for input(s): LIPASE, AMYLASE in the last 168 hours. No results for input(s): AMMONIA in the last 168 hours. Coagulation Profile: No results for input(s): INR, PROTIME in the last 168 hours. Cardiac Enzymes: No results for input(s): CKTOTAL, CKMB, CKMBINDEX, TROPONINI in the last 168 hours. BNP (last 3 results) No results for input(s): PROBNP in the last 8760 hours. HbA1C: No results for input(s): HGBA1C in the last 72 hours. CBG: No results for input(s): GLUCAP in the last 168 hours. Lipid Profile: No results for input(s): CHOL, HDL, LDLCALC, TRIG, CHOLHDL, LDLDIRECT in the last 72 hours. Thyroid Function Tests: No results for input(s): TSH, T4TOTAL, FREET4, T3FREE, THYROIDAB in the last 72 hours. Anemia Panel: No results for input(s): VITAMINB12, FOLATE, FERRITIN, TIBC, IRON, RETICCTPCT in the last 72  hours. Urine analysis:    Component Value Date/Time   COLORURINE YELLOW 11/10/2018 1250   APPEARANCEUR CLEAR 11/10/2018 1250   LABSPEC 1.013 11/10/2018 1250   PHURINE 5.0 11/10/2018 1250   GLUCOSEU NEGATIVE 11/10/2018 1250   HGBUR NEGATIVE 11/10/2018 1250   BILIRUBINUR NEGATIVE 11/10/2018 1250   KETONESUR NEGATIVE 11/10/2018 1250   PROTEINUR NEGATIVE 11/10/2018 1250   UROBILINOGEN 1.0 04/01/2012 0100   NITRITE NEGATIVE 11/10/2018 1250   LEUKOCYTESUR NEGATIVE 11/10/2018 1250    Radiological Exams on Admission - Personally Reviewed: No results found.  EKG: Personally reviewed.  Rhythm is NSR with frequent PAC's with heart rate of 93BPM.  No dynamic ST segment changes appreciated.  Assessment/Plan  * Acute lower GI bleeding Patient experiencing 4 weeks of watery diarrhea mixed with what seems to be bright red blood that I have personally visualized here in the emergency department As a result, patient is presenting with profound anemia with hemoglobin 3.7 Patient has a history of previous hospitalizations first for severe anemia related to rectal bleeding the past due to an untreated rectal stromal tumor 3 units of packed red blood cells have been ordered by the emergency department staff Will monitor patient closely throughout transfusion to ensure patient does not exhibit any evidence of volume overload I do not believe that GI consultation is indicated this patient has been symptomatically treated for her bouts of anemia ever since she stopped seeking active treatment for her malignancy in 2016 Obtaining serial CBCs posttransfusion to ensure stability of blood count Will obtain iron panel, folate, vitamin B12  Chronic diarrhea Unlikely to be infectious Daughter reports the patient regularly takes MiraLAX to the point of having chronic loose stools to avoid getting constipated due to her stromal rectal tumor  No further work-up necessary Will restart maintenance bowel regimen  once frequent diarrhea slows somewhat  Hypokalemia due to excessive gastrointestinal loss of potassium Replacing with potassium chloride Evaluating for concurrent hypomagnesemia  Monitoring potassium levels with serial chemistries.   Leukocytosis Likely chronic with no clinical evidence of infectious process   CKD (chronic kidney disease), stage IV (HCC) Strict intake and output monitoring Creatinine seems to be slowly trending upward with patient now in stage IV chronic kidney disease Avoiding nephrotoxic agents as much as possible Serial chemistries to monitor renal function and electrolytes   Metabolic acidosis Notable moderate metabolic acidosis based on chemistry Lactic acid unremarkable Likely secondary to chronic kidney disease If metabolic acidosis worsens will consider initiation of oral bicarbonate supplementation  Essential hypertension Historically patient  has a history of hypertension but is currently exhibiting low normal blood pressures and is currently not on antihypertensive therapy Will initiate antihypertensives if necessary as we provide patient with blood transfusion but it does not appear that this is necessary for now  Chronic diastolic CHF (congestive heart failure) (HCC) No clinical evidence of cardiogenic volume overload      Code Status:  DNR  code status decision has been confirmed with: patient Family Communication: deferred   Patient Status Observation: Patient is expected to remain hospitalized for less than 2 midnights    Vernelle Emerald MD [facility] Pager [phone]  If 7PM-7AM, please contact night-coverage [email or web address] Use universal [place] password for that web site. If you do not have the password, please call the hospital operator.  02/23/2021, 12:54 AM

## 2021-02-22 NOTE — Assessment & Plan Note (Signed)
·   Notable moderate metabolic acidosis based on chemistry  Lactic acid unremarkable  Likely secondary to chronic kidney disease  If metabolic acidosis worsens will consider initiation of oral bicarbonate supplementation

## 2021-02-22 NOTE — ED Triage Notes (Signed)
Patient BIB [facility] from home. Compliant of diarrhea and increasing weakness over the last few weeks. Pt with dx of colorectal cancer that is not being treated.  91/50 166 cbg 100 ra 17 rr 22 co2

## 2021-02-22 NOTE — Assessment & Plan Note (Addendum)
·   Patient experiencing 4 weeks of watery diarrhea mixed with what seems to be bright red blood that I have personally visualized here in the emergency department  As a result, patient is presenting with profound anemia with hemoglobin 3.7  Patient has a history of previous hospitalizations first for severe anemia related to rectal bleeding the past due to an untreated rectal stromal tumor  3 units of packed red blood cells have been ordered by the emergency department staff  Will monitor patient closely throughout transfusion to ensure patient does not exhibit any evidence of volume overload  I do not believe that GI consultation is indicated this patient has been symptomatically treated for her bouts of anemia ever since she stopped seeking active treatment for her malignancy in 2016  Obtaining serial CBCs posttransfusion to ensure stability of blood count  Will obtain iron panel, folate, vitamin B12

## 2021-02-22 NOTE — Assessment & Plan Note (Addendum)
·   Unlikely to be infectious  Daughter reports the patient regularly takes MiraLAX to the point of having chronic loose stools to avoid getting constipated due to her stromal rectal tumor   No further work-up necessary  Will restart maintenance bowel regimen once frequent diarrhea slows somewhat

## 2021-02-22 NOTE — Assessment & Plan Note (Signed)
·   Replacing with potassium chloride °· Evaluating for concurrent hypomagnesemia  °· Monitoring potassium levels with serial chemistries. ° °

## 2021-02-22 NOTE — Assessment & Plan Note (Signed)
·   Historically patient has a history of hypertension but is currently exhibiting low normal blood pressures and is currently not on antihypertensive therapy  Will initiate antihypertensives if necessary as we provide patient with blood transfusion but it does not appear that this is necessary for now

## 2021-02-22 NOTE — Assessment & Plan Note (Signed)
•   No clinical evidence of cardiogenic volume overload ° °

## 2021-02-22 NOTE — ED Provider Notes (Signed)
[facility] EMERGENCY DEPARTMENT Provider Note   CSN: [id number] Arrival date & time: 02/22/21  1848     History Chief Complaint  Patient presents with   Weakness   Diarrhea    Shannon Terry is a 80 y.o. female with PMH malignant GIST with rectal mass, history of previous CVA, CHF, HTN, multiple admissions for recurrent GI blood loss who presents emergency department for evaluation of fatigue and diarrhea.  Patient states that over the last 2 weeks she has felt increasingly weak.  She denies vomiting, chest pain, abdominal pain, fever, cough or other systemic symptoms.  She states that over the last 24 hours her stools have become more black.   Weakness Associated symptoms: diarrhea   Associated symptoms: no abdominal pain, no arthralgias, no chest pain, no cough, no dysuria, no fever, no seizures, no shortness of breath and no vomiting   Diarrhea Associated symptoms: no abdominal pain, no arthralgias, no chills, no fever and no vomiting       Past Medical History:  Diagnosis Date   Acute renal failure (HCC)    Anasarca    Anemia, iron deficiency    chronic blood loss   Gastritis without bleeding    GERD (gastroesophageal reflux disease)    GI bleeding    hx of   Hematochezia    Prior to Diagniosis   Hemorrhoids    History of blood transfusion 04/01/12   Hgb. 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    Rectal cancer ([place]) 04/03/12   gastrointestinal stromal tumor    Patient Active Problem List   Diagnosis Date Noted   Symptomatic anemia 09/06/2020   Chronic kidney disease, stage 3b ([place]) 09/06/2020   Acute blood loss anemia 05/03/2020   AKI (acute kidney injury) ([place]) 05/03/2020   Malignant cachexia ([place]) 05/03/2020   Hemiplegia and hemiparesis following cerebral infarction affecting left non-dominant side ([place]) 05/03/2020   Chronic diastolic CHF  (congestive heart failure) ([place]) 05/03/2020   GI bleed 05/11/2019   GIB (gastrointestinal bleeding) 11/10/2018   Left leg weakness 11/15/2015   History of CVA (cerebrovascular accident) 11/15/2015   Rectal carcinoma ([place])    Acute CVA (cerebrovascular accident) ([place]) 04/08/2015   Hypertension 04/07/2015   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   GERD (gastroesophageal reflux disease)    GI bleeding    Iron deficiency anemia due to chronic blood loss 04/01/2012    Past Surgical History:  Procedure Laterality Date   ABDOMINAL HYSTERECTOMY     Partial   BIOPSY STOMACH  04/03/12   neg for malignancy   COLONOSCOPY  04/03/2012   Procedure: COLONOSCOPY;  Surgeon: Jerene Bears, MD;  Location: [place];  Service: Gastroenterology;  Laterality: N/A;   ESOPHAGOGASTRODUODENOSCOPY  04/03/2012   Procedure: ESOPHAGOGASTRODUODENOSCOPY (EGD);  Surgeon: Jerene Bears, MD;  Location: [place];  Service: Gastroenterology;  Laterality: N/A;   EUS  04/08/2012   Procedure: LOWER ENDOSCOPIC ULTRASOUND (EUS);  Surgeon: Milus Banister, MD;  Location: [facility];  Service: Endoscopy;  Laterality: N/A;   RECTAL BIOPSY  04/03/12   gastrointestinal stromal tumor     OB History   No obstetric history on file.    Obstetric Comments  No HRT         Family History  Problem Relation Age of Onset   Diabetes Mellitus II Father  died from stroke   Leukemia Brother    Pancreatic cancer Sister    Heart attack Brother     Social History   Tobacco Use   Smoking status: Never   Smokeless tobacco: Never   Tobacco comments:    never used tobacco  Substance Use Topics   Alcohol use: No   Drug use: No    Home Medications Prior to Admission medications   Medication Sig Start Date End Date Taking? Authorizing Provider  aspirin EC 81 MG tablet Take 81 mg by mouth every 6 (six) hours as needed for moderate pain. Swallow whole.   Yes [provider]  acetaminophen (TYLENOL) 325 MG tablet  Take 2 tablets (650 mg total) by mouth every 6 (six) hours as needed for mild pain (or Fever >/= 101). Patient not taking: Reported on 02/22/2021 09/08/20   Jonetta Osgood, MD  ferrous sulfate 325 (65 FE) MG EC tablet Take 1 tablet (325 mg total) by mouth 2 (two) times daily. Patient not taking: Reported on 02/22/2021 09/08/20 09/08/21  Jonetta Osgood, MD  pantoprazole (PROTONIX) 40 MG tablet Take 1 tablet (40 mg total) by mouth daily. Patient not taking: Reported on 02/22/2021 09/08/20 09/08/21  Jonetta Osgood, MD    Allergies    Patient has no known allergies.  Review of Systems   Review of Systems  Constitutional:  Negative for chills and fever.  HENT:  Negative for ear pain and sore throat.   Eyes:  Negative for pain and visual disturbance.  Respiratory:  Negative for cough and shortness of breath.   Cardiovascular:  Negative for chest pain and palpitations.  Gastrointestinal:  Positive for diarrhea. Negative for abdominal pain and vomiting.  Genitourinary:  Negative for dysuria and hematuria.  Musculoskeletal:  Negative for arthralgias and back pain.  Skin:  Negative for color change and rash.  Neurological:  Positive for weakness. Negative for seizures and syncope.  All other systems reviewed and are negative.  Physical Exam Updated Vital Signs BP 112/62 (BP Location: Left Arm)    Pulse 99    Temp 97.6 F (36.4 C) (Oral)    Resp 16    SpO2 98%   Physical Exam Vitals and nursing note reviewed.  Constitutional:      General: She is not in acute distress.    Appearance: She is well-developed.  HENT:     Head: Normocephalic and atraumatic.  Eyes:     Conjunctiva/sclera: Conjunctivae normal.  Cardiovascular:     Rate and Rhythm: Normal rate. Rhythm irregular.     Heart sounds: No murmur heard. Pulmonary:     Effort: Pulmonary effort is normal. No respiratory distress.     Breath sounds: Normal breath sounds.  Abdominal:     Palpations: Abdomen is soft.      Tenderness: There is no abdominal tenderness.  Musculoskeletal:        General: No swelling.     Cervical back: Neck supple.  Skin:    General: Skin is warm and dry.     Capillary Refill: Capillary refill takes less than 2 seconds.  Neurological:     Mental Status: She is alert.  Psychiatric:        Mood and Affect: Mood normal.    ED Results / Procedures / Treatments   Labs (all labs ordered are listed, but only abnormal results are displayed) Labs Reviewed  COMPREHENSIVE METABOLIC PANEL - Abnormal; Notable for the following components:  Result Value   Potassium 3.1 (*)    Chloride 115 (*)    CO2 16 (*)    BUN 33 (*)    Creatinine, Ser 2.17 (*)    Calcium 6.8 (*)    Total Protein 4.4 (*)    Albumin <1.5 (*)    GFR, Estimated 23 (*)    All other components within normal limits  CBC WITH DIFFERENTIAL/PLATELET - Abnormal; Notable for the following components:   WBC 13.4 (*)    RBC 2.93 (*)    Hemoglobin 3.7 (*)    HCT 15.0 (*)    MCV 51.2 (*)    MCH 12.6 (*)    MCHC 24.7 (*)    RDW 30.1 (*)    All other components within normal limits  URINE CULTURE  RESP PANEL BY RT-PCR (FLU A&B, COVID) ARPGX2  LACTIC ACID, PLASMA  URINALYSIS, ROUTINE W REFLEX MICROSCOPIC  LACTIC ACID, PLASMA  TYPE AND SCREEN  PREPARE RBC (CROSSMATCH)  TROPONIN I (HIGH SENSITIVITY)    EKG None  Radiology No results found.  Procedures .Critical Care Performed by: Teressa Lower, MD Authorized by: Teressa Lower, MD   Critical care provider statement:    Critical care time (minutes):  30   Critical care was necessary to treat or prevent imminent or life-threatening deterioration of the following conditions:  Metabolic crisis (Critical anemia)   Critical care was time spent personally by me on the following activities:  Development of treatment plan with patient or surrogate, discussions with consultants, evaluation of patient's response to treatment, examination of patient, ordering  and review of laboratory studies, ordering and review of radiographic studies, ordering and performing treatments and interventions, pulse oximetry, re-evaluation of patient's condition and review of old charts   Medications Ordered in ED Medications  0.9 %  sodium chloride infusion (Manually program via Guardrails IV Fluids) (0 mLs Intravenous Hold 02/22/21 2005)  lactated ringers bolus 1,000 mL (1,000 mLs Intravenous New Bag/Given 02/22/21 1929)    ED Course  I have reviewed the triage vital signs and the nursing notes.  Pertinent labs & imaging results that were available during my care of the patient were reviewed by me and considered in my medical decision making (see chart for details).    MDM Rules/Calculators/A&P                          Patient seen emergency department for evaluation of weakness and diarrhea.  Physical exam is largely unremarkable.  Rectal exam deferred in the setting of a rectal mass and this will not change her management as the patient is not currently having active melena here in the emergency department.  Initial laboratory evaluation with a CBC of 13.4 which is downtrending from previous lab evaluation, hemoglobin of 3.7 with an MCV of 51.2, hypokalemia 3.1, CO2 16 but a normal lactate of 1.6, creatinine 2.17 which is at the patient's baseline.  3 units packed red blood cells ordered for the patient and she will require admission for symptomatic anemia.  Patient then admitted.  Final Clinical Impression(s) / ED Diagnoses Final diagnoses:  None    Rx / DC Orders ED Discharge Orders     None        Edynn Gillock, Debe Coder, MD 02/22/21 2034

## 2021-02-22 NOTE — Assessment & Plan Note (Signed)
•   Strict intake and output monitoring  Creatinine seems to be slowly trending upward with patient now in stage IV chronic kidney disease  Avoiding nephrotoxic agents as much as possible  Serial chemistries to monitor renal function and electrolytes

## 2021-02-23 ENCOUNTER — Emergency Department (HOSPITAL_COMMUNITY): Payer: Medicare Other

## 2021-02-23 DIAGNOSIS — N1832 Chronic kidney disease, stage 3b: Secondary | ICD-10-CM | POA: Diagnosis present

## 2021-02-23 DIAGNOSIS — I82621 Acute embolism and thrombosis of deep veins of right upper extremity: Secondary | ICD-10-CM | POA: Diagnosis not present

## 2021-02-23 DIAGNOSIS — C49A5 Gastrointestinal stromal tumor of rectum: Secondary | ICD-10-CM | POA: Diagnosis present

## 2021-02-23 DIAGNOSIS — I5032 Chronic diastolic (congestive) heart failure: Secondary | ICD-10-CM | POA: Diagnosis present

## 2021-02-23 DIAGNOSIS — Z20822 Contact with and (suspected) exposure to covid-19: Secondary | ICD-10-CM | POA: Diagnosis present

## 2021-02-23 DIAGNOSIS — M109 Gout, unspecified: Secondary | ICD-10-CM | POA: Diagnosis present

## 2021-02-23 DIAGNOSIS — F32A Depression, unspecified: Secondary | ICD-10-CM | POA: Diagnosis present

## 2021-02-23 DIAGNOSIS — D62 Acute posthemorrhagic anemia: Secondary | ICD-10-CM | POA: Diagnosis present

## 2021-02-23 DIAGNOSIS — R627 Adult failure to thrive: Secondary | ICD-10-CM | POA: Diagnosis present

## 2021-02-23 DIAGNOSIS — R609 Edema, unspecified: Secondary | ICD-10-CM | POA: Diagnosis not present

## 2021-02-23 DIAGNOSIS — E43 Unspecified severe protein-calorie malnutrition: Secondary | ICD-10-CM | POA: Diagnosis present

## 2021-02-23 DIAGNOSIS — D72829 Elevated white blood cell count, unspecified: Secondary | ICD-10-CM | POA: Diagnosis present

## 2021-02-23 DIAGNOSIS — N390 Urinary tract infection, site not specified: Secondary | ICD-10-CM | POA: Diagnosis not present

## 2021-02-23 DIAGNOSIS — N179 Acute kidney failure, unspecified: Secondary | ICD-10-CM | POA: Diagnosis present

## 2021-02-23 DIAGNOSIS — K219 Gastro-esophageal reflux disease without esophagitis: Secondary | ICD-10-CM | POA: Diagnosis present

## 2021-02-23 DIAGNOSIS — R54 Age-related physical debility: Secondary | ICD-10-CM | POA: Diagnosis present

## 2021-02-23 DIAGNOSIS — Z66 Do not resuscitate: Secondary | ICD-10-CM | POA: Diagnosis present

## 2021-02-23 DIAGNOSIS — Z7189 Other specified counseling: Secondary | ICD-10-CM | POA: Diagnosis not present

## 2021-02-23 DIAGNOSIS — B961 Klebsiella pneumoniae [K. pneumoniae] as the cause of diseases classified elsewhere: Secondary | ICD-10-CM | POA: Diagnosis not present

## 2021-02-23 DIAGNOSIS — E860 Dehydration: Secondary | ICD-10-CM | POA: Diagnosis present

## 2021-02-23 DIAGNOSIS — R64 Cachexia: Secondary | ICD-10-CM | POA: Diagnosis present

## 2021-02-23 DIAGNOSIS — E8809 Other disorders of plasma-protein metabolism, not elsewhere classified: Secondary | ICD-10-CM | POA: Diagnosis present

## 2021-02-23 DIAGNOSIS — E872 Acidosis, unspecified: Secondary | ICD-10-CM | POA: Diagnosis present

## 2021-02-23 DIAGNOSIS — K521 Toxic gastroenteritis and colitis: Secondary | ICD-10-CM | POA: Diagnosis present

## 2021-02-23 DIAGNOSIS — D72828 Other elevated white blood cell count: Secondary | ICD-10-CM | POA: Diagnosis present

## 2021-02-23 DIAGNOSIS — K922 Gastrointestinal hemorrhage, unspecified: Secondary | ICD-10-CM | POA: Diagnosis not present

## 2021-02-23 DIAGNOSIS — R531 Weakness: Secondary | ICD-10-CM | POA: Diagnosis present

## 2021-02-23 DIAGNOSIS — Z515 Encounter for palliative care: Secondary | ICD-10-CM | POA: Diagnosis not present

## 2021-02-23 DIAGNOSIS — I130 Hypertensive heart and chronic kidney disease with heart failure and stage 1 through stage 4 chronic kidney disease, or unspecified chronic kidney disease: Secondary | ICD-10-CM | POA: Diagnosis present

## 2021-02-23 LAB — VITAMIN B12: Vitamin B-12: 2107 pg/mL — ABNORMAL HIGH (ref 180–914)

## 2021-02-23 LAB — CBC
HCT: 36 % (ref 36.0–46.0)
HCT: 37.3 % (ref 36.0–46.0)
Hemoglobin: 11 g/dL — ABNORMAL LOW (ref 12.0–15.0)
Hemoglobin: 11.5 g/dL — ABNORMAL LOW (ref 12.0–15.0)
MCH: 21.6 pg — ABNORMAL LOW (ref 26.0–34.0)
MCH: 21.5 pg — ABNORMAL LOW (ref 26.0–34.0)
MCHC: 30.6 g/dL (ref 30.0–36.0)
MCHC: 30.8 g/dL (ref 30.0–36.0)
MCV: 70.6 fL — ABNORMAL LOW (ref 80.0–100.0)
MCV: 69.7 fL — ABNORMAL LOW (ref 80.0–100.0)
Platelets: 114 10*3/uL — ABNORMAL LOW (ref 150–400)
Platelets: 283 10*3/uL (ref 150–400)
RBC: 5.1 MIL/uL (ref 3.87–5.11)
RBC: 5.35 MIL/uL — ABNORMAL HIGH (ref 3.87–5.11)
WBC: 16.1 10*3/uL — ABNORMAL HIGH (ref 4.0–10.5)
WBC: 16.1 10*3/uL — ABNORMAL HIGH (ref 4.0–10.5)
nRBC: 0 % (ref 0.0–0.2)
nRBC: 0.2 % (ref 0.0–0.2)

## 2021-02-23 LAB — COMPREHENSIVE METABOLIC PANEL
ALT: 28 U/L (ref 0–44)
AST: 21 U/L (ref 15–41)
Albumin: 1.6 g/dL — ABNORMAL LOW (ref 3.5–5.0)
Alkaline Phosphatase: 87 U/L (ref 38–126)
Anion gap: 6 (ref 5–15)
BUN: 32 mg/dL — ABNORMAL HIGH (ref 8–23)
CO2: 17 mmol/L — ABNORMAL LOW (ref 22–32)
Calcium: 7 mg/dL — ABNORMAL LOW (ref 8.9–10.3)
Chloride: 115 mmol/L — ABNORMAL HIGH (ref 98–111)
Creatinine, Ser: 2.21 mg/dL — ABNORMAL HIGH (ref 0.44–1.00)
GFR, Estimated: 22 mL/min — ABNORMAL LOW (ref >60)
Glucose, Bld: 102 mg/dL — ABNORMAL HIGH (ref 70–99)
Potassium: 3.9 mmol/L (ref 3.5–5.1)
Sodium: 138 mmol/L (ref 135–145)
Total Bilirubin: 1.8 mg/dL — ABNORMAL HIGH (ref 0.3–1.2)
Total Protein: 4.6 g/dL — ABNORMAL LOW (ref 6.5–8.1)

## 2021-02-23 LAB — IRON AND TIBC
Iron: 131 ug/dL (ref 28–170)
Saturation Ratios: 76 % — ABNORMAL HIGH (ref 10.4–31.8)
TIBC: 172 ug/dL — ABNORMAL LOW (ref 250–450)
UIBC: 41 ug/dL

## 2021-02-23 LAB — FOLATE: Folate: 10.8 ng/mL (ref >5.9)

## 2021-02-23 LAB — MAGNESIUM: Magnesium: 2 mg/dL (ref 1.7–2.4)

## 2021-02-23 MED ORDER — STERILE WATER FOR INJECTION IV SOLN
INTRAVENOUS | Status: DC
  Filled 2021-02-23: qty 1000
  Filled 2021-02-23: qty 150
  Filled 2021-02-23 (×2): qty 1000

## 2021-02-23 NOTE — ED Notes (Signed)
[place] daughter [phone] requesting an update on the patient

## 2021-02-23 NOTE — Progress Notes (Signed)
PROGRESS NOTE    Shannon Terry  VWP:[id number] DOB: [birth year] DOA: 02/22/2021 PCP: Patient, No Pcp Per (Inactive)   Chief Complain: Weakness, diarrhea  Brief Narrative: Patient is a 80 year old female with history of rectal GIST tumor diagnosed in 2014, not following with oncology /not taking any treatment, frequent hospitalization for severe blood loss anemia from lower GI bleed from the tumor, history of CVA, diastolic congestive heart failure, hypertension, CKD stage IIIb who presented here with complaints of generalized weakness, diarrhea.  She was weak and was unable to ambulate, complained of lightheadedness.  On presentation her hemoglobin was 3.7.  Patient was transfused with 3 units of PRBC, hemoglobin this morning stable in the range of 11.  Patient denies any frank hematochezia or melena.  She does not want any further work-up for her rectal tumor/anemia and does not have plan to follow-up with her oncologist( Dr Marin Olp) . Creatinine found to be elevated from her baseline, patient looks dehydrated, keeping her 1 more night for IV fluids. PT/OT consulted. She may be stable for discharge tomorrow if her kidney function and hemoglobin remained stable  Assessment & Plan:   Principal Problem:   Acute lower GI bleeding Active Problems:   Malignant GIST (HCC)   Essential hypertension   Chronic diastolic CHF (congestive heart failure) (HCC)   Hypokalemia due to excessive gastrointestinal loss of potassium   Metabolic acidosis   CKD (chronic kidney disease), stage IV (HCC)   Chronic diarrhea   Leukocytosis   Acute on chronic blood loss /microcytic anemia from lower GI bleed secondary to rectal GIST tumor: Has been admitted several times for this in the past. Presented with hemoglobin in the range of 3, transfused  3 weeks of PRBC.  Hemoglobin stable in the range of 11 today.  Denies any frank hematochezia or melena.  She might have chronic blood loss from her rectal tumor.  Check CBC  tomorrow.  She needs to continue iron supplementation on discharge  Rectal GIST tumor: Previously following up  with oncology, Dr. Marin Olp.  Diagnosed in 2016.  Was on imatinib in the past.  Does not follow with her oncologist anymore because she does not want to seek the treatment.  She says she just wants to come to the hospital if she becomes sick and she does not want to follow-up with any of the doctors including PCP. Patient is not interested on palliative care discussion.  Diarrhea: Might have stopped.  History of recent diarrhea, takes MiraLAX at home.  No further work-up necessary for now.  Hypokalemia: Supplement with potassium  Leukocytosis: Chronic.  Most likely associated with malignancy.  No evidence of infectious process  CKD stage VZSM-2/LMB-[id number] gap metabolic acidosis: She has progressive CKD.  Her last creatinine was 2.08 as per 09/08/2020.  Kidney function worse than baseline.  Looks mildly dehydrated.  Continue IV fluids for today with bicarb drip.  Check BMP tomorrow.  She needs bicarb tablets on discharge  Chronic diastolic congestive heart failure: Currently euvolemic/dehydrated.  Continue IV fluids for now.  Hypocalcemia: This is most likely from hypoalbuminemia/corrected calcium level is almost normal.  Continue to monitor  Severe protein calorie malnutrition: Albumin of 1.6.  Nutrition consulted.  Secondary to malignancy, poor nutrition          DVT prophylaxis:SCD Code Status: DNR Family Communication: Called both husband and daughter on phone today, calls not received Patient status:Inpatient  Dispo: The patient is from: Home  Anticipated d/c is to: Home              Anticipated d/c date is: tomorrow  Consultants: None  Procedures:None  Antimicrobials:  Anti-infectives (From admission, onward)    None       Subjective:  Patient seen and examined at the bedside this morning.  Hemodynamically stable during my evaluation.  Overall  comfortable.  Denies abdominal pain, nausea vomiting or frank rectal bleeding.  Objective: Vitals:   02/23/21 1030 02/23/21 1045 02/23/21 1130 02/23/21 1230  BP: 121/71 103/69 115/75 108/67  Pulse: 85 87 89 84  Resp: 15 (!) 21 (!) 27 (!) 23  Temp:    97.9 F (36.6 C)  TempSrc:    Oral  SpO2: 100% 100% 100% 100%   No intake or output data in the 24 hours ending 02/23/21 1251 There were no vitals filed for this visit.  Examination:  General exam:not in distress, deconditioned, chronically ill looking HEENT: PERRL Respiratory system:  no wheezes or crackles  Cardiovascular system: S1 & S2 heard, RRR.  Gastrointestinal system: Abdomen is nondistended, soft and nontender. Central nervous system: Alert and oriented Extremities: No edema, no clubbing ,no cyanosis Skin: No rashes, no ulcers,no icterus      Data Reviewed: I have personally reviewed following labs and imaging studies  CBC: Recent Labs  Lab 02/22/21 1909 02/23/21 0500 02/23/21 1050  WBC 13.4* 16.1* 16.1*  NEUTROABS 9.8*  --   --   HGB 3.7* 11.0* 11.5*  HCT 15.0* 36.0 37.3  MCV 51.2* 70.6* 69.7*  PLT 338 114* 865   Basic Metabolic Panel: Recent Labs  Lab 02/22/21 1909 02/23/21 0500  NA 136 138  K 3.1* 3.9  CL 115* 115*  CO2 16* 17*  GLUCOSE 98 102*  BUN 33* 32*  CREATININE 2.17* 2.21*  CALCIUM 6.8* 7.0*  MG  --  2.0   GFR: CrCl cannot be calculated (Unknown ideal weight.). Liver Function Tests: Recent Labs  Lab 02/22/21 1909 02/23/21 0500  AST 23 21  ALT 25 28  ALKPHOS 75 87  BILITOT 0.9 1.8*  PROT 4.4* 4.6*  ALBUMIN <1.5* 1.6*   No results for input(s): LIPASE, AMYLASE in the last 168 hours. No results for input(s): AMMONIA in the last 168 hours. Coagulation Profile: No results for input(s): INR, PROTIME in the last 168 hours. Cardiac Enzymes: No results for input(s): CKTOTAL, CKMB, CKMBINDEX, TROPONINI in the last 168 hours. BNP (last 3 results) No results for input(s): PROBNP in  the last 8760 hours. HbA1C: No results for input(s): HGBA1C in the last 72 hours. CBG: No results for input(s): GLUCAP in the last 168 hours. Lipid Profile: No results for input(s): CHOL, HDL, LDLCALC, TRIG, CHOLHDL, LDLDIRECT in the last 72 hours. Thyroid Function Tests: No results for input(s): TSH, T4TOTAL, FREET4, T3FREE, THYROIDAB in the last 72 hours. Anemia Panel: Recent Labs    02/23/21 0500  VITAMINB12 2,107*  FOLATE 10.8  TIBC 172*  IRON 131   Sepsis Labs: Recent Labs  Lab 02/22/21 1911 02/22/21 2101  LATICACIDVEN 1.6 1.8    Recent Results (from the past 240 hour(s))  Resp Panel by RT-PCR (Flu A&B, Covid) Nasopharyngeal Swab     Status: None   Collection Time: 02/22/21  7:22 PM   Specimen: Nasopharyngeal Swab; Nasopharyngeal(NP) swabs in vial transport medium  Result Value Ref Range Status   SARS Coronavirus 2 by RT PCR NEGATIVE NEGATIVE Final    Comment: (NOTE) SARS-CoV-2 target nucleic acids are NOT DETECTED.  The SARS-CoV-2 RNA is generally detectable in upper respiratory specimens during the acute phase of infection. The lowest concentration of SARS-CoV-2 viral copies this assay can detect is 138 copies/mL. A negative result does not preclude SARS-Cov-2 infection and should not be used as the sole basis for treatment or other patient management decisions. A negative result may occur with  improper specimen collection/handling, submission of specimen other than nasopharyngeal swab, presence of viral mutation(s) within the areas targeted by this assay, and inadequate number of viral copies(<138 copies/mL). A negative result must be combined with clinical observations, patient history, and epidemiological information. The expected result is Negative.  Fact Sheet for Patients:  EntrepreneurPulse.com.au  Fact Sheet for Healthcare Providers:  IncredibleEmployment.be  This test is no t yet approved or cleared by the  [place] FDA and  has been authorized for detection and/or diagnosis of SARS-CoV-2 by FDA under an Emergency Use Authorization (EUA). This EUA will remain  in effect (meaning this test can be used) for the duration of the COVID-19 declaration under Section 564(b)(1) of the Act, 21 U.S.C.section 360bbb-3(b)(1), unless the authorization is terminated  or revoked sooner.       Influenza A by PCR NEGATIVE NEGATIVE Final   Influenza B by PCR NEGATIVE NEGATIVE Final    Comment: (NOTE) The Xpert Xpress SARS-CoV-2/FLU/RSV plus assay is intended as an aid in the diagnosis of influenza from Nasopharyngeal swab specimens and should not be used as a sole basis for treatment. Nasal washings and aspirates are unacceptable for Xpert Xpress SARS-CoV-2/FLU/RSV testing.  Fact Sheet for Patients: EntrepreneurPulse.com.au  Fact Sheet for Healthcare Providers: IncredibleEmployment.be  This test is not yet approved or cleared by the [place] FDA and has been authorized for detection and/or diagnosis of SARS-CoV-2 by FDA under an Emergency Use Authorization (EUA). This EUA will remain in effect (meaning this test can be used) for the duration of the COVID-19 declaration under Section 564(b)(1) of the Act, 21 U.S.C. section 360bbb-3(b)(1), unless the authorization is terminated or revoked.  Performed at [facility], [place] [address], [place], [place] [address]          Radiology Studies: No results found.      Scheduled Meds:  sodium chloride   Intravenous Once   Continuous Infusions:   sodium bicarbonate (isotonic) infusion in sterile water 100 mL/hr at 02/23/21 1107     LOS: 0 days    Time spent:35 mins. More than 50% of that time was spent in counseling and/or coordination of care.      Shelly Coss, MD [facility] P12/21/2022, 12:51 PM

## 2021-02-23 NOTE — Assessment & Plan Note (Signed)
·   Likely chronic with no clinical evidence of infectious process

## 2021-02-23 NOTE — Progress Notes (Signed)
°  Transition of Care [facility]) Screening Note   Patient Details  Name: Teighan Aubert Date of Birth: 12-09-1940   Transition of Care [facility]) CM/SW Contact:    Benard Halsted, LCSW Phone Number: 02/23/2021, 2:27 PM    Transition of Care Department [facility]) has reviewed patient and no TOC needs have been identified at this time. We will continue to monitor patient advancement through interdisciplinary progression rounds. If new patient transition needs arise, please place a TOC consult.

## 2021-02-23 NOTE — Progress Notes (Signed)
Nutrition Brief Note  RD received a consult for assessment of nutrition requirement/status.   Wt Readings from Last 15 Encounters:  09/07/20 57.8 kg  05/03/20 62.9 kg  05/12/19 66.1 kg  11/12/18 67.8 kg  11/15/15 74.2 kg  04/10/15 66.5 kg  09/20/14 81.6 kg  06/19/14 82.1 kg  06/14/14 83.6 kg  06/20/13 78 kg  05/16/13 77.6 kg  04/30/13 78.9 kg  03/31/13 79.4 kg  01/29/13 78.9 kg  11/27/12 77.6 kg   No new weight within EMR.  Current diet order is Heart Healthy. Labs and medications reviewed.   Pt did not want to discuss nutrition related history with RD; pt declining all nutrition intervention. If nutrition issues arise, please consult RD.    Roxana Hires, RD, LDN Clinical Dietitian See [facility] for contact information.

## 2021-02-23 NOTE — Progress Notes (Signed)
Pt admitted to the unit at 1300. Pt mental status is alert. Pt oriented to room, staff, and call bell. Skin is intact with exception to moisture associated abrasion on sacrum. Full assessment charted in [facility]. Call bell within reach. Visitor guidelines reviewed w/ pt and/or family.

## 2021-02-24 DIAGNOSIS — K922 Gastrointestinal hemorrhage, unspecified: Secondary | ICD-10-CM | POA: Diagnosis not present

## 2021-02-24 LAB — BASIC METABOLIC PANEL
Anion gap: 8 (ref 5–15)
BUN: 33 mg/dL — ABNORMAL HIGH (ref 8–23)
CO2: 17 mmol/L — ABNORMAL LOW (ref 22–32)
Calcium: 7 mg/dL — ABNORMAL LOW (ref 8.9–10.3)
Chloride: 111 mmol/L (ref 98–111)
Creatinine, Ser: 2.09 mg/dL — ABNORMAL HIGH (ref 0.44–1.00)
GFR, Estimated: 24 mL/min — ABNORMAL LOW (ref >60)
Glucose, Bld: 88 mg/dL (ref 70–99)
Potassium: 3.6 mmol/L (ref 3.5–5.1)
Sodium: 136 mmol/L (ref 135–145)

## 2021-02-24 LAB — CBC WITH DIFFERENTIAL/PLATELET
Abs Immature Granulocytes: 0.45 10*3/uL — ABNORMAL HIGH (ref 0.00–0.07)
Basophils Absolute: 0.1 10*3/uL (ref 0.0–0.1)
Basophils Relative: 1 %
Eosinophils Absolute: 0.1 10*3/uL (ref 0.0–0.5)
Eosinophils Relative: 0 %
HCT: 40 % (ref 36.0–46.0)
Hemoglobin: 12.9 g/dL (ref 12.0–15.0)
Immature Granulocytes: 3 %
Lymphocytes Relative: 6 %
Lymphs Abs: 1 10*3/uL (ref 0.7–4.0)
MCH: 21.8 pg — ABNORMAL LOW (ref 26.0–34.0)
MCHC: 32.3 g/dL (ref 30.0–36.0)
MCV: 67.7 fL — ABNORMAL LOW (ref 80.0–100.0)
Monocytes Absolute: 2.8 10*3/uL — ABNORMAL HIGH (ref 0.1–1.0)
Monocytes Relative: 17 %
Neutro Abs: 12.1 10*3/uL — ABNORMAL HIGH (ref 1.7–7.7)
Neutrophils Relative %: 73 %
Platelets: 255 10*3/uL (ref 150–400)
RBC: 5.91 MIL/uL — ABNORMAL HIGH (ref 3.87–5.11)
Smear Review: NORMAL
WBC: 16.5 10*3/uL — ABNORMAL HIGH (ref 4.0–10.5)
nRBC: 0.4 % — ABNORMAL HIGH (ref 0.0–0.2)

## 2021-02-24 LAB — BPAM RBC
Blood Product Expiration Date: 202301102359
Blood Product Expiration Date: 202301122359
Blood Product Expiration Date: 202301122359
ISSUE DATE / TIME: 202212202139
ISSUE DATE / TIME: 202212202332
ISSUE DATE / TIME: 202212210208
Unit Type and Rh: 7300
Unit Type and Rh: 7300
Unit Type and Rh: 7300

## 2021-02-24 LAB — TYPE AND SCREEN
ABO/RH(D): B POS
Antibody Screen: NEGATIVE
Unit division: 0
Unit division: 0
Unit division: 0

## 2021-02-24 MED ORDER — OYSTER SHELL CALCIUM/D3 500-5 MG-MCG PO TABS
1.0000 | ORAL_TABLET | Freq: Every day | ORAL | Status: DC
  Administered 2021-02-24 – 2021-02-26 (×3): 1 via ORAL
  Filled 2021-02-24 (×3): qty 1

## 2021-02-24 MED ORDER — SODIUM BICARBONATE 650 MG PO TABS
1300.0000 mg | ORAL_TABLET | Freq: Four times a day (QID) | ORAL | Status: DC
  Administered 2021-02-24 (×3): 1300 mg via ORAL
  Filled 2021-02-24 (×4): qty 2

## 2021-02-24 MED ORDER — LACTATED RINGERS IV BOLUS
500.0000 mL | Freq: Once | INTRAVENOUS | Status: AC
  Administered 2021-02-24: 04:00:00 500 mL via INTRAVENOUS

## 2021-02-24 NOTE — Progress Notes (Signed)
PROGRESS NOTE    Shannon Terry  FGH:[id number] DOB: March 24, 1940 DOA: 02/22/2021 PCP: Patient, No Pcp Per (Inactive)   Chief Complain: Weakness, diarrhea  Brief Narrative: Patient is a 80 year old female with history of rectal GIST tumor diagnosed in 2014, not following with oncology /not taking any treatment, frequent hospitalization for severe blood loss anemia from lower GI bleed from the tumor, history of CVA, diastolic congestive heart failure, hypertension, CKD stage IIIb who presented here with complaints of generalized weakness, diarrhea.  She was weak and was unable to ambulate, complained of lightheadedness.  On presentation her hemoglobin was 3.7.  Patient was transfused with 3 units of PRBC, hemoglobin this morning stable in the range of 11.  Patient denies any frank hematochezia or melena.  She does not want any further work-up for her rectal tumor/anemia and does not have plan to follow-up with her oncologist( Dr Marin Olp) . Creatinine found to be elevated from her baseline, patient looks dehydrated, keeping her 1 more night for IV fluids. PT/OT consulted. She may be stable for discharge tomorrow if her kidney function and hemoglobin remained stable.  02/24/2021: Patient was seen and examined at his bedside.  Endorses that the bed is uncomfortable however she would not elaborate.  No reported recurrent overt GI bleed.  Assessment & Plan:   Principal Problem:   Acute lower GI bleeding Active Problems:   Malignant GIST ([place])   Essential hypertension   Acute blood loss anemia   Chronic diastolic CHF (congestive heart failure) (HCC)   Hypokalemia due to excessive gastrointestinal loss of potassium   Metabolic acidosis   CKD (chronic kidney disease), stage IV (HCC)   Chronic diarrhea   Leukocytosis   Acute on chronic blood loss /microcytic anemia from lower GI bleed secondary to rectal GIST tumor: Has been admitted several times for this in the past. Presented with hemoglobin in the  range of 3, transfused  3 weeks of PRBC.  Hemoglobin stable in the range of 11 today.  Denies any frank hematochezia or melena.  She might have chronic blood loss from her rectal tumor.  Check CBC tomorrow.  She needs to continue iron supplementation on discharge  Rectal GIST tumor: Previously following up  with oncology, Dr. Marin Olp.  Diagnosed in 2016.  Was on imatinib in the past.  Does not follow with her oncologist anymore because she does not want to seek the treatment.  She says she just wants to come to the hospital if she becomes sick and she does not want to follow-up with any of the doctors including PCP. Patient is not interested on palliative care discussion.  Diarrhea: History of recent diarrhea, takes MiraLAX at home.  No further work-up necessary for now.  Resolved post repletion: Hypokalemia: Supplement with potassium  Leukocytosis: Chronic.  Most likely associated with malignancy.  Afebrile.  Continue to monitor.  CKD stage 4/non-anion gap metabolic acidosis: She has progressive CKD.  Her last creatinine was 2.08 as per 09/08/2020.   She appears to be back to her baseline creatinine 2.09 with GFR of 24.   Metabolic acidosis is slowly improving.  Start p.o. sodium bicarb for serum bicarb of 17.  Monitor potassium level while on bicarb replacement  Chronic diastolic congestive heart failure: Currently euvolemic/dehydrated.  Continue IV fluids for now.  Hypocalcemia:  Corrected calcium for albumin 8.8. Repleted orally.  Severe protein calorie malnutrition: Albumin of 1.6.  Nutrition consulted.  Secondary to malignancy, poor nutrition  DVT prophylaxis:SCD Code Status: DNR Family Communication: None at bedside. Patient status:Inpatient  Dispo: The patient is from: Home              Anticipated d/c is to: Home possibly on 02/26/2019              Anticipated d/c date is: tomorrow  Consultants: None  Procedures:None  Antimicrobials:  Anti-infectives (From  admission, onward)    None       Objective: Vitals:   02/24/21 0305 02/24/21 0325 02/24/21 0747 02/24/21 1155  BP: 91/68  (!) 87/61 106/68  Pulse: 82  90 74  Resp: 18 16 19 16   Temp: 98.2 F (36.8 C)  98.1 F (36.7 C) 98.2 F (36.8 C)  TempSrc: Oral  Oral Oral  SpO2: 99%  96% 99%    Intake/Output Summary (Last 24 hours) at 02/24/2021 1157 Last data filed at 02/24/2021 0500 Gross per 24 hour  Intake 1782.34 ml  Output 25 ml  Net 1757.34 ml   There were no vitals filed for this visit.  Examination:  General exam: In no acute distress.  She is alert and oriented x3.   HEENT: PERRL Respiratory system: Clear to auscultation with no wheezes or rales.  Good inspiratory effort.   Cardiovascular system: Regular rate and rhythm no rubs or gallops. Gastrointestinal system: Soft nontender normal bowel sounds present.   Central nervous system: Alert and oriented.  Moves all 4 extremities.   Extremities: No lower extremity edema bilaterally.   Skin: No rashes or ulcerative lesions noted    Data Reviewed: I have personally reviewed following labs and imaging studies  CBC: Recent Labs  Lab 02/22/21 1909 02/23/21 0500 02/23/21 1050 02/24/21 0058  WBC 13.4* 16.1* 16.1* 16.5*  NEUTROABS 9.8*  --   --  12.1*  HGB 3.7* 11.0* 11.5* 12.9  HCT 15.0* 36.0 37.3 40.0  MCV 51.2* 70.6* 69.7* 67.7*  PLT 338 114* 283 673   Basic Metabolic Panel: Recent Labs  Lab 02/22/21 1909 02/23/21 0500 02/24/21 0058  NA 136 138 136  K 3.1* 3.9 3.6  CL 115* 115* 111  CO2 16* 17* 17*  GLUCOSE 98 102* 88  BUN 33* 32* 33*  CREATININE 2.17* 2.21* 2.09*  CALCIUM 6.8* 7.0* 7.0*  MG  --  2.0  --    GFR: CrCl cannot be calculated (Unknown ideal weight.). Liver Function Tests: Recent Labs  Lab 02/22/21 1909 02/23/21 0500  AST 23 21  ALT 25 28  ALKPHOS 75 87  BILITOT 0.9 1.8*  PROT 4.4* 4.6*  ALBUMIN <1.5* 1.6*   No results for input(s): LIPASE, AMYLASE in the last 168 hours. No  results for input(s): AMMONIA in the last 168 hours. Coagulation Profile: No results for input(s): INR, PROTIME in the last 168 hours. Cardiac Enzymes: No results for input(s): CKTOTAL, CKMB, CKMBINDEX, TROPONINI in the last 168 hours. BNP (last 3 results) No results for input(s): PROBNP in the last 8760 hours. HbA1C: No results for input(s): HGBA1C in the last 72 hours. CBG: No results for input(s): GLUCAP in the last 168 hours. Lipid Profile: No results for input(s): CHOL, HDL, LDLCALC, TRIG, CHOLHDL, LDLDIRECT in the last 72 hours. Thyroid Function Tests: No results for input(s): TSH, T4TOTAL, FREET4, T3FREE, THYROIDAB in the last 72 hours. Anemia Panel: Recent Labs    02/23/21 0500  VITAMINB12 2,107*  FOLATE 10.8  TIBC 172*  IRON 131   Sepsis Labs: Recent Labs  Lab 02/22/21 1911 02/22/21 2101  LATICACIDVEN  1.6 1.8    Recent Results (from the past 240 hour(s))  Resp Panel by RT-PCR (Flu A&B, Covid) Nasopharyngeal Swab     Status: None   Collection Time: 02/22/21  7:22 PM   Specimen: Nasopharyngeal Swab; Nasopharyngeal(NP) swabs in vial transport medium  Result Value Ref Range Status   SARS Coronavirus 2 by RT PCR NEGATIVE NEGATIVE Final    Comment: (NOTE) SARS-CoV-2 target nucleic acids are NOT DETECTED.  The SARS-CoV-2 RNA is generally detectable in upper respiratory specimens during the acute phase of infection. The lowest concentration of SARS-CoV-2 viral copies this assay can detect is 138 copies/mL. A negative result does not preclude SARS-Cov-2 infection and should not be used as the sole basis for treatment or other patient management decisions. A negative result may occur with  improper specimen collection/handling, submission of specimen other than nasopharyngeal swab, presence of viral mutation(s) within the areas targeted by this assay, and inadequate number of viral copies(<138 copies/mL). A negative result must be combined with clinical observations,  patient history, and epidemiological information. The expected result is Negative.  Fact Sheet for Patients:  EntrepreneurPulse.com.au  Fact Sheet for Healthcare Providers:  IncredibleEmployment.be  This test is no t yet approved or cleared by the [place] FDA and  has been authorized for detection and/or diagnosis of SARS-CoV-2 by FDA under an Emergency Use Authorization (EUA). This EUA will remain  in effect (meaning this test can be used) for the duration of the COVID-19 declaration under Section 564(b)(1) of the Act, 21 U.S.C.section 360bbb-3(b)(1), unless the authorization is terminated  or revoked sooner.       Influenza A by PCR NEGATIVE NEGATIVE Final   Influenza B by PCR NEGATIVE NEGATIVE Final    Comment: (NOTE) The Xpert Xpress SARS-CoV-2/FLU/RSV plus assay is intended as an aid in the diagnosis of influenza from Nasopharyngeal swab specimens and should not be used as a sole basis for treatment. Nasal washings and aspirates are unacceptable for Xpert Xpress SARS-CoV-2/FLU/RSV testing.  Fact Sheet for Patients: EntrepreneurPulse.com.au  Fact Sheet for Healthcare Providers: IncredibleEmployment.be  This test is not yet approved or cleared by the [place] FDA and has been authorized for detection and/or diagnosis of SARS-CoV-2 by FDA under an Emergency Use Authorization (EUA). This EUA will remain in effect (meaning this test can be used) for the duration of the COVID-19 declaration under Section 564(b)(1) of the Act, 21 U.S.C. section 360bbb-3(b)(1), unless the authorization is terminated or revoked.  Performed at [facility], [place] [address] [place], [place] [address]          Radiology Studies: No results found.      Scheduled Meds:  sodium chloride   Intravenous Once   calcium-vitamin D  1 tablet Oral Q breakfast   Continuous Infusions:   sodium bicarbonate  (isotonic) infusion in sterile water 125 mL/hr at 02/24/21 1124     LOS: 1 day    Time spent:35 mins. More than 50% of that time was spent in counseling and/or coordination of care.      Kayleen Memos, MD [facility] P12/22/2022, 11:57 AM

## 2021-02-24 NOTE — Evaluation (Signed)
Physical Therapy Evaluation Patient Details Name: Shannon Terry MRN: [id number] DOB: 09/27/1940 Today's Date: 02/24/2021  History of Present Illness  80 yo admitted 12/20 with weakness and diarrhea. Hgb 3.7 on admission with 3 units PRBC. PMhx: anemia, rectal CA, CVA, CHF, HTN, CKD  Clinical Impression  Pt pleasant with report of feet being cold and legs feeling like lead. Pt able to sit EOB attempt standing but would not fully rise or perform transfers OOB. Pt lives with spouse and currently limited by cognition, decreased strength, balance and function who will benefit from acute therapy to maximize mobility, safety and function. Encouraged increased mobility with nursing and further attempts at [place].  HR 72-123 with activity BP 94/74 (81)        Recommendations for follow up therapy are one component of a multi-disciplinary discharge planning process, led by the attending physician.  Recommendations may be updated based on patient status, additional functional criteria and insurance authorization.  Follow Up Recommendations Skilled nursing-short term rehab (<3 hours/day)    Assistance Recommended at Discharge Frequent or constant Supervision/Assistance  Functional Status Assessment Patient has had a recent decline in their functional status and demonstrates the ability to make significant improvements in function in a reasonable and predictable amount of time.  Equipment Recommendations  Rolling walker (2 wheels);BSC/3in1;Wheelchair (measurements PT)    Recommendations for Other Services       Precautions / Restrictions Precautions Precautions: Fall Precaution Comments: rectal pouch      Mobility  Bed Mobility Overal bed mobility: Needs Assistance Bed Mobility: Supine to Sit;Sit to Supine     Supine to sit: Min assist Sit to supine: Min assist   General bed mobility comments: increased time with physical assist to rise from surface, assist to clear legs to return to supine.     Transfers Overall transfer level: Needs assistance   Transfers: Sit to/from Stand Sit to Stand: Min assist           General transfer comment: min assist to rise from surface, pt maintaining crouched posture and would not fully rise x 2 trials. scooting at EOB with supervision    Ambulation/Gait                  Stairs            Wheelchair Mobility    Modified Rankin (Stroke Patients Only)       Balance Overall balance assessment: Needs assistance   Sitting balance-Leahy Scale: Fair     Standing balance support: Bilateral upper extremity supported Standing balance-Leahy Scale: Poor Standing balance comment: RW and physical assist                             Pertinent Vitals/Pain Pain Assessment: No/denies pain    Home Living Family/patient expects to be discharged to:: Private residence Living Arrangements: Spouse/significant other Available Help at Discharge: Family;Available 24 hours/day Type of Home: House Home Access: Stairs to enter Entrance Stairs-Rails: None Entrance Stairs-Number of Steps: 1   Home Layout: One level Home Equipment: Conservation officer, nature (2 wheels)      Prior Function Prior Level of Function : Independent/Modified Independent               ADLs Comments: IADLs     Hand Dominance   Dominant Hand: Right    Extremity/Trunk Assessment   Upper Extremity Assessment Upper Extremity Assessment: Generalized weakness RUE Deficits / Details: edema    Lower  Extremity Assessment Lower Extremity Assessment: Generalized weakness    Cervical / Trunk Assessment Cervical / Trunk Assessment: Kyphotic  Communication   Communication: No difficulties  Cognition Arousal/Alertness: Awake/alert Behavior During Therapy: WFL for tasks assessed/performed Overall Cognitive Status: Impaired/Different from baseline Area of Impairment: Following commands;Problem solving                       Following  Commands: Follows one step commands inconsistently     Problem Solving: Slow processing;Requires verbal cues;Requires tactile cues General Comments: pt stating she has cold feet and that's why she can't move, not oriented to time, no awareness of deficit and inability to care for herself        General Comments      Exercises General Exercises - Lower Extremity Short Arc QuadSinclair Ship;Both;Seated;10 reps Hip Flexion/Marching: AAROM;Both;Seated;10 reps   Assessment/Plan    PT Assessment Patient needs continued PT services  PT Problem List Decreased strength;Decreased mobility;Decreased safety awareness;Decreased activity tolerance;Decreased cognition;Decreased balance;Decreased knowledge of use of DME       PT Treatment Interventions Gait training;Balance training;Functional mobility training;Therapeutic activities;Patient/family education;Cognitive remediation;Stair training;DME instruction;Therapeutic exercise    PT Goals (Current goals can be found in the Care Plan section)  Acute Rehab PT Goals Patient Stated Goal: return home PT Goal Formulation: With patient Time For Goal Achievement: 03/10/21 Potential to Achieve Goals: Fair    Frequency Min 3X/week   Barriers to discharge Decreased caregiver support      Co-evaluation               AM-PAC PT "6 Clicks" Mobility  Outcome Measure Help needed turning from your back to your side while in a flat bed without using bedrails?: A Little Help needed moving from lying on your back to sitting on the side of a flat bed without using bedrails?: A Little Help needed moving to and from a bed to a chair (including a wheelchair)?: A Lot Help needed standing up from a chair using your arms (e.g., wheelchair or bedside chair)?: A Lot Help needed to walk in hospital room?: Total Help needed climbing 3-5 steps with a railing? : Total 6 Click Score: 12    End of Session Equipment Utilized During Treatment: Gait belt Activity  Tolerance: Patient limited by fatigue Patient left: in bed;with call bell/phone within reach Nurse Communication: Mobility status PT Visit Diagnosis: Other abnormalities of gait and mobility (R26.89);Difficulty in walking, not elsewhere classified (R26.2);Muscle weakness (generalized) (M62.81)    Time: 0388-8280 PT Time Calculation (min) (ACUTE ONLY): 24 min   Charges:   PT Evaluation $PT Eval Moderate Complexity: 1 Mod PT Treatments $Therapeutic Activity: 8-22 mins        Mouna Yager P, PT Acute Rehabilitation Services Pager: [phone] Office: [place] [facility] Kendyn Zaman 02/24/2021, 10:55 AM

## 2021-02-24 NOTE — TOC Initial Note (Signed)
Transition of Care [facility]) - Initial/Assessment Note    Patient Details  Name: Shannon Terry MRN: [id number] Date of Birth: 1940/09/04  [facility] CM/SW Contact:    Cyndi Bender, RN Phone Number: 02/24/2021, 11:12 AM  Clinical Narrative:                 Attempted to call patient to find out if she has a PCP and the patient didn't answer the phone. I chatted with the nurse and she states the patient doesn't want to speak to anyone. I requested that the nurse notify me when patient is ready to talk.         Patient Goals and CMS Choice        Expected Discharge Plan and Services                                                Prior Living Arrangements/Services                       Activities of Daily Living Home Assistive Devices/Equipment: None ADL Screening (condition at time of admission) Patient's cognitive ability adequate to safely complete daily activities?: Yes Is the patient deaf or have difficulty hearing?: No Does the patient have difficulty seeing, even when wearing glasses/contacts?: No Does the patient have difficulty concentrating, remembering, or making decisions?: Yes Patient able to express need for assistance with ADLs?: Yes Does the patient have difficulty dressing or bathing?: Yes Independently performs ADLs?: No Communication: Independent Grooming: Independent Feeding: Independent Bathing: Needs assistance Is this a change from baseline?: Change from baseline, expected to last <3 days Toileting: Needs assistance Is this a change from baseline?: Change from baseline, expected to last <3 days In/Out Bed: Needs assistance Is this a change from baseline?: Change from baseline, expected to last <3 days Does the patient have difficulty walking or climbing stairs?: Yes Weakness of Legs: Both Weakness of Arms/Hands: Right  Permission Sought/Granted                  Emotional Assessment               Admission diagnosis:  Acute lower GI bleeding [K92.2] Acute blood loss anemia [D62] Patient Active Problem List   Diagnosis Date Noted   Leukocytosis 02/23/2021   Acute lower GI bleeding 02/22/2021   Hypokalemia due to excessive gastrointestinal loss of potassium 40/98/1191   Metabolic acidosis 47/82/9562   CKD (chronic kidney disease), stage IV ([place]) 02/22/2021   Chronic diarrhea 02/22/2021   Symptomatic anemia 09/06/2020   Chronic kidney disease, stage 3b ([place]) 09/06/2020   Acute blood loss anemia 05/03/2020   AKI (acute kidney injury) ([place]) 05/03/2020   Malignant cachexia ([place]) 05/03/2020   Hemiplegia and hemiparesis following cerebral infarction affecting left non-dominant side ([place]) 05/03/2020   Chronic diastolic CHF (congestive heart failure) ([place]) 05/03/2020   GI bleed 05/11/2019   GIB (gastrointestinal bleeding) 11/10/2018   Left leg weakness 11/15/2015   History of CVA (cerebrovascular accident) 11/15/2015   Rectal carcinoma ([place])    Acute CVA (cerebrovascular accident) ([place]) 04/08/2015   Essential hypertension 04/07/2015   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

## 2021-02-24 NOTE — Evaluation (Signed)
Occupational Therapy Evaluation Patient Details Name: Shannon Terry MRN: [id number] DOB: 08-19-1940 Today's Date: 02/24/2021   History of Present Illness 80 yo admitted 12/20 with weakness and diarrhea. Hgb 3.7 on admission with 3 units PRBC. PMhx: anemia, rectal CA, CVA, CHF, HTN, CKD   Clinical Impression   PTA patient reports independent with ADLs, IADLs and mobility.  She was admitted for above and presents with problem list below, including weakness, decreased activity tolerance, R UE edema and impaired cognition.  Pt currently requires total assist for LB ADLs and min assist for UB Adls.  She is disoriented to time, follows simple commands but presents with poor awareness to safety and deficits. She declines OOB during session, therefore limited to bed level.  Patient will benefit from continued OT services while admitted and after dc at SNF level to optimize independence, safety and return to PLOF.       Recommendations for follow up therapy are one component of a multi-disciplinary discharge planning process, led by the attending physician.  Recommendations may be updated based on patient status, additional functional criteria and insurance authorization.   Follow Up Recommendations  Skilled nursing-short term rehab (<3 hours/day)    Assistance Recommended at Discharge Frequent or constant Supervision/Assistance  Functional Status Assessment  Patient has had a recent decline in their functional status and demonstrates the ability to make significant improvements in function in a reasonable and predictable amount of time.  Equipment Recommendations  Other (comment) (TBD)    Recommendations for Other Services       Precautions / Restrictions Precautions Precautions: Fall Precaution Comments: rectal pouch Restrictions Weight Bearing Restrictions: No      Mobility Bed Mobility Overal bed mobility: Needs Assistance Bed Mobility: Rolling Rolling: Min assist   Supine to sit:  Min assist Sit to supine: Min assist   General bed mobility comments: min assist to roll in bed, declined EOB    Transfers Overall transfer level: Needs assistance   Transfers: Sit to/from Stand Sit to Stand: Min assist           General transfer comment: min assist to rise from surface, pt maintaining crouched posture and would not fully rise x 2 trials. scooting at EOB with supervision      Balance Overall balance assessment: Needs assistance   Sitting balance-Leahy Scale: Fair     Standing balance support: Bilateral upper extremity supported Standing balance-Leahy Scale: Poor Standing balance comment: RW and physical assist                           ADL either performed or assessed with clinical judgement   ADL Overall ADL's : Needs assistance/impaired     Grooming: Set up;Bed level           Upper Body Dressing : Minimal assistance;Bed level   Lower Body Dressing: Total assistance;Bed level     Toilet Transfer Details (indicate cue type and reason): pt refused OOB         Functional mobility during ADLs: Minimal assistance (limited to bed level) General ADL Comments: pt refused OOB reports "Im just not ready".     Vision   Vision Assessment?: No apparent visual deficits     Perception     Praxis      Pertinent Vitals/Pain Pain Assessment: No/denies pain     Hand Dominance Right   Extremity/Trunk Assessment Upper Extremity Assessment Upper Extremity Assessment: Generalized weakness RUE Deficits / Details: R UE  edema   Lower Extremity Assessment Lower Extremity Assessment: Defer to PT evaluation   Cervical / Trunk Assessment Cervical / Trunk Assessment: Kyphotic   Communication Communication Communication: No difficulties   Cognition Arousal/Alertness: Awake/alert Behavior During Therapy: WFL for tasks assessed/performed Overall Cognitive Status: Impaired/Different from baseline Area of Impairment: Following  commands;Problem solving;Awareness;Safety/judgement                       Following Commands: Follows one step commands consistently;Follows one step commands with increased time Safety/Judgement: Decreased awareness of safety;Decreased awareness of deficits Awareness: Emergent Problem Solving: Slow processing;Requires verbal cues General Comments: pt oriented to self, place but not time (able to correct after min cueing- initally reports nov), poor awareness to deficits and safety (pt reports "i thought i could go home today")     General Comments  VSS    Exercises General Exercises - Lower Extremity Short Arc Quad: AAROM;Both;Seated;10 reps Hip Flexion/Marching: AAROM;Both;Seated;10 reps   Shoulder Instructions      Home Living Family/patient expects to be discharged to:: Private residence Living Arrangements: Spouse/significant other Available Help at Discharge: Family;Available 24 hours/day Type of Home: House Home Access: Stairs to enter [employer] of Steps: 1 Entrance Stairs-Rails: None Home Layout: One level     Bathroom Shower/Tub: Teacher, early years/pre: Handicapped height     Home Equipment: Conservation officer, nature (2 wheels)          Prior Functioning/Environment Prior Level of Function : Independent/Modified Independent               ADLs Comments: IADLs        OT Problem List: Decreased strength;Decreased activity tolerance;Impaired balance (sitting and/or standing);Decreased cognition;Decreased safety awareness;Decreased knowledge of use of DME or AE;Decreased knowledge of precautions      OT Treatment/Interventions: Self-care/ADL training;Therapeutic exercise;DME and/or AE instruction;Therapeutic activities;Cognitive remediation/compensation;Patient/family education;Balance training    OT Goals(Current goals can be found in the care plan section) Acute Rehab OT Goals Patient Stated Goal: to get home OT Goal  Formulation: With patient Time For Goal Achievement: 03/10/21 Potential to Achieve Goals: Good  OT Frequency: Min 2X/week   Barriers to D/C:            Co-evaluation              AM-PAC OT "6 Clicks" Daily Activity     Outcome Measure Help from another person eating meals?: A Little Help from another person taking care of personal grooming?: A Little Help from another person toileting, which includes using toliet, bedpan, or urinal?: Total Help from another person bathing (including washing, rinsing, drying)?: A Lot Help from another person to put on and taking off regular upper body clothing?: A Little Help from another person to put on and taking off regular lower body clothing?: Total 6 Click Score: 13   End of Session Nurse Communication: Mobility status  Activity Tolerance: Patient tolerated treatment well Patient left: in bed;with call bell/phone within reach;with bed alarm set  OT Visit Diagnosis: Other abnormalities of gait and mobility (R26.89);Muscle weakness (generalized) (M62.81);Other symptoms and signs involving cognitive function                Time: 1660-6301 OT Time Calculation (min): 13 min Charges:  OT General Charges $OT Visit: 1 Visit OT Evaluation $OT Eval Moderate Complexity: 1 Mod  Jolaine Artist, OT Acute Rehabilitation Services Pager [phone] Office [phone]   Delight Stare 02/24/2021, 11:04 AM

## 2021-02-25 ENCOUNTER — Inpatient Hospital Stay (HOSPITAL_COMMUNITY): Payer: Medicare Other

## 2021-02-25 DIAGNOSIS — Z515 Encounter for palliative care: Secondary | ICD-10-CM

## 2021-02-25 DIAGNOSIS — K922 Gastrointestinal hemorrhage, unspecified: Secondary | ICD-10-CM | POA: Diagnosis not present

## 2021-02-25 DIAGNOSIS — Z7189 Other specified counseling: Secondary | ICD-10-CM

## 2021-02-25 DIAGNOSIS — R609 Edema, unspecified: Secondary | ICD-10-CM

## 2021-02-25 LAB — URINALYSIS, ROUTINE W REFLEX MICROSCOPIC
Bilirubin Urine: NEGATIVE
Glucose, UA: NEGATIVE mg/dL
Ketones, ur: NEGATIVE mg/dL
Nitrite: NEGATIVE
Protein, ur: 100 mg/dL — AB
Specific Gravity, Urine: 1.016 (ref 1.005–1.030)
WBC, UA: >50 WBC/hpf — ABNORMAL HIGH (ref 0–5)
pH: 6 (ref 5.0–8.0)

## 2021-02-25 LAB — MAGNESIUM: Magnesium: 1.9 mg/dL (ref 1.7–2.4)

## 2021-02-25 LAB — COMPREHENSIVE METABOLIC PANEL
ALT: 23 U/L (ref 0–44)
AST: 22 U/L (ref 15–41)
Albumin: 1.5 g/dL — ABNORMAL LOW (ref 3.5–5.0)
Alkaline Phosphatase: 91 U/L (ref 38–126)
Anion gap: 7 (ref 5–15)
BUN: 30 mg/dL — ABNORMAL HIGH (ref 8–23)
CO2: 24 mmol/L (ref 22–32)
Calcium: 6.7 mg/dL — ABNORMAL LOW (ref 8.9–10.3)
Chloride: 106 mmol/L (ref 98–111)
Creatinine, Ser: 2.02 mg/dL — ABNORMAL HIGH (ref 0.44–1.00)
GFR, Estimated: 25 mL/min — ABNORMAL LOW (ref >60)
Glucose, Bld: 56 mg/dL — ABNORMAL LOW (ref 70–99)
Potassium: 3.3 mmol/L — ABNORMAL LOW (ref 3.5–5.1)
Sodium: 137 mmol/L (ref 135–145)
Total Bilirubin: 1.2 mg/dL (ref 0.3–1.2)
Total Protein: 4.7 g/dL — ABNORMAL LOW (ref 6.5–8.1)

## 2021-02-25 LAB — CBC WITH DIFFERENTIAL/PLATELET
Abs Immature Granulocytes: 0.2 10*3/uL — ABNORMAL HIGH (ref 0.00–0.07)
Basophils Absolute: 0.1 10*3/uL (ref 0.0–0.1)
Basophils Relative: 0 %
Eosinophils Absolute: 0 10*3/uL (ref 0.0–0.5)
Eosinophils Relative: 0 %
HCT: 39.8 % (ref 36.0–46.0)
Hemoglobin: 12.5 g/dL (ref 12.0–15.0)
Immature Granulocytes: 1 %
Lymphocytes Relative: 9 %
Lymphs Abs: 1.3 10*3/uL (ref 0.7–4.0)
MCH: 21.2 pg — ABNORMAL LOW (ref 26.0–34.0)
MCHC: 31.4 g/dL (ref 30.0–36.0)
MCV: 67.5 fL — ABNORMAL LOW (ref 80.0–100.0)
Monocytes Absolute: 2.7 10*3/uL — ABNORMAL HIGH (ref 0.1–1.0)
Monocytes Relative: 19 %
Neutro Abs: 10.3 10*3/uL — ABNORMAL HIGH (ref 1.7–7.7)
Neutrophils Relative %: 71 %
Platelets: 215 10*3/uL (ref 150–400)
RBC: 5.9 MIL/uL — ABNORMAL HIGH (ref 3.87–5.11)
WBC: 14.6 10*3/uL — ABNORMAL HIGH (ref 4.0–10.5)
nRBC: 0 % (ref 0.0–0.2)

## 2021-02-25 LAB — GLUCOSE, CAPILLARY
Glucose-Capillary: 57 mg/dL — ABNORMAL LOW (ref 70–99)
Glucose-Capillary: 115 mg/dL — ABNORMAL HIGH (ref 70–99)

## 2021-02-25 LAB — PHOSPHORUS: Phosphorus: 3.4 mg/dL (ref 2.5–4.6)

## 2021-02-25 MED ORDER — DEXTROSE 50 % IV SOLN
50.0000 mL | INTRAVENOUS | Status: AC
  Administered 2021-02-25: 06:00:00 50 mL via INTRAVENOUS
  Filled 2021-02-25: qty 50

## 2021-02-25 MED ORDER — HEPARIN (PORCINE) 25000 UT/250ML-% IV SOLN
1050.0000 [IU]/h | INTRAVENOUS | Status: DC
  Filled 2021-02-25: qty 250

## 2021-02-25 MED ORDER — DEXTROSE 10 % IV SOLN: INTRAVENOUS | Status: DC

## 2021-02-25 MED ORDER — ENOXAPARIN SODIUM 60 MG/0.6ML IJ SOSY
1.0000 mg/kg | PREFILLED_SYRINGE | INTRAMUSCULAR | Status: DC
  Administered 2021-02-25 – 2021-02-26 (×2): 57.5 mg via SUBCUTANEOUS
  Filled 2021-02-25 (×2): qty 0.6

## 2021-02-25 MED ORDER — CALCIUM GLUCONATE-NACL 1-0.675 GM/50ML-% IV SOLN
1.0000 g | Freq: Once | INTRAVENOUS | Status: AC
  Administered 2021-02-25: 06:00:00 1000 mg via INTRAVENOUS
  Filled 2021-02-25: qty 50

## 2021-02-25 MED ORDER — KCL IN DEXTROSE-NACL 40-5-0.9 MEQ/L-%-% IV SOLN
INTRAVENOUS | Status: AC
  Filled 2021-02-25 (×2): qty 1000

## 2021-02-25 MED ORDER — POTASSIUM CHLORIDE CRYS ER 20 MEQ PO TBCR: 40.0000 meq | EXTENDED_RELEASE_TABLET | Freq: Two times a day (BID) | ORAL | Status: DC

## 2021-02-25 NOTE — Care Management Important Message (Signed)
Important Message  Patient Details  Name: Shannon Terry MRN: [id number] Date of Birth: 07/04/1940   Medicare Important Message Given:  Yes     [name] [address] 02/25/2021, 4:00 PM

## 2021-02-25 NOTE — Progress Notes (Addendum)
PROGRESS NOTE    Shannon Terry  TGG:[id number] DOB: 10/11/1940 DOA: 02/22/2021 PCP: Patient, No Pcp Per (Inactive)   Chief Complain: Weakness, diarrhea  Brief Narrative: Patient is a 80 year old female with history of rectal GIST tumor diagnosed in 2014, not following with oncology /not taking any treatment, frequent hospitalization for severe blood loss anemia from lower GI bleed from the tumor, history of CVA, diastolic congestive heart failure, hypertension, CKD stage IIIb who presented here with complaints of generalized weakness, diarrhea.  She was weak, unable to ambulate, lightheaded.  On presentation her hemoglobin was 3.7K.  Patient was transfused with 3 units of PRBCs, hemoglobin now stable with no reported overt bleeding.  She does not want any further work-up for her rectal tumor/anemia and does not have plan to follow-up with her oncologist(Dr Ennever).   Hospital course complicated by medical noncompliance with refusal to eat or take her medications orally.  Also complicated by right upper extremity extensive DVT noted on right upper extremity Doppler ultrasound done on 02/25/2021 for which she was started on heparin drip.  Palliative care team has been consulted to assist with establishing goals of care.  02/25/2021: Patient was seen and examined at bedside this morning.  She is easily arousable to voices.  She declined answering any questions.  Noted right upper extremity edema on exam, however the patient would not say whether or not it was tender to palpation.  Right upper extremity Doppler ultrasound is positive for extensive DVT.  Assessment & Plan:   Principal Problem:   Acute lower GI bleeding Active Problems:   Malignant GIST ([name])   Essential hypertension   Acute blood loss anemia   Chronic diastolic CHF (congestive heart failure) (HCC)   Hypokalemia due to excessive gastrointestinal loss of potassium   Metabolic acidosis   CKD (chronic kidney disease), stage IV  (HCC)   Chronic diarrhea   Leukocytosis   Acute on chronic blood loss /microcytic anemia from lower GI bleed secondary to rectal GIST tumor:  Has been admitted several times for this in the past. Presented with hemoglobin in the range of 3K, transfused 3 units of PRBCs.  Hemoglobin now stable 12.5K on 02/25/2021 prior to starting heparin drip.  No overt bleeding Closely monitor H&H while on heparin drip in the setting of recent acute blood loss.  Rectal GIST tumor: Previously following with oncology, Dr. Marin Olp.  Diagnosed in 2016.  Was on imatinib in the past.  Does not follow with her oncologist anymore because she does not want to seek the treatment.  She says she just wants to come to the hospital if she becomes sick and she does not want to follow-up with any of the doctors including PCP. Palliative care team consulted on 02/25/2021 to assist with establishing goals of care.  Newly diagnosed right upper extremity DVT in the setting of recent lower GI bleed Pharmacological DVT prophylaxis were held due to recent acute blood loss anemia from GI bleed, however patient was on SCDs, discontinued on 02/25/2021, due to high risk of thrombosis from lower extremities in the setting of malignancy. Pharmacy consulted for heparin drip Closely monitor H&H while on heparin drip  Refractory, post repletion: Hypokalemia:  Serum potassium 3.3 Repleted intravenously through continuous IV fluids [id number] 33meq at 50cc/hr Repeat BMP in the morning  Hypercalcemia Corrected calcium for albumin 8.7 Repleted intravenously calcium gluconate x1 g.  Leukocytosis in the setting of malignancy and DVT:  WBC is downtrending  AKI on CKD 3B, suspect prerenal in  the setting of dehydration from poor oral intake Baseline creatinine appears to be 1.5 with GFR 34 Creatinine peaked at 2.21 on 02/23/2021 with GFR 22. Creatinine is downtrending on IV fluid Patient has refused to eat Called her spouse and daughter,  no answer.  Chronic diastolic congestive heart failure:  As to the echo done on 05/05/2020 revealed LVEF 55% with grade 1 diastolic dysfunction Closely monitor volume status while on IV fluid hydration. Strict I's and O's and daily weight  Severe protein calorie malnutrition: Albumin of 1.6.  Nutrition consulted.  Secondary to malignancy, poor nutrition Patient has refused to eat. Palliative care has been consulted to assist with establishing goals of care. Unable to reach family.   Critical care time: 65 minutes.         DVT prophylaxis: Heparin drip Code Status: DNR Family Communication: Unable to reach family on 02/25/2021. Patient status:Inpatient  Dispo: The patient is from: Home              Anticipated d/c is to: Home possibly on 02/27/21  Consultants: Palliative care team.  Procedures: Right upper extremity Doppler ultrasound 02/25/2021.  Antimicrobials:  Anti-infectives (From admission, onward)    None       Objective: Vitals:   02/24/21 2100 02/25/21 0000 02/25/21 0400 02/25/21 0805  BP:  108/70 (!) 106/59 (!) 94/55  Pulse:  99 73 89  Resp:  17 14 17   Temp: 97.6 F (36.4 C) 98 F (36.7 C)  98 F (36.7 C)  TempSrc: Oral Axillary  Oral  SpO2:   99% 98%  Weight:      Height:        Intake/Output Summary (Last 24 hours) at 02/25/2021 1048 Last data filed at 02/25/2021 3016 Gross per 24 hour  Intake 3192.19 ml  Output --  Net 3192.19 ml   Filed Weights   02/24/21 2000  Weight: 57.8 kg    Examination:  General exam: Frail-appearing no acute distress.  She is alert and oriented x3.   HEENT: PERRL. Respiratory system: Clear to auscultation with no wheezes or rales.  Poor inspiratory effort. Cardiovascular system: Regular rate and rhythm no rubs or gallops.   Gastrointestinal system: Soft nontender normal bowel sounds present.   Central nervous system: Alert and oriented x4.  Moves all 4 extremities.   Extremities: No lower extremity edema  bilaterally. Skin: No rashes or ulcerative lesions noted. Psych: Patient does not participate in the interview, flat affect.    Data Reviewed: I have personally reviewed following labs and imaging studies  CBC: Recent Labs  Lab 02/22/21 1909 02/23/21 0500 02/23/21 1050 02/24/21 0058 02/25/21 0058  WBC 13.4* 16.1* 16.1* 16.5* 14.6*  NEUTROABS 9.8*  --   --  12.1* 10.3*  HGB 3.7* 11.0* 11.5* 12.9 12.5  HCT 15.0* 36.0 37.3 40.0 39.8  MCV 51.2* 70.6* 69.7* 67.7* 67.5*  PLT 338 114* 283 255 010   Basic Metabolic Panel: Recent Labs  Lab 02/22/21 1909 02/23/21 0500 02/24/21 0058 02/25/21 0058  NA 136 138 136 137  K 3.1* 3.9 3.6 3.3*  CL 115* 115* 111 106  CO2 16* 17* 17* 24  GLUCOSE 98 102* 88 56*  BUN 33* 32* 33* 30*  CREATININE 2.17* 2.21* 2.09* 2.02*  CALCIUM 6.8* 7.0* 7.0* 6.7*  MG  --  2.0  --  1.9  PHOS  --   --   --  3.4   GFR: Estimated Creatinine Clearance: 19.8 mL/min (A) (by C-G formula based on SCr  of 2.02 mg/dL (H)). Liver Function Tests: Recent Labs  Lab 02/22/21 1909 02/23/21 0500 02/25/21 0058  AST 23 21 22   ALT 25 28 23   ALKPHOS 75 87 91  BILITOT 0.9 1.8* 1.2  PROT 4.4* 4.6* 4.7*  ALBUMIN <1.5* 1.6* 1.5*   No results for input(s): LIPASE, AMYLASE in the last 168 hours. No results for input(s): AMMONIA in the last 168 hours. Coagulation Profile: No results for input(s): INR, PROTIME in the last 168 hours. Cardiac Enzymes: No results for input(s): CKTOTAL, CKMB, CKMBINDEX, TROPONINI in the last 168 hours. BNP (last 3 results) No results for input(s): PROBNP in the last 8760 hours. HbA1C: No results for input(s): HGBA1C in the last 72 hours. CBG: Recent Labs  Lab 02/25/21 0526 02/25/21 0604  GLUCAP 57* 115*   Lipid Profile: No results for input(s): CHOL, HDL, LDLCALC, TRIG, CHOLHDL, LDLDIRECT in the last 72 hours. Thyroid Function Tests: No results for input(s): TSH, T4TOTAL, FREET4, T3FREE, THYROIDAB in the last 72 hours. Anemia  Panel: Recent Labs    02/23/21 0500  VITAMINB12 2,107*  FOLATE 10.8  TIBC 172*  IRON 131   Sepsis Labs: Recent Labs  Lab 02/22/21 1911 02/22/21 2101  LATICACIDVEN 1.6 1.8    Recent Results (from the past 240 hour(s))  Resp Panel by RT-PCR (Flu A&B, Covid) Nasopharyngeal Swab     Status: None   Collection Time: 02/22/21  7:22 PM   Specimen: Nasopharyngeal Swab; Nasopharyngeal(NP) swabs in vial transport medium  Result Value Ref Range Status   SARS Coronavirus 2 by RT PCR NEGATIVE NEGATIVE Final    Comment: (NOTE) SARS-CoV-2 target nucleic acids are NOT DETECTED.  The SARS-CoV-2 RNA is generally detectable in upper respiratory specimens during the acute phase of infection. The lowest concentration of SARS-CoV-2 viral copies this assay can detect is 138 copies/mL. A negative result does not preclude SARS-Cov-2 infection and should not be used as the sole basis for treatment or other patient management decisions. A negative result may occur with  improper specimen collection/handling, submission of specimen other than nasopharyngeal swab, presence of viral mutation(s) within the areas targeted by this assay, and inadequate number of viral copies(<138 copies/mL). A negative result must be combined with clinical observations, patient history, and epidemiological information. The expected result is Negative.  Fact Sheet for Patients:  EntrepreneurPulse.com.au  Fact Sheet for Healthcare Providers:  IncredibleEmployment.be  This test is no t yet approved or cleared by the [place] FDA and  has been authorized for detection and/or diagnosis of SARS-CoV-2 by FDA under an Emergency Use Authorization (EUA). This EUA will remain  in effect (meaning this test can be used) for the duration of the COVID-19 declaration under Section 564(b)(1) of the Act, 21 U.S.C.section 360bbb-3(b)(1), unless the authorization is terminated  or revoked sooner.        Influenza A by PCR NEGATIVE NEGATIVE Final   Influenza B by PCR NEGATIVE NEGATIVE Final    Comment: (NOTE) The Xpert Xpress SARS-CoV-2/FLU/RSV plus assay is intended as an aid in the diagnosis of influenza from Nasopharyngeal swab specimens and should not be used as a sole basis for treatment. Nasal washings and aspirates are unacceptable for Xpert Xpress SARS-CoV-2/FLU/RSV testing.  Fact Sheet for Patients: EntrepreneurPulse.com.au  Fact Sheet for Healthcare Providers: IncredibleEmployment.be  This test is not yet approved or cleared by the [place] FDA and has been authorized for detection and/or diagnosis of SARS-CoV-2 by FDA under an Emergency Use Authorization (EUA). This EUA will remain in  effect (meaning this test can be used) for the duration of the COVID-19 declaration under Section 564(b)(1) of the Act, 21 U.S.C. section 360bbb-3(b)(1), unless the authorization is terminated or revoked.  Performed at [facility], [place] [address], [place], [place] [address]          Radiology Studies: VAS [place] UPPER EXTREMITY VENOUS DUPLEX  Result Date: 02/25/2021 UPPER VENOUS STUDY  Patient Name:  Shannon Terry  Date of Exam:   02/25/2021 Medical Rec #: [id number]   Accession #:    [id number] Date of Birth: 1940/09/17   Patient Gender: F Patient Age:   73 years Exam Location:  [facility] Procedure:      VAS [place] UPPER EXTREMITY VENOUS DUPLEX Referring Phys: Irene Pap --------------------------------------------------------------------------------  Indications: Edema Risk Factors: Cancer malignant GIST. Comparison Study: No prior studies. Performing Technologist: Darlin Coco RDMS, RVT  Examination Guidelines: A complete evaluation includes B-mode imaging, spectral Doppler, color Doppler, and power Doppler as needed of all accessible portions of each vessel. Bilateral testing is considered an integral part of a complete  examination. Limited examinations for reoccurring indications may be performed as noted.  Right Findings: +----------+------------+---------+-----------+----------+---------------------+  RIGHT      Compressible Phasicity Spontaneous Properties        Summary         +----------+------------+---------+-----------+----------+---------------------+  IJV            Full        Yes        Yes                                       +----------+------------+---------+-----------+----------+---------------------+  Subclavian     None        No         No                         Acute          +----------+------------+---------+-----------+----------+---------------------+  Axillary       None        No         No                         Acute          +----------+------------+---------+-----------+----------+---------------------+  Brachial       None        No         No                         Acute          +----------+------------+---------+-----------+----------+---------------------+  Radial         Full                                                             +----------+------------+---------+-----------+----------+---------------------+  Ulnar          None        No         No  Acute          +----------+------------+---------+-----------+----------+---------------------+  Cephalic       None        No         No                  Continuous flow in                                                                   upper arm,                                                                   non-compressible in                                                                    forearm         +----------+------------+---------+-----------+----------+---------------------+  Basilic        None        No         No                                        +----------+------------+---------+-----------+----------+---------------------+  Left Findings:  +----------+------------+---------+-----------+----------+-------+  LEFT       Compressible Phasicity Spontaneous Properties Summary  +----------+------------+---------+-----------+----------+-------+  Subclavian     Full        Yes        Yes                         +----------+------------+---------+-----------+----------+-------+  Summary:  Right: Findings consistent with extensive, occlusive acute deep vein thrombosis involving the right subclavian vein, right axillary vein, right brachial veins and right ulnar veins. Findings consistent with acute superficial vein thrombosis involving the right basilic vein and right cephalic vein.  Left: No evidence of thrombosis in the subclavian.  *See table(s) above for measurements and observations.     Preliminary         Scheduled Meds:  sodium chloride   Intravenous Once   calcium-vitamin D  1 tablet Oral Q breakfast   Continuous Infusions:  dextrose 5 % and 0.9 % NaCl with KCl 40 mEq/L 50 mL/hr at 02/25/21 0552     LOS: 2 days    Time spent:35 mins. More than 50% of that time was spent in counseling and/or coordination of care.      Kayleen Memos, MD [facility] P12/23/2022, 10:48 AM

## 2021-02-25 NOTE — Progress Notes (Signed)
Upper extremity venous RT study completed.  Preliminary results relayed to [place], DO via phone.  See CV Proc for preliminary results report.   Darlin Coco, RDMS, RVT

## 2021-02-25 NOTE — Progress Notes (Addendum)
ANTICOAGULATION CONSULT NOTE - Follow Up Consult  Pharmacy Consult for Enoxaparin  Indication: DVT Treatment  No Known Allergies  Patient Measurements: Height: 5\' 4"  (162.6 cm) Weight: 57.8 kg (127 lb 6.8 oz) IBW/kg (Calculated) : 54.7  Vital Signs: Temp: 97.8 F (36.6 C) (12/23 1900) Temp Source: Oral (12/23 1900) BP: 97/65 (12/23 1900) Pulse Rate: 87 (12/23 1900)  Labs: Recent Labs    02/23/21 0500 02/23/21 1050 02/24/21 0058 02/25/21 0058  HGB 11.0* 11.5* 12.9 12.5  HCT 36.0 37.3 40.0 39.8  PLT 114* 283 255 215  CREATININE 2.21*  --  2.09* 2.02*    Estimated Creatinine Clearance: 19.8 mL/min (A) (by C-G formula based on SCr of 2.02 mg/dL (H)).   Medical History: Past Medical History:  Diagnosis Date   Acute renal failure (HCC)    Anasarca    Anemia, iron deficiency    chronic blood loss   Gastritis without bleeding    GERD (gastroesophageal reflux disease)    GI bleeding    hx of   Hematochezia    Prior to Diagniosis   Hemorrhoids    History of blood transfusion 04/01/12   Hgb. 2.9/ 6 Units of PRBC   Iron deficiency anemia due to chronic blood loss 04/05/12   Malignant GIST ([place]) 05/30/2012   Obesity    BMI 42 Jan 2014.    Rectal cancer ([place]) 04/03/12   gastrointestinal stromal tumor    Assessment: 80 yr old woman presented to [facility] [place] on 02/22/21 with weakness secondary to acute blood loss anemia in setting of rectal bleed. Source of rectal bleeding is GIST tumor, leading to frequent readmissions. Hgb on admit 3.7, received 3U PRBCs. No frank hematochezia or melena noted. Pt was found to have extensive right upper extremity venous DVT on 12/23. Pharmacy was consulted for IV heparin dosing. Pt refused IV access placement, so cannot receive IV heparin. Spoke with Dr Alcario Drought, who asked pharmacy to dose enoxaparin for VTE treatment in this pt. RN spoke with pt, and she is willing to take SQ enoxaparin injections for VTE treatment.  H/H 12.5/39.8,  platelets 215 (Hgb stable since 12/21, no bleeding noted at this time); Scr 2.02, TBW CrCl ~21 mL/min (renal function stable); given renal dysfunction, will monitor LMWH anti-Xa levels in this pt  Goal of Therapy:  LMWH anti-Xa level: 0.6-1.0 international units/ml (4-hr level) Monitor platelets by anticoagulation protocol: Yes   Plan:  Enoxaparin 1 mg/kg (57.5 mg) SQ Q 24 hrs Monitor LMWH anti-Xa level 4 hrs after 4th dose of enoxaparin Monitor daily CBC, monitor Scr Q 48 hrs  Monitor for bleeding  Gillermina Hu, PharmD, BCPS, [employer] Clinical Pharmacist 02/25/2021 9:02 PM

## 2021-02-25 NOTE — Progress Notes (Signed)
Patient has taken off the monitor and is refusing to have it placed back on her. Education done on need for monitoring.  Dr Alcario Drought was made aware.

## 2021-02-25 NOTE — Progress Notes (Signed)
ANTICOAGULATION CONSULT NOTE - Initial Consult  Pharmacy Consult for Heparin dosing  Indication: DVT  No Known Allergies  Patient Measurements: Height: 5\' 4"  (162.6 cm) Weight: 57.8 kg (127 lb 6.8 oz) IBW/kg (Calculated) : 54.7 Heparin Dosing Weight: 57.8  Vital Signs: Temp: 98 F (36.7 C) (12/23 0805) Temp Source: Oral (12/23 0805) BP: 94/55 (12/23 0805) Pulse Rate: 89 (12/23 0805)  Labs: Recent Labs    02/22/21 1909 02/22/21 2101 02/23/21 0500 02/23/21 1050 02/24/21 0058 02/25/21 0058  HGB 3.7*  --  11.0* 11.5* 12.9 12.5  HCT 15.0*  --  36.0 37.3 40.0 39.8  PLT 338  --  114* 283 255 215  CREATININE 2.17*  --  2.21*  --  2.09* 2.02*  TROPONINIHS 10 10  --   --   --   --     Estimated Creatinine Clearance: 19.8 mL/min (A) (by C-G formula based on SCr of 2.02 mg/dL (H)).   Medical History: Past Medical History:  Diagnosis Date   Acute renal failure (HCC)    Anasarca    Anemia, iron deficiency    chronic blood loss   Gastritis without bleeding    GERD (gastroesophageal reflux disease)    GI bleeding    hx of   Hematochezia    Prior to Diagniosis   Hemorrhoids    History of blood transfusion 04/01/12   Hgb. 2.9/ 6 Units of PRBC   Iron deficiency anemia due to chronic blood loss 04/05/12   Malignant GIST ([place]) 05/30/2012   Obesity    BMI 42 Jan 2014.    Rectal cancer ([place]) 04/03/12   gastrointestinal stromal tumor     Assessment: Pt presented to [facility]/22/21 with weakness secondary to ABLA in setting of rectal bleed. Source of rectal bleeding from GIST tumor, leading to frequent readmissions. Hgb on admit 3.7, received 3U PRBCs. No frank hematochezia or melena noted. Found to have extensive right upper extremity venous DVT on 12/23. Pharmacy has been consulted for heparin dosing.  Hgb stable since 12/21, currently 12.5. PLT WNL. No overt bleeding noted.   Goal of Therapy:  Heparin level 0.3-0.7 units/ml Monitor platelets by anticoagulation protocol: Yes    Plan:  START heparin 1,050 units/hour, no bolus (GIB) Check 8-h HL  Daily HL, CBC, monitor signs and symptoms of bleeding   Adria Dill, PharmD PGY-1 Acute Care Resident  02/25/2021 11:13 AM

## 2021-02-25 NOTE — Progress Notes (Signed)
Patient has been refusing oral food and fluids for extent of admission. Patient encouraged to eat and drink but says no. Labs result glucose of 56.. MD placed many orders to increase her glucose and potassium. Awaiting IVF from pharmacy. Dextrose administered. Cbg 57. Patient alert and oriented and is adamant about not eating or drinking at this time. Will continue to monitor.

## 2021-02-25 NOTE — Progress Notes (Signed)
This RN at bedside for second assess for other VAST RN. Restricted right arm due to DVT. Left arm assessed with ultrasound- unable to use forearm due to location of infiltration, upperarm with small cephalic vein and large brachial vein visualized. While setting up for PIV stick and discussing needs with patient, patient became tearful, and states she does not want to be stuck. Educated patient on need for IV access to receive medications for DVT, and it's importance. Patient still declines IV stick at this time.   Primary RN Gabe informed. IV Team consult discontinued at this time.

## 2021-02-25 NOTE — Progress Notes (Signed)
Called patient's spouse and daughter to provide updates- No answer.  Left voicemail messages.

## 2021-02-26 DIAGNOSIS — Z515 Encounter for palliative care: Secondary | ICD-10-CM | POA: Diagnosis not present

## 2021-02-26 DIAGNOSIS — K922 Gastrointestinal hemorrhage, unspecified: Secondary | ICD-10-CM | POA: Diagnosis not present

## 2021-02-26 LAB — CBC
HCT: 39.7 % (ref 36.0–46.0)
Hemoglobin: 12.8 g/dL (ref 12.0–15.0)
MCH: 22 pg — ABNORMAL LOW (ref 26.0–34.0)
MCHC: 32.2 g/dL (ref 30.0–36.0)
MCV: 68.3 fL — ABNORMAL LOW (ref 80.0–100.0)
Platelets: 197 10*3/uL (ref 150–400)
RBC: 5.81 MIL/uL — ABNORMAL HIGH (ref 3.87–5.11)
WBC: 16.7 10*3/uL — ABNORMAL HIGH (ref 4.0–10.5)
nRBC: 0 % (ref 0.0–0.2)

## 2021-02-26 MED ORDER — SODIUM CHLORIDE 0.9 % IV SOLN: 1.0000 g | INTRAVENOUS | Status: DC

## 2021-02-26 MED ORDER — CEPHALEXIN 500 MG PO CAPS
500.0000 mg | ORAL_CAPSULE | Freq: Two times a day (BID) | ORAL | Status: DC
  Administered 2021-02-26 – 2021-03-01 (×7): 500 mg via ORAL
  Filled 2021-02-26 (×7): qty 1

## 2021-02-26 NOTE — Progress Notes (Signed)
PROGRESS NOTE    Shannon Terry  BWL:[id number] DOB: 07/14/1940 DOA: 02/22/2021 PCP: Patient, No Pcp Per (Inactive)   Brief Narrative:  Patient is a 80 year old female with history of rectal GIST tumor diagnosed in 2014, not following with oncology /not taking any treatment, frequent hospitalization for severe blood loss anemia from lower GI bleed from the tumor, history of CVA, diastolic congestive heart failure, hypertension, CKD stage IIIb who presented here with complaints of generalized weakness, diarrhea.  She was weak, unable to ambulate, lightheaded.  On presentation her hemoglobin was 3.7K.  Patient was transfused with 3 units of PRBCs, hemoglobin now stable with no reported overt bleeding.  She does not want any further work-up for her rectal tumor/anemia and does not have plan to follow-up with her oncologist(Dr Ennever).    Hospital course complicated by medical noncompliance with refusal to eat or take her medications orally.  Also complicated by right upper extremity extensive DVT noted on right upper extremity Doppler ultrasound done on 02/25/2021 for which she was started on heparin drip.   Palliative care team has been consulted to assist with establishing goals of care.  Patient is DNR.  Assessment & Plan:  Acute on chronic blood loss /microcytic anemia from lower GI bleed secondary to rectal GIST tumor:  -Presented with hemoglobin in the range of 3K, transfused 3 units of PRBCs.  Hemoglobin now stable 12 on 02/26/2021 prior to starting heparin drip.  -No overt bleeding -Closely monitor H&H while on Lovenox in the setting of recent acute blood loss.   Rectal GIST tumor:  -Previously following with oncology, Dr. Marin Olp.  Diagnosed in 2016.  Was on imatinib in the past.  Does not follow with her oncologist anymore because she does not want to seek the treatment.   -Appreciate palliative care consult-patient is DNR.  Gram-negative rod UTI: -Urinary culture positive for  gram-negative rods.  Susceptibilities are pending -Patient refused IV Rocephin.  Will start Keflex.  Await final urine culture results.   Newly diagnosed right upper extremity DVT in the setting of recent lower GI bleed -Lovenox as per pharmacy. -closely monitor H&H while on heparin drip  Hypokalemia: Replenished .  Repeat BMP tomorrow a.m.  Hypercalcemia -Corrected calcium for albumin 8.7   Leukocytosis in the setting of malignancy and DVT:  -Patient remained afebrile.  Leukocytosis trended up from 14,000-16,000.   -Urine culture is positive for gram-negative rods.  Started on antibiotics.   -Repeat CBC tomorrow AM.    AKI on CKD 3B, suspect prerenal in the setting of dehydration from poor oral intake -Baseline creatinine appears to be 1.5 with GFR 34 -Creatinine peaked at 2.21 on 02/23/2021 with GFR 22.  Kidney function slightly improving however patient refused IV fluids -Avoid nephrotoxic medication and monitor kidney function closely.  Chronic diastolic congestive heart failure:  -As to the echo done on 05/05/2020 revealed LVEF 55% with grade 1 diastolic dysfunction -Closely monitor volume status while on IV fluid hydration. -Strict I's and O's and daily weight   Severe protein calorie malnutrition: Albumin of 1.6.  Nutrition consulted.  Secondary to malignancy, poor nutrition -Patient has refused to eat.  DVT prophylaxis: Lovenox Code Status: DNR Family Communication: Patient's daughter and husband  present at bedside.  Plan of care discussed with patient in length and he verbalized understanding and agreed with it. Disposition Plan: To be determined  Consultants:  Palliative care  Procedures:  None  Antimicrobials:  Keflex  Status is: Inpatient    Subjective: Patient seen  and examined.  Resting comfortably on the bed.  Patient's daughter and husband at the bedside.  Patient refused IV line, monitor or telemetry.  She is alert and oriented and communicating well.   She denies any complaints.  She continues to have right upper extremity swelling.  She denies any pain.  No fever overnight.  No acute events overnight.  Objective: Vitals:   02/25/21 1606 02/25/21 1900 02/26/21 0019 02/26/21 0751  BP: 108/68 97/65 (!) 85/57 (!) 85/61  Pulse: 80 87 87 71  Resp: 15 17 18    Temp: 97.7 F (36.5 C) 97.8 F (36.6 C) 98.3 F (36.8 C) (!) 97.5 F (36.4 C)  TempSrc: Oral Oral Axillary Axillary  SpO2: 98% 98% 97%   Weight:      Height:        Intake/Output Summary (Last 24 hours) at 02/26/2021 1223 Last data filed at 02/26/2021 1028 Gross per 24 hour  Intake 120 ml  Output --  Net 120 ml   Filed Weights   02/24/21 2000  Weight: 57.8 kg    Examination:  General exam: Appears calm and comfortable, on room air, appears sad Respiratory system: Clear to auscultation. Respiratory effort normal. Cardiovascular system: S1 & S2 heard, RRR. No JVD, murmurs, rubs, gallops or clicks. No pedal edema. Gastrointestinal system: Abdomen is nondistended, soft and nontender. No organomegaly or masses felt. Normal bowel sounds heard. Central nervous system: Alert and oriented. No focal neurological deficits. Extremities: Right upper extremity swelling, erythematous, nontender on palpation. Skin: No rashes, lesions or ulcers Psychiatry: Judgement and insight appear normal.  Flat affect.  Data Reviewed: I have personally reviewed following labs and imaging studies  CBC: Recent Labs  Lab 02/22/21 1909 02/23/21 0500 02/23/21 1050 02/24/21 0058 02/25/21 0058 02/26/21 0251  WBC 13.4* 16.1* 16.1* 16.5* 14.6* 16.7*  NEUTROABS 9.8*  --   --  12.1* 10.3*  --   HGB 3.7* 11.0* 11.5* 12.9 12.5 12.8  HCT 15.0* 36.0 37.3 40.0 39.8 39.7  MCV 51.2* 70.6* 69.7* 67.7* 67.5* 68.3*  PLT 338 114* 283 255 215 413   Basic Metabolic Panel: Recent Labs  Lab 02/22/21 1909 02/23/21 0500 02/24/21 0058 02/25/21 0058  NA 136 138 136 137  K 3.1* 3.9 3.6 3.3*  CL 115* 115*  111 106  CO2 16* 17* 17* 24  GLUCOSE 98 102* 88 56*  BUN 33* 32* 33* 30*  CREATININE 2.17* 2.21* 2.09* 2.02*  CALCIUM 6.8* 7.0* 7.0* 6.7*  MG  --  2.0  --  1.9  PHOS  --   --   --  3.4   GFR: Estimated Creatinine Clearance: 19.8 mL/min (A) (by C-G formula based on SCr of 2.02 mg/dL (H)). Liver Function Tests: Recent Labs  Lab 02/22/21 1909 02/23/21 0500 02/25/21 0058  AST 23 21 22   ALT 25 28 23   ALKPHOS 75 87 91  BILITOT 0.9 1.8* 1.2  PROT 4.4* 4.6* 4.7*  ALBUMIN <1.5* 1.6* 1.5*   No results for input(s): LIPASE, AMYLASE in the last 168 hours. No results for input(s): AMMONIA in the last 168 hours. Coagulation Profile: No results for input(s): INR, PROTIME in the last 168 hours. Cardiac Enzymes: No results for input(s): CKTOTAL, CKMB, CKMBINDEX, TROPONINI in the last 168 hours. BNP (last 3 results) No results for input(s): PROBNP in the last 8760 hours. HbA1C: No results for input(s): HGBA1C in the last 72 hours. CBG: Recent Labs  Lab 02/25/21 0526 02/25/21 0604  GLUCAP 57* 115*   Lipid  Profile: No results for input(s): CHOL, HDL, LDLCALC, TRIG, CHOLHDL, LDLDIRECT in the last 72 hours. Thyroid Function Tests: No results for input(s): TSH, T4TOTAL, FREET4, T3FREE, THYROIDAB in the last 72 hours. Anemia Panel: No results for input(s): VITAMINB12, FOLATE, FERRITIN, TIBC, IRON, RETICCTPCT in the last 72 hours. Sepsis Labs: Recent Labs  Lab 02/22/21 1911 02/22/21 2101  LATICACIDVEN 1.6 1.8    Recent Results (from the past 240 hour(s))  Resp Panel by RT-PCR (Flu A&B, Covid) Nasopharyngeal Swab     Status: None   Collection Time: 02/22/21  7:22 PM   Specimen: Nasopharyngeal Swab; Nasopharyngeal(NP) swabs in vial transport medium  Result Value Ref Range Status   SARS Coronavirus 2 by RT PCR NEGATIVE NEGATIVE Final    Comment: (NOTE) SARS-CoV-2 target nucleic acids are NOT DETECTED.  The SARS-CoV-2 RNA is generally detectable in upper respiratory specimens  during the acute phase of infection. The lowest concentration of SARS-CoV-2 viral copies this assay can detect is 138 copies/mL. A negative result does not preclude SARS-Cov-2 infection and should not be used as the sole basis for treatment or other patient management decisions. A negative result may occur with  improper specimen collection/handling, submission of specimen other than nasopharyngeal swab, presence of viral mutation(s) within the areas targeted by this assay, and inadequate number of viral copies(<138 copies/mL). A negative result must be combined with clinical observations, patient history, and epidemiological information. The expected result is Negative.  Fact Sheet for Patients:  EntrepreneurPulse.com.au  Fact Sheet for Healthcare Providers:  IncredibleEmployment.be  This test is no t yet approved or cleared by the [place] FDA and  has been authorized for detection and/or diagnosis of SARS-CoV-2 by FDA under an Emergency Use Authorization (EUA). This EUA will remain  in effect (meaning this test can be used) for the duration of the COVID-19 declaration under Section 564(b)(1) of the Act, 21 U.S.C.section 360bbb-3(b)(1), unless the authorization is terminated  or revoked sooner.       Influenza A by PCR NEGATIVE NEGATIVE Final   Influenza B by PCR NEGATIVE NEGATIVE Final    Comment: (NOTE) The Xpert Xpress SARS-CoV-2/FLU/RSV plus assay is intended as an aid in the diagnosis of influenza from Nasopharyngeal swab specimens and should not be used as a sole basis for treatment. Nasal washings and aspirates are unacceptable for Xpert Xpress SARS-CoV-2/FLU/RSV testing.  Fact Sheet for Patients: EntrepreneurPulse.com.au  Fact Sheet for Healthcare Providers: IncredibleEmployment.be  This test is not yet approved or cleared by the [place] FDA and has been authorized for detection  and/or diagnosis of SARS-CoV-2 by FDA under an Emergency Use Authorization (EUA). This EUA will remain in effect (meaning this test can be used) for the duration of the COVID-19 declaration under Section 564(b)(1) of the Act, 21 U.S.C. section 360bbb-3(b)(1), unless the authorization is terminated or revoked.  Performed at [facility], [place] [address] [place], [place] [address]   Urine Culture     Status: Abnormal (Preliminary result)   Collection Time: 02/25/21  1:37 AM   Specimen: Urine, Clean Catch  Result Value Ref Range Status   Specimen Description URINE, CLEAN CATCH  Final   Special Requests NONE  Final   Culture (A)  Final    >=100,000 COLONIES/mL KLEBSIELLA PNEUMONIAE SUSCEPTIBILITIES TO FOLLOW Performed at [facility], [place] [address] [place], [place] [address]    Report Status PENDING  Incomplete      Radiology Studies: VAS [place] UPPER EXTREMITY VENOUS DUPLEX  Result  Date: 02/25/2021 UPPER VENOUS STUDY  Patient Name:  Shannon Terry  Date of Exam:   02/25/2021 Medical Rec #: [id number]   Accession #:    [id number] Date of Birth: 1940-11-09   Patient Gender: F Patient Age:   52 years Exam Location:  [facility] Procedure:      VAS [place] UPPER EXTREMITY VENOUS DUPLEX Referring Phys: Irene Pap --------------------------------------------------------------------------------  Indications: Edema Risk Factors: Cancer malignant GIST. Comparison Study: No prior studies. Performing Technologist: Darlin Coco RDMS, RVT  Examination Guidelines: A complete evaluation includes B-mode imaging, spectral Doppler, color Doppler, and power Doppler as needed of all accessible portions of each vessel. Bilateral testing is considered an integral part of a complete examination. Limited examinations for reoccurring indications may be performed as noted.  Right Findings: +----------+------------+---------+-----------+----------+---------------------+  RIGHT       Compressible Phasicity Spontaneous Properties        Summary         +----------+------------+---------+-----------+----------+---------------------+  IJV            Full        Yes        Yes                                       +----------+------------+---------+-----------+----------+---------------------+  Subclavian     None        No         No                         Acute          +----------+------------+---------+-----------+----------+---------------------+  Axillary       None        No         No                         Acute          +----------+------------+---------+-----------+----------+---------------------+  Brachial       None        No         No                         Acute          +----------+------------+---------+-----------+----------+---------------------+  Radial         Full                                                             +----------+------------+---------+-----------+----------+---------------------+  Ulnar          None        No         No                         Acute          +----------+------------+---------+-----------+----------+---------------------+  Cephalic       None        No         No                  Continuous flow in  upper arm,                                                                   non-compressible in                                                                    forearm         +----------+------------+---------+-----------+----------+---------------------+  Basilic        None        No         No                                        +----------+------------+---------+-----------+----------+---------------------+  Left Findings: +----------+------------+---------+-----------+----------+-------+  LEFT       Compressible Phasicity Spontaneous Properties Summary  +----------+------------+---------+-----------+----------+-------+  Subclavian     Full        Yes        Yes                          +----------+------------+---------+-----------+----------+-------+  Summary:  Right: Findings consistent with extensive, occlusive acute deep vein thrombosis involving the right subclavian vein, right axillary vein, right brachial veins and right ulnar veins. Findings consistent with acute superficial vein thrombosis involving the right basilic vein and right cephalic vein.  Left: No evidence of thrombosis in the subclavian.  *See table(s) above for measurements and observations.  Diagnosing physician: Jamelle Haring Electronically signed by Jamelle Haring on 02/25/2021 at 2:43:42 PM.    Final     Scheduled Meds:  sodium chloride   Intravenous Once   calcium-vitamin D  1 tablet Oral Q breakfast   cephALEXin  500 mg Oral Q12H   enoxaparin (LOVENOX) injection  1 mg/kg Subcutaneous Q24H   Continuous Infusions:  dextrose 5 % and 0.9 % NaCl with KCl 40 mEq/L 50 mL/hr at 02/25/21 0552     LOS: 3 days   Time spent: 35 minutes   Aikam Vinje Loann Quill, MD [facility]  If 7PM-7AM, please contact night-coverage [email or web address] 02/26/2021, 12:23 PM

## 2021-02-26 NOTE — Progress Notes (Addendum)
Palliative Medicine Inpatient Follow Up Note   Consulting Provider: Kayleen Memos, DO   Reason for consult:   [place] Palliative Medicine Consult  Reason for Consult? To assist with establishing goals of care.    HPI:  Per hospitalist note --> Patient is a 80 year old female with history of rectal GIST tumor diagnosed in 2014, not following with oncology /not taking any treatment, frequent hospitalization for severe blood loss anemia from lower GI bleed from the tumor, history of CVA, diastolic congestive heart failure, hypertension, CKD stage IIIb who presented here with complaints of generalized weakness, diarrhea.  She was weak, unable to ambulate, lightheaded.  On presentation her hemoglobin was 3.7K.  Patient was transfused with 3 units of PRBCs, hemoglobin now stable with no reported overt bleeding.  She does not want any further work-up for her rectal tumor/anemia and does not have plan to follow-up with her oncologist(Dr Shannon Terry).    Hospital course complicated by medical noncompliance with refusal to eat or take her medications orally.  Also complicated by right upper extremity extensive DVT noted on right upper extremity Doppler ultrasound done on 02/25/2021 for which she was started on heparin drip.   Palliative care team has been consulted to assist with establishing goals of care.  Shannon Terry had been seen in 2017 and at that time transition to home on hospice, she did very well thereafter and graduated from hospice services.  On this admission she does appear to have worsening of her malignant processes as indicated by her thrombosis as well as failure to thrive and generalized weakness.  Today's Discussion (02/26/2021):  *Please note that this is a verbal dictation therefore any spelling or grammatical errors are due to the "[place] [employer]" system interpretation.  Chart reviewed.  I met with Shannon Terry at bedside this afternoon.  She was joined by her  spouse, Shannon Terry and her other daughter Shannon Terry.  We reviewed and's present state in the setting of her malnutrition, DVT of right upper extremity, and adult failure to thrive.  I shared with patient's daughter and husband that this is all suspected to be in the setting of her malignant GIST.  We reviewed how stubborn Shannon Terry is and how she typically does not listen to medical providers.  Shannon Terry was able to participate in a conversation and share her desire to go home.  We reviewed that home is difficult as she is not at a place where she can walk as she could before.  She continues to endorse that she would like to go home which her husband would like to make a reality.  I shared with Shannon Terry that she will likely need hospice when she goes home which she understands.  Shannon Terry clearly states "I do not want to go to hospice".  I shared with her that I understand her desires not to go to a hospice home but her desire is to go to her home with support of hospice services.  Shannon Terry and her family and I reviewed why it is unsafe at this point for her to discharge.  I shared with her that she will need 24/7 supportive care in her home.  Shannon Terry shares that he "takes good care of her".  I reviewed with Shannon Terry that it is clear he takes wonderful care of his wife though she is in a far more debilitated state than she had been prior and I anticipate that will worsen over time.  Shannon Terry very clearly states again that she does  not want to be hospitalized.  We decided that a collaboration among the transition of care team, attending physician, and palliative care team would need to take place so that a safer discharge plan could be established.  Questions and concerns addressed  _________________________________________ Addendum:  I spoke to patient's daughter, and this afternoon.  I called her later as she had worked the night shift and I wanted to verify she got some sleep.  We reviewed that Shannon Terry is not safe to go home  which she agrees with.  I asked her and her opinion is there any way that we can grant her mother's wish of getting her home.  And shares the only way that she can see feasible is to take FMLA so that she does not lose her job and to care for her mother during that time.  The issue with and getting FMLA in the past had been that her mother does not have a PCP and she was unable to have the information filled out appropriately.  We reviewed that there was certainly a way we could verify the paperwork is filled out appropriately hopefully to grant and the FMLA she would need to care for her mother.  And shares if she is able to solidify FMLA she is happy to move in with her mother and have her enrolled in hospice.  She Shannon Terry would be her primary caregiver as she transitions from this journey to the next journey in life.  Per discussion with case management team and Dr. Sallee Terry.  Dr. Royston Terry is willing to complete FMLA paperwork.  This is complicated by the fact that this is a holiday weekend so nothing will likely happen at this time.  Objective Assessment: Vital Signs Vitals:   02/26/21 0751 02/26/21 1240  BP: (!) 85/61 109/75  Pulse: 71 73  Resp:    Temp: (!) 97.5 F (36.4 C) 97.6 F (36.4 C)  SpO2:      Intake/Output Summary (Last 24 hours) at 02/26/2021 1414 Last data filed at 02/26/2021 1028 Gross per 24 hour  Intake 120 ml  Output --  Net 120 ml   Last Weight  Most recent update: 02/25/2021 12:25 AM    Weight  57.8 kg (127 lb 6.8 oz)            Gen: Elderly African-American female in no acute distress HEENT: moist mucous membranes CV: Regular rate and rhythm PULM: On room air ABD: soft/nontender EXT: No edema Neuro: Alert and oriented x3  SUMMARY OF RECOMMENDATIONS   DNAR/DNI   Patient's daughter, Shannon Terry needs FMLA paperwork completed so that she may be her mother's primary caregiver without worrying about losing her job or taking a huge financial hit. Reviewed with Dr.  Doristine Terry who is willing to fill out Shannon Terry's FMLA paperwork  When discharged home Shannon Terry will need to be enrolled in hospice as she does not desire to come back to the hospital nor does she wish to be compliant with medical treatment   Appreciate chaplain support as patient is [facility] and normally attends church on Sundays   Ongoing palliative care support while inpatient  Time Spent: 65 Greater than 50% of the time was spent in counseling and coordination of care ______________________________________________________________________________________ [place] Team Team Cell Phone: [phone] Please utilize secure chat with additional questions, if there is no response within 30 minutes please call the above phone number  Palliative Medicine Team providers are available by phone from 7am to 7pm  daily and can be reached through the team cell phone.  Should this patient require assistance outside of these hours, please call the patient's attending physician.

## 2021-02-26 NOTE — Consult Note (Addendum)
Palliative Medicine Inpatient Consult Note  Consulting Provider: Kayleen Memos, DO  Reason for consult:   [facility] Terry Palliative Medicine Consult  Reason for Consult? To assist with establishing goals of care.   HPI:  Per hospitalist note --> Patient is a 80 year old female with history of rectal GIST tumor diagnosed in 2014, not following with oncology /not taking any treatment, frequent hospitalization for severe blood loss anemia from lower GI bleed from the tumor, history of CVA, diastolic congestive heart failure, hypertension, CKD stage IIIb who presented here with complaints of generalized weakness, diarrhea.  She was weak, unable to ambulate, lightheaded.  On presentation her hemoglobin was 3.7K.  Patient was transfused with 3 units of PRBCs, hemoglobin now stable with no reported overt bleeding.  She does not want any further work-up for her rectal tumor/anemia and does not have plan to follow-up with her oncologist(Shannon Terry).    Hospital course complicated by medical noncompliance with refusal to eat or take her medications orally.  Also complicated by right upper extremity extensive DVT noted on right upper extremity Doppler ultrasound done on 02/25/2021 for which she was started on heparin drip.   Palliative care team has been consulted to assist with establishing goals of care.  Shannon Terry had been seen in 2017 and at that time transition to home on hospice, she did very well thereafter and graduated from hospice services.  On this admission she does appear to have worsening of her malignant processes as indicated by her thrombosis as well as failure to thrive and generalized weakness.  Clinical Assessment/Goals of Care:  *Please note that this is a verbal dictation therefore any spelling or grammatical errors are due to the "[employer] One" system interpretation.  I have reviewed medical records including EPIC notes, labs and imaging, received report from  bedside RN, assessed the patient who is lying in bed in NAD.    I met with Shannon Terry to further discuss diagnosis prognosis, GOC, EOL wishes, disposition and options.  Shannon Terry and I reviewed her past medical history inclusive of a stroke, congestive heart failure, chronic kidney disease, as well as her gastrointestinal stromal tumor (GIST) diagnosed in 2014.   I introduced Palliative Medicine as specialized medical care for people living with serious illness. It focuses on providing relief from the symptoms and stress of a serious illness. The goal is to improve quality of life for both the patient and the family.  Shannon Terry shares with me that she is from [place], [place] originally.  Presently Shannon Terry lives in the [place] area.  She is married and has 3 children, 2 daughters and 1 son.  Shannon Terry has 3 grandchildren.  She formally worked as a Camera operator.  She is a woman of faith and practices within the [employer] denomination.  Shannon Terry lives in her home with her husband.  Up until before Thanksgiving she was able to mobilize and perform activities for herself she was also driving.  As of the past 3 to 4 weeks she cannot get around well and shares with me that her legs are "frozen".  She continues to have mobility complaints and is unable to get up and down easily.  A detailed discussion was had today regarding advanced directives, patient shares that she does not have this though she would rely upon her daughter, and to make decisions for her if she were unable to make decisions for herself.   Concepts specific to code status, artifical feeding and hydration, continued  IV antibiotics and rehospitalization was had.  Shannon Terry is very clear that she prefers not to be hospitalized and would like to go home.  She expresses that she does not want any further treatment for any of her conditions.  She is very firm on being a DO NOT RESUSCITATE/DO NOT INTUBATE CODE STATUS.  The difference between a  aggressive medical intervention path  and a palliative comfort care path for this patient at this time was had.  I reviewed with Shannon Terry that given her ongoing chronic debilitated state and very much compromised nutritional state it might be worthwhile to consider hospice care.  I described hospice as a service for patients for have a life expectancy of < 6 months. It preserves dignity and quality at the end phases of life. The focus changes from curative to symptom relief.  We reviewed that this level of care can be provided in her home and would prohibit her from needing to come back and forth to the hospital.  I shared that this is a very big decision and its worth speaking to her additional family members regarding it.  Shannon Terry gave me consent to call her daughter, Shannon Terry to discuss this further.  At this time though it is clear that Shannon Terry does not wish to be hospitalized anymore.  Discussed the importance of continued conversation with family and their  medical providers regarding overall plan of care and treatment options, ensuring decisions are within the context of the patients values and GOCs. _________________________________ Addendum:  I was able to call patient's daughter, Shannon Terry over the phone this evening.  And shares with me the roller coaster of her mother's health care.  And is very clear that after treatment for Shannon Terry GIST that Shannon Terry had expressed to her family she was tired of the side effects of the treatment and she no longer wanted to receive it.  It was then in 2017 that she stopped treatment for her gastric tumor.  Shannon Terry and I reviewed Shannon Terry is decline over the past few weeks.Shannon Terry shares that is difficult to consider Shannon Terry failing because she has been able to in the past improve so many times before.  We reviewed her graduation from hospice and how multiple medical providers have shared on multiple occasions with Shannon Terry that her mother only has a few months to live though she has exceeded  all of those prognoses'.  I did share with Shannon Terry that it might be worthwhile considering hospice on this hospitalization as all of Ellenie's symptoms do seem to be related to her malignant process.  We reviewed the importance of hearing from the primary medical group further to making additional decisions.  In regards to Nathali's husband, Shannon Terry. Shannon Terry was able to shed light on the reality that he likely has moderate undiagnosed dementia and has a very difficult time understanding the complete context of what is going on with Caelynn's health care.  In terms of Imogean's noncompliance, Shannon Terry shares that this has been ongoing throughout her life.  We reviewed what a strong and stubborn woman Madysyn is.  Discussed that she does not do anything she does not wish to do and she does have her faculties about her.  We left the conversation with the idea that the primary medical team would call and discuss Svara's poor situation with Shannon Terry so that she and her family may help make additional decisions for Flo's care.    Decision Maker: Shannon Terry (Daughter) [phone]  SUMMARY OF RECOMMENDATIONS   DNAR/DNI  Appreciate primary medical team providing patient's family a comprehensive medical update  Broached the topic of hospice given generalized weakness, DVT, and adult failure to thrive in relationship to malignancy  Appreciate chaplain support as patient is [facility] and normally attends church on Sundays  Ongoing palliative care support while inpatient  Code Status/Advance Care Planning: DNAR/DNI   Palliative Prophylaxis:  Oral care, mobility  Additional Recommendations (Limitations, Scope, Preferences): Continue current scope of care  Psycho-social/Spiritual:  Desire for further Chaplaincy support: Yes-[employer] Additional Recommendations: Education on malignant processes   Prognosis: At this time patient absolutely meets criteria for hospice.  She is hypoalbuminemia due to failure to thrive, has  decreased functional score as is very severely frail, & has formed a blood clot in the setting of her malignant processes.  The patient herself does not want active treatment for any of these issues.  Discharge Planning: Discharge home possibly with hospice pending further conversations.  Vitals:   02/25/21 1900 02/26/21 0019  BP: 97/65 (!) 85/57  Pulse: 87 87  Resp: 17 18  Temp: 97.8 F (36.6 C) 98.3 F (36.8 C)  SpO2: 98% 97%    Intake/Output Summary (Last 24 hours) at 02/26/2021 6962 Last data filed at 02/25/2021 9528 Gross per 24 hour  Intake 286.28 ml  Output --  Net 286.28 ml   Last Weight  Most recent update: 02/25/2021 12:25 AM    Weight  57.8 kg (127 lb 6.8 oz)            Gen: Elderly African-American female in no acute distress HEENT: moist mucous membranes CV: Regular rate and rhythm PULM: On room air ABD: soft/nontender EXT: No edema Neuro: Alert and oriented x3  PPS: 30-40%   This conversation/these recommendations were discussed with patient primary care team, Shannon. [place] Crane  Time In: 1610 Time Out: 1730 Total Time: 80 Greater than 50%  of this time was spent counseling and coordinating care related to the above assessment and plan.  [place] Team Team Cell Phone: [phone] Please utilize secure chat with additional questions, if there is no response within 30 minutes please call the above phone number  Palliative Medicine Team providers are available by phone from 7am to 7pm daily and can be reached through the team cell phone.  Should this patient require assistance outside of these hours, please call the patient's attending physician.

## 2021-02-27 DIAGNOSIS — K922 Gastrointestinal hemorrhage, unspecified: Secondary | ICD-10-CM | POA: Diagnosis not present

## 2021-02-27 LAB — COMPREHENSIVE METABOLIC PANEL
ALT: 21 U/L (ref 0–44)
AST: 16 U/L (ref 15–41)
Albumin: <1.5 g/dL — ABNORMAL LOW (ref 3.5–5.0)
Alkaline Phosphatase: 102 U/L (ref 38–126)
Anion gap: 9 (ref 5–15)
BUN: 39 mg/dL — ABNORMAL HIGH (ref 8–23)
CO2: 25 mmol/L (ref 22–32)
Calcium: 7.1 mg/dL — ABNORMAL LOW (ref 8.9–10.3)
Chloride: 103 mmol/L (ref 98–111)
Creatinine, Ser: 2.18 mg/dL — ABNORMAL HIGH (ref 0.44–1.00)
GFR, Estimated: 23 mL/min — ABNORMAL LOW (ref >60)
Glucose, Bld: 86 mg/dL (ref 70–99)
Potassium: 3.9 mmol/L (ref 3.5–5.1)
Sodium: 137 mmol/L (ref 135–145)
Total Bilirubin: 1 mg/dL (ref 0.3–1.2)
Total Protein: 4.4 g/dL — ABNORMAL LOW (ref 6.5–8.1)

## 2021-02-27 LAB — CBC
HCT: 38.8 % (ref 36.0–46.0)
Hemoglobin: 11.5 g/dL — ABNORMAL LOW (ref 12.0–15.0)
MCH: 21.1 pg — ABNORMAL LOW (ref 26.0–34.0)
MCHC: 29.6 g/dL — ABNORMAL LOW (ref 30.0–36.0)
MCV: 71.3 fL — ABNORMAL LOW (ref 80.0–100.0)
Platelets: 180 10*3/uL (ref 150–400)
RBC: 5.44 MIL/uL — ABNORMAL HIGH (ref 3.87–5.11)
WBC: 16.1 10*3/uL — ABNORMAL HIGH (ref 4.0–10.5)
nRBC: 0.1 % (ref 0.0–0.2)

## 2021-02-27 LAB — MAGNESIUM: Magnesium: 1.8 mg/dL (ref 1.7–2.4)

## 2021-02-27 LAB — URINE CULTURE: Culture: >=100000 — AB

## 2021-02-27 MED ORDER — APIXABAN 5 MG PO TABS
10.0000 mg | ORAL_TABLET | Freq: Two times a day (BID) | ORAL | Status: DC
  Administered 2021-02-27 – 2021-03-01 (×4): 10 mg via ORAL
  Filled 2021-02-27 (×4): qty 2

## 2021-02-27 MED ORDER — APIXABAN 5 MG PO TABS: 5.0000 mg | ORAL_TABLET | Freq: Two times a day (BID) | ORAL | Status: DC

## 2021-02-27 MED ORDER — ENSURE ENLIVE PO LIQD: 237.0000 mL | Freq: Two times a day (BID) | ORAL | Status: DC

## 2021-02-27 NOTE — Plan of Care (Signed)

## 2021-02-27 NOTE — Significant Event (Signed)
Per RN: pt refusing tele monitor.  Will DC this.

## 2021-02-27 NOTE — Progress Notes (Addendum)
ANTICOAGULATION CONSULT NOTE - Follow Up Consult  Pharmacy Consult for Enoxaparin  Indication: DVT Treatment  No Known Allergies  Patient Measurements: Height: 5\' 4"  (162.6 cm) Weight: 57.8 kg (127 lb 6.8 oz) IBW/kg (Calculated) : 54.7  Vital Signs: Temp: 97.6 F (36.4 C) (12/25 0730) Temp Source: Oral (12/25 0730) BP: 90/60 (12/25 0730) Pulse Rate: 79 (12/25 0730)  Labs: Recent Labs    02/25/21 0058 02/26/21 0251 02/27/21 0526  HGB 12.5 12.8 11.5*  HCT 39.8 39.7 38.8  PLT 215 197 180  CREATININE 2.02*  --  2.18*    Estimated Creatinine Clearance: 18.4 mL/min (A) (by C-G formula based on SCr of 2.18 mg/dL (H)).   Medical History: Past Medical History:  Diagnosis Date   Acute renal failure (HCC)    Anasarca    Anemia, iron deficiency    chronic blood loss   Gastritis without bleeding    GERD (gastroesophageal reflux disease)    GI bleeding    hx of   Hematochezia    Prior to Diagniosis   Hemorrhoids    History of blood transfusion 04/01/12   Hgb. 2.9/ 6 Units of PRBC   Iron deficiency anemia due to chronic blood loss 04/05/12   Malignant GIST ([facility]) 05/30/2012   Obesity    BMI 42 Jan 2014.    Rectal cancer ([facility]) 04/03/12   gastrointestinal stromal tumor    Assessment: 80 yr old woman presented [facility]21 with weakness secondary to acute blood loss anemia in setting of rectal bleed. Source of rectal bleeding is GIST tumor, leading to frequent readmissions. Hgb on admit 3.7, received 3U PRBCs. No frank hematochezia or melena noted. Pt was found to have extensive right upper extremity venous DVT on 12/23. Pharmacy was consulted for IV heparin dosing. Pt refused IV access placement, so cannot receive IV heparin. Spoke with Dr Alcario Drought, who asked pharmacy to dose enoxaparin for VTE treatment in this pt. RN spoke with pt, and she is willing to take SQ enoxaparin injections for VTE treatment.  CBC stable, no s/sx of bleeding noted. SCr continues  to trend up (2.02 > 2.18). Palliative care note states when patient is discharged, she does not desire to come back to the hospital or be compliant with medical treatment.  Goal of Therapy:  LMWH anti-Xa level: 0.6-1.0 international units/ml (4-hr level) Monitor platelets by anticoagulation protocol: Yes   Plan:  Enoxaparin 1 mg/kg (57.5 mg) SQ Q 24 hrs Monitor LMWH anti-Xa level 4 hrs after 4th dose of enoxaparin Monitor daily CBC, monitor Scr Q 48 hrs  Monitor for bleeding F/up discharge plans   -------------------------------------------------------------------------------------------------------------- 12/25 PM Update:  Pharmacy consulted to switch patient from enoxaparin to apixaban for DVT treatment. Treatment decisions have been complicated by patient refusing medications.   Plan: DC enoxaparin Start apixaban 10 mg BID x 7 days followed by 5 mg BID  Monitor CBC, renal function, and s/sx of bleeding   Joseph Art, Pharm.D. PGY-1 Pharmacy Resident GURKY:[phone] 02/27/2021 7:54 AM

## 2021-02-27 NOTE — Progress Notes (Signed)
Pt refused ordered tele. Pt states that she does not want to wear it. Per day shift nurse she also refused to wear cardiac monitoring during the day. Provider Alcario Drought notified. See tele d/c order.

## 2021-02-27 NOTE — Progress Notes (Signed)
PROGRESS NOTE    Shannon Terry  HUD:[id number] DOB: [birth year] DOA: 02/22/2021 PCP: Patient, No Pcp Per (Inactive)   Brief Narrative:  Patient is a 80 year old female with history of rectal GIST tumor diagnosed in 2014, not following with oncology /not taking any treatment, frequent hospitalization for severe blood loss anemia from lower GI bleed from the tumor, history of CVA, diastolic congestive heart failure, hypertension, CKD stage IIIb who presented here with complaints of generalized weakness, diarrhea.  She was weak, unable to ambulate, lightheaded.  On presentation her hemoglobin was 3.7K.  Patient was transfused with 3 units of PRBCs, hemoglobin now stable with no reported overt bleeding.  She does not want any further work-up for her rectal tumor/anemia and does not have plan to follow-up with her oncologist(Dr Ennever).    Hospital course complicated by medical noncompliance with refusal to eat or take her medications orally.  Also complicated by right upper extremity extensive DVT noted on right upper extremity Doppler ultrasound done on 02/25/2021 for which she was started on heparin drip.   Palliative care team has been consulted to assist with establishing goals of care.  Patient is DNR.  Assessment & Plan:  Acute on chronic blood loss /microcytic anemia from lower GI bleed secondary to rectal GIST tumor:  -Presented with hemoglobin in the range of 3K, transfused 3 units of PRBCs.  Hemoglobin now stable between 11 and 12  -No overt bleeding -Closely monitor H&H while on anticoagulation in the setting of recent acute blood loss.   Rectal GIST tumor:  -Previously following with oncology, Dr. Marin Olp.  Diagnosed in 2016.  Was on imatinib in the past.  Does not follow with her oncologist anymore because she does not want to seek the treatment.   -Appreciate palliative care consult-patient is DNR.  Klebsiella  UTI: -Urinary culture positive f for Klebsiella UTI.  Sensitive to  Keflex -Continue Keflex twice a day for 7 days  Newly diagnosed right upper extremity DVT in the setting of recent lower GI bleed -On Lovenox.  Consult pharmacy to switch to Eliquis today -closely monitor H&H   Hypokalemia: Resolved  Hypocalcemia -Corrected calcium for albumin 9.1   Leukocytosis in the setting of malignancy and DVT:  -Patient remained afebrile.  Leukocytosis trended up from 14,000-16,000.   -Urine culture is positive for gram-negative rods.  Started on antibiotics.    AKI on CKD 3B, suspect prerenal in the setting of dehydration from poor oral intake -Baseline creatinine appears to be 1.5 with GFR 34 -Creatinine peaked at 2.21, GFR 22, slightly improved to 2.18/23.  Patient refusing IV fluids, has decreased p.o. intake as well  -Avoid nephrotoxic medication and monitor kidney function closely.  Chronic diastolic congestive heart failure:  -As to the echo done on 05/05/2020 revealed LVEF 55% with grade 1 diastolic dysfunction -Closely monitor volume  -Strict I's and O's and daily weight   Severe protein calorie malnutrition: Albumin of less than 1.5. -Secondary to malignancy, poor nutrition -Dietitian consulted however patient did not want to discuss nutrition related history with dietitian and declined all nutrition intervention. -Willing to drink Ensure.  Disposition: Patient is not safe to go home as she needs 24/7 supportive care.  she does not want to go to SNF.  Her daughter Webb Silversmith works night shifts  at [employer] and willing to take care of the patient but the only way that can be possible is to take FMLA so that she does not lose her job.  She does not have  PCP.  I am willing to complete FMLA paperwork for her however does not seems to be possible due to holiday weekend.  She will call the office tomorrow and will get the paperwork and inform us.  DVT prophylaxis: Lovenox Code Status: DNR Family Communication: Patient's daughter   present at bedside.  Plan of care  discussed with patient in length and he verbalized understanding and agreed with it. Disposition Plan: To be determined  Consultants:  Palliative care  Procedures:  None  Antimicrobials:  Keflex  Status is: Inpatient    Subjective: Patient seen and examined.  Resting comfortably on the bed.  Does not want to talk.  Daughter at the bedside.  No acute events overnight.  Continues to have right upper extremity swelling and redness.  No fever, chills, no bleeding per rectum.  Objective: Vitals:   02/27/21 0010 02/27/21 0422 02/27/21 0730 02/27/21 1212  BP: 96/66 (!) 85/60 90/60 107/71  Pulse: 83 87 79 83  Resp: 18 18 18 17   Temp: 97.9 F (36.6 C) 98.3 F (36.8 C) 97.6 F (36.4 C) 97.7 F (36.5 C)  TempSrc: Oral Oral Oral Oral  SpO2:   96% 96%  Weight:      Height:       No intake or output data in the 24 hours ending 02/27/21 1241  Filed Weights   02/24/21 2000  Weight: 57.8 kg    Examination:  General exam: Appears calm and comfortable, on room air, appears sad Respiratory system: Clear to auscultation. Respiratory effort normal. Cardiovascular system: S1 & S2 heard, RRR. No JVD, murmurs, rubs, gallops or clicks. No pedal edema. Gastrointestinal system: Abdomen is nondistended, soft and nontender. No organomegaly or masses felt. Normal bowel sounds heard. Central nervous system: Alert and oriented. No focal neurological deficits. Extremities: Right upper extremity swelling, erythematous, some tenderness on palpation  skin: No rashes, lesions or ulcers Psychiatry: Judgement and insight appear normal.  Flat affect.  Data Reviewed: I have personally reviewed following labs and imaging studies  CBC: Recent Labs  Lab 02/22/21 1909 02/23/21 0500 02/23/21 1050 02/24/21 0058 02/25/21 0058 02/26/21 0251 02/27/21 0526  WBC 13.4*   < > 16.1* 16.5* 14.6* 16.7* 16.1*  NEUTROABS 9.8*  --   --  12.1* 10.3*  --   --   HGB 3.7*   < > 11.5* 12.9 12.5 12.8 11.5*  HCT  15.0*   < > 37.3 40.0 39.8 39.7 38.8  MCV 51.2*   < > 69.7* 67.7* 67.5* 68.3* 71.3*  PLT 338   < > 283 255 215 197 180   < > = values in this interval not displayed.    Basic Metabolic Panel: Recent Labs  Lab 02/22/21 1909 02/23/21 0500 02/24/21 0058 02/25/21 0058 02/27/21 0526  NA 136 138 136 137 137  K 3.1* 3.9 3.6 3.3* 3.9  CL 115* 115* 111 106 103  CO2 16* 17* 17* 24 25  GLUCOSE 98 102* 88 56* 86  BUN 33* 32* 33* 30* 39*  CREATININE 2.17* 2.21* 2.09* 2.02* 2.18*  CALCIUM 6.8* 7.0* 7.0* 6.7* 7.1*  MG  --  2.0  --  1.9 1.8  PHOS  --   --   --  3.4  --     GFR: Estimated Creatinine Clearance: 18.4 mL/min (A) (by C-G formula based on SCr of 2.18 mg/dL (H)). Liver Function Tests: Recent Labs  Lab 02/22/21 1909 02/23/21 0500 02/25/21 0058 02/27/21 0526  AST 23 21 22 16   ALT  25 28 23 21   ALKPHOS 75 87 91 102  BILITOT 0.9 1.8* 1.2 1.0  PROT 4.4* 4.6* 4.7* 4.4*  ALBUMIN <1.5* 1.6* 1.5* <1.5*    No results for input(s): LIPASE, AMYLASE in the last 168 hours. No results for input(s): AMMONIA in the last 168 hours. Coagulation Profile: No results for input(s): INR, PROTIME in the last 168 hours. Cardiac Enzymes: No results for input(s): CKTOTAL, CKMB, CKMBINDEX, TROPONINI in the last 168 hours. BNP (last 3 results) No results for input(s): PROBNP in the last 8760 hours. HbA1C: No results for input(s): HGBA1C in the last 72 hours. CBG: Recent Labs  Lab 02/25/21 0526 02/25/21 0604  GLUCAP 57* 115*    Lipid Profile: No results for input(s): CHOL, HDL, LDLCALC, TRIG, CHOLHDL, LDLDIRECT in the last 72 hours. Thyroid Function Tests: No results for input(s): TSH, T4TOTAL, FREET4, T3FREE, THYROIDAB in the last 72 hours. Anemia Panel: No results for input(s): VITAMINB12, FOLATE, FERRITIN, TIBC, IRON, RETICCTPCT in the last 72 hours. Sepsis Labs: Recent Labs  Lab 02/22/21 1911 02/22/21 2101  LATICACIDVEN 1.6 1.8     Recent Results (from the past 240 hour(s))   Resp Panel by RT-PCR (Flu A&B, Covid) Nasopharyngeal Swab     Status: None   Collection Time: 02/22/21  7:22 PM   Specimen: Nasopharyngeal Swab; Nasopharyngeal(NP) swabs in vial transport medium  Result Value Ref Range Status   SARS Coronavirus 2 by RT PCR NEGATIVE NEGATIVE Final    Comment: (NOTE) SARS-CoV-2 target nucleic acids are NOT DETECTED.  The SARS-CoV-2 RNA is generally detectable in upper respiratory specimens during the acute phase of infection. The lowest concentration of SARS-CoV-2 viral copies this assay can detect is 138 copies/mL. A negative result does not preclude SARS-Cov-2 infection and should not be used as the sole basis for treatment or other patient management decisions. A negative result may occur with  improper specimen collection/handling, submission of specimen other than nasopharyngeal swab, presence of viral mutation(s) within the areas targeted by this assay, and inadequate number of viral copies(<138 copies/mL). A negative result must be combined with clinical observations, patient history, and epidemiological information. The expected result is Negative.  Fact Sheet for Patients:  EntrepreneurPulse.com.au  Fact Sheet for Healthcare Providers:  IncredibleEmployment.be  This test is no t yet approved or cleared by the [place] FDA and  has been authorized for detection and/or diagnosis of SARS-CoV-2 by FDA under an Emergency Use Authorization (EUA). This EUA will remain  in effect (meaning this test can be used) for the duration of the COVID-19 declaration under Section 564(b)(1) of the Act, 21 U.S.C.section 360bbb-3(b)(1), unless the authorization is terminated  or revoked sooner.       Influenza A by PCR NEGATIVE NEGATIVE Final   Influenza B by PCR NEGATIVE NEGATIVE Final    Comment: (NOTE) The Xpert Xpress SARS-CoV-2/FLU/RSV plus assay is intended as an aid in the diagnosis of influenza from  Nasopharyngeal swab specimens and should not be used as a sole basis for treatment. Nasal washings and aspirates are unacceptable for Xpert Xpress SARS-CoV-2/FLU/RSV testing.  Fact Sheet for Patients: EntrepreneurPulse.com.au  Fact Sheet for Healthcare Providers: IncredibleEmployment.be  This test is not yet approved or cleared by the [place] FDA and has been authorized for detection and/or diagnosis of SARS-CoV-2 by FDA under an Emergency Use Authorization (EUA). This EUA will remain in effect (meaning this test can be used) for the duration of the COVID-19 declaration under Section 564(b)(1) of the Act, 21 U.S.C.  section 360bbb-3(b)(1), unless the authorization is terminated or revoked.  Performed at [facility], [place] [address] [place], [place] [address]   Urine Culture     Status: Abnormal   Collection Time: 02/25/21  1:37 AM   Specimen: Urine, Clean Catch  Result Value Ref Range Status   Specimen Description URINE, CLEAN CATCH  Final   Special Requests   Final    NONE Performed at [facility], [place] [address], [place], [place] [address]    Culture >=100,000 COLONIES/mL KLEBSIELLA PNEUMONIAE (A)  Final   Report Status 02/27/2021 FINAL  Final   Organism ID, Bacteria KLEBSIELLA PNEUMONIAE (A)  Final      Susceptibility   Klebsiella pneumoniae - MIC*    AMPICILLIN RESISTANT Resistant     CEFAZOLIN <=4 SENSITIVE Sensitive     CEFEPIME <=0.12 SENSITIVE Sensitive     CEFTRIAXONE <=0.25 SENSITIVE Sensitive     CIPROFLOXACIN <=0.25 SENSITIVE Sensitive     GENTAMICIN <=1 SENSITIVE Sensitive     IMIPENEM <=0.25 SENSITIVE Sensitive     NITROFURANTOIN 32 SENSITIVE Sensitive     TRIMETH/SULFA <=20 SENSITIVE Sensitive     AMPICILLIN/SULBACTAM <=2 SENSITIVE Sensitive     PIP/TAZO <=4 SENSITIVE Sensitive     * >=100,000 COLONIES/mL KLEBSIELLA PNEUMONIAE       Radiology Studies: No results found.  Scheduled Meds:  sodium  chloride   Intravenous Once   calcium-vitamin D  1 tablet Oral Q breakfast   cephALEXin  500 mg Oral Q12H   enoxaparin (LOVENOX) injection  1 mg/kg Subcutaneous Q24H   Continuous Infusions:     LOS: 4 days   Time spent: 35 minutes   Pinchus Weckwerth Loann Quill, MD [facility]  If 7PM-7AM, please contact night-coverage [email or web address] 02/27/2021, 12:41 PM

## 2021-02-28 DIAGNOSIS — K922 Gastrointestinal hemorrhage, unspecified: Secondary | ICD-10-CM | POA: Diagnosis not present

## 2021-02-28 DIAGNOSIS — Z515 Encounter for palliative care: Secondary | ICD-10-CM | POA: Diagnosis not present

## 2021-02-28 LAB — CBC
HCT: 41.4 % (ref 36.0–46.0)
Hemoglobin: 12.3 g/dL (ref 12.0–15.0)
MCH: 21.3 pg — ABNORMAL LOW (ref 26.0–34.0)
MCHC: 29.7 g/dL — ABNORMAL LOW (ref 30.0–36.0)
MCV: 71.8 fL — ABNORMAL LOW (ref 80.0–100.0)
Platelets: 173 10*3/uL (ref 150–400)
RBC: 5.77 MIL/uL — ABNORMAL HIGH (ref 3.87–5.11)
WBC: 16.1 10*3/uL — ABNORMAL HIGH (ref 4.0–10.5)
nRBC: 0 % (ref 0.0–0.2)

## 2021-02-28 LAB — BASIC METABOLIC PANEL
Anion gap: 12 (ref 5–15)
BUN: 41 mg/dL — ABNORMAL HIGH (ref 8–23)
CO2: 26 mmol/L (ref 22–32)
Calcium: 7.3 mg/dL — ABNORMAL LOW (ref 8.9–10.3)
Chloride: 101 mmol/L (ref 98–111)
Creatinine, Ser: 2.18 mg/dL — ABNORMAL HIGH (ref 0.44–1.00)
GFR, Estimated: 23 mL/min — ABNORMAL LOW (ref >60)
Glucose, Bld: 69 mg/dL — ABNORMAL LOW (ref 70–99)
Potassium: 3.5 mmol/L (ref 3.5–5.1)
Sodium: 139 mmol/L (ref 135–145)

## 2021-02-28 LAB — GLUCOSE, CAPILLARY
Glucose-Capillary: 102 mg/dL — ABNORMAL HIGH (ref 70–99)
Glucose-Capillary: 112 mg/dL — ABNORMAL HIGH (ref 70–99)
Glucose-Capillary: 88 mg/dL (ref 70–99)
Glucose-Capillary: 98 mg/dL (ref 70–99)

## 2021-02-28 NOTE — Progress Notes (Signed)
[facility]) Hospital Liaison Note  Received request from Transitions of Care Manager Ronaldo Miyamoto, RN, for hospice services at home after discharge. Chart and patient information under review by [facility] physician. Hospice eligibility pending at this time.  Spoke with daughter Webb Silversmith to initiate education related to hospice philosophy, services and team approach to care. Patient/family verbalized understanding of information provided. Per discussion, the plan for discharge is via EMS transport on 12.27.22.  DME needs discussed. Patient has the following equipment in the home: rolling walker and BSC. No current DME needs at this time.  Please send signed and completed DNR home with patient/family. Please provide prescriptions at discharge as needed to ensure ongoing symptom management.   ACC information and contact numbers given to family. Above information shared with [place].   Please do not hesitate to call with any hospice related questions or concerns.   Thank you for the opportunity to participate in this patient's care.   Nadene Rubins, RN, BSN [facility] Liaison [phone]

## 2021-02-28 NOTE — Progress Notes (Addendum)
PROGRESS NOTE    Zuleica Seith  YKD:[id number] DOB: May 24, 1940 DOA: 02/22/2021 PCP: Patient, No Pcp Per (Inactive)   Brief Narrative:  Patient is a 80 year old female with history of rectal GIST tumor diagnosed in 2014, not following with oncology /not taking any treatment, frequent hospitalization for severe blood loss anemia from lower GI bleed from the tumor, history of CVA, diastolic congestive heart failure, hypertension, CKD stage IIIb who presented here with complaints of generalized weakness, diarrhea.  She was weak, unable to ambulate, lightheaded.  On presentation her hemoglobin was 3.7K.  Patient was transfused with 3 units of PRBCs, hemoglobin now stable with no reported overt bleeding.  She does not want any further work-up for her rectal tumor/anemia and does not have plan to follow-up with her oncologist(Dr Ennever).    Hospital course complicated by medical noncompliance with refusal to eat or take her medications orally.  Also complicated by right upper extremity extensive DVT noted on right upper extremity Doppler ultrasound done on 02/25/2021 for which she was started on heparin drip.   Palliative care team has been consulted to assist with establishing goals of care.  Patient is DNR.  Assessment & Plan:  Acute on chronic blood loss /microcytic anemia from lower GI bleed secondary to rectal GIST tumor:  -Presented with hemoglobin in the range of 3K, transfused 3 units of PRBCs.  Hemoglobin now stable between 11 and 12  -No overt bleeding -Closely monitor H&H while on anticoagulation in the setting of recent acute blood loss.   Rectal GIST tumor:  -Previously following with oncology, Dr. Marin Olp.  Diagnosed in 2016.  Was on imatinib in the past.  Does not follow with her oncologist anymore because she does not want to seek the treatment.   -Appreciate palliative care consult-patient is DNR.  Klebsiella  UTI: -Urinary culture positive f for Klebsiella UTI.  Sensitive to  Keflex -Continue Keflex twice a day for 7 days  Newly diagnosed right upper extremity DVT in the setting of recent lower GI bleed -On Eliquis -closely monitor H&H  -Discussed with daughter she would like to continue Eliquis  Hypokalemia: Resolved  Hypocalcemia -Corrected calcium for albumin 9.1   Leukocytosis in the setting of malignancy and DVT:  -Patient remained afebrile.  Leukocytosis 16,000.   -Urine culture is positive for gram-negative rods.  Currently on Keflex  AKI on CKD 3B, suspect prerenal in the setting of dehydration from poor oral intake -Baseline creatinine appears to be 1.5 with GFR 34 -Creatinine peaked at 2.21, GFR 22, slightly improved to 2.18/23.  Patient refusing IV fluids, has decreased p.o. intake as well  -Avoid nephrotoxic medication and monitor kidney function closely.  Chronic diastolic congestive heart failure:  -As to the echo done on 05/05/2020 revealed LVEF 55% with grade 1 diastolic dysfunction -Closely monitor volume  -Strict I's and O's and daily weight   Severe protein calorie malnutrition: Albumin of less than 1.5. -Secondary to malignancy, poor nutrition -Dietitian consulted however patient did not want to discuss nutrition related history with dietitian and declined all nutrition intervention. -She is depressed.  Offered mirtazapine however she refused.  Disposition: Patient is not safe to go home as she needs 24/7 supportive care.  she does not want to go to SNF.  Patient's daughter Anne's FMLA paperwork completed today.  Daughter wishes to take patient home with hospice tomorrow.  She would like to continue Eliquis for right upper extremity DVT.   DVT prophylaxis: Eliquis  code Status: DNR Family Communication: Patient's  daughter   present at bedside.  Plan of care discussed with patient in length and he verbalized understanding and agreed with it. Disposition Plan: Home with home hospice   consultants:  Palliative care  Procedures:   None  Antimicrobials:  Keflex  Status is: Inpatient    Subjective: Patient seen and examined.  Her blood sugar was low in 60s this morning.  She was given juice and her blood sugar improved to 102.  She has been refusing food/Ensure, IV fluids.  Reports that her right arm swelling is better.  No fever, chills, no blood per rectum.  No acute events overnight.     Objective: Vitals:   02/28/21 0025 02/28/21 0432 02/28/21 0749 02/28/21 1228  BP: 92/61 (!) 90/50 (!) 94/59 (!) 109/96  Pulse: 89 85 85 88  Resp: 20 18 19 18   Temp: 98 F (36.7 C) 98.1 F (36.7 C) 98 F (36.7 C) 97.7 F (36.5 C)  TempSrc: Oral Oral Oral Oral  SpO2: 99% 97% 98% 99%  Weight:      Height:       No intake or output data in the 24 hours ending 02/28/21 1444  Filed Weights   02/24/21 2000  Weight: 57.8 kg    Examination:  General exam: Appears calm and comfortable, very thin and lean/cachectic looking, on room air, has flat affect Respiratory system: Clear to auscultation. Respiratory effort normal. Cardiovascular system: S1 & S2 heard, RRR. No JVD, murmurs, rubs, gallops or clicks. No pedal edema. Gastrointestinal system: Abdomen is nondistended, soft and nontender. No organomegaly or masses felt. Normal bowel sounds heard. Central nervous system: Alert and oriented. No focal neurological deficits. Extremities: Right upper extremity swelling, erythematous, some tenderness on palpation  skin: No rashes, lesions or ulcers  Data Reviewed: I have personally reviewed following labs and imaging studies  CBC: Recent Labs  Lab 02/22/21 1909 02/23/21 0500 02/24/21 0058 02/25/21 0058 02/26/21 0251 02/27/21 0526 02/28/21 0133  WBC 13.4*   < > 16.5* 14.6* 16.7* 16.1* 16.1*  NEUTROABS 9.8*  --  12.1* 10.3*  --   --   --   HGB 3.7*   < > 12.9 12.5 12.8 11.5* 12.3  HCT 15.0*   < > 40.0 39.8 39.7 38.8 41.4  MCV 51.2*   < > 67.7* 67.5* 68.3* 71.3* 71.8*  PLT 338   < > 255 215 197 180 173   < > =  values in this interval not displayed.    Basic Metabolic Panel: Recent Labs  Lab 02/23/21 0500 02/24/21 0058 02/25/21 0058 02/27/21 0526 02/28/21 0133  NA 138 136 137 137 139  K 3.9 3.6 3.3* 3.9 3.5  CL 115* 111 106 103 101  CO2 17* 17* 24 25 26   GLUCOSE 102* 88 56* 86 69*  BUN 32* 33* 30* 39* 41*  CREATININE 2.21* 2.09* 2.02* 2.18* 2.18*  CALCIUM 7.0* 7.0* 6.7* 7.1* 7.3*  MG 2.0  --  1.9 1.8  --   PHOS  --   --  3.4  --   --     GFR: Estimated Creatinine Clearance: 18.4 mL/min (A) (by C-G formula based on SCr of 2.18 mg/dL (H)). Liver Function Tests: Recent Labs  Lab 02/22/21 1909 02/23/21 0500 02/25/21 0058 02/27/21 0526  AST 23 21 22 16   ALT 25 28 23 21   ALKPHOS 75 87 91 102  BILITOT 0.9 1.8* 1.2 1.0  PROT 4.4* 4.6* 4.7* 4.4*  ALBUMIN <1.5* 1.6* 1.5* <1.5*  No results for input(s): LIPASE, AMYLASE in the last 168 hours. No results for input(s): AMMONIA in the last 168 hours. Coagulation Profile: No results for input(s): INR, PROTIME in the last 168 hours. Cardiac Enzymes: No results for input(s): CKTOTAL, CKMB, CKMBINDEX, TROPONINI in the last 168 hours. BNP (last 3 results) No results for input(s): PROBNP in the last 8760 hours. HbA1C: No results for input(s): HGBA1C in the last 72 hours. CBG: Recent Labs  Lab 02/25/21 0526 02/25/21 0604 02/28/21 0812 02/28/21 1227  GLUCAP 57* 115* 102* 112*    Lipid Profile: No results for input(s): CHOL, HDL, LDLCALC, TRIG, CHOLHDL, LDLDIRECT in the last 72 hours. Thyroid Function Tests: No results for input(s): TSH, T4TOTAL, FREET4, T3FREE, THYROIDAB in the last 72 hours. Anemia Panel: No results for input(s): VITAMINB12, FOLATE, FERRITIN, TIBC, IRON, RETICCTPCT in the last 72 hours. Sepsis Labs: Recent Labs  Lab 02/22/21 1911 02/22/21 2101  LATICACIDVEN 1.6 1.8     Recent Results (from the past 240 hour(s))  Resp Panel by RT-PCR (Flu A&B, Covid) Nasopharyngeal Swab     Status: None   Collection  Time: 02/22/21  7:22 PM   Specimen: Nasopharyngeal Swab; Nasopharyngeal(NP) swabs in vial transport medium  Result Value Ref Range Status   SARS Coronavirus 2 by RT PCR NEGATIVE NEGATIVE Final    Comment: (NOTE) SARS-CoV-2 target nucleic acids are NOT DETECTED.  The SARS-CoV-2 RNA is generally detectable in upper respiratory specimens during the acute phase of infection. The lowest concentration of SARS-CoV-2 viral copies this assay can detect is 138 copies/mL. A negative result does not preclude SARS-Cov-2 infection and should not be used as the sole basis for treatment or other patient management decisions. A negative result may occur with  improper specimen collection/handling, submission of specimen other than nasopharyngeal swab, presence of viral mutation(s) within the areas targeted by this assay, and inadequate number of viral copies(<138 copies/mL). A negative result must be combined with clinical observations, patient history, and epidemiological information. The expected result is Negative.  Fact Sheet for Patients:  EntrepreneurPulse.com.au  Fact Sheet for Healthcare Providers:  IncredibleEmployment.be  This test is no t yet approved or cleared by the [place] FDA and  has been authorized for detection and/or diagnosis of SARS-CoV-2 by FDA under an Emergency Use Authorization (EUA). This EUA will remain  in effect (meaning this test can be used) for the duration of the COVID-19 declaration under Section 564(b)(1) of the Act, 21 U.S.C.section 360bbb-3(b)(1), unless the authorization is terminated  or revoked sooner.       Influenza A by PCR NEGATIVE NEGATIVE Final   Influenza B by PCR NEGATIVE NEGATIVE Final    Comment: (NOTE) The Xpert Xpress SARS-CoV-2/FLU/RSV plus assay is intended as an aid in the diagnosis of influenza from Nasopharyngeal swab specimens and should not be used as a sole basis for treatment. Nasal washings  and aspirates are unacceptable for Xpert Xpress SARS-CoV-2/FLU/RSV testing.  Fact Sheet for Patients: EntrepreneurPulse.com.au  Fact Sheet for Healthcare Providers: IncredibleEmployment.be  This test is not yet approved or cleared by the [place] FDA and has been authorized for detection and/or diagnosis of SARS-CoV-2 by FDA under an Emergency Use Authorization (EUA). This EUA will remain in effect (meaning this test can be used) for the duration of the COVID-19 declaration under Section 564(b)(1) of the Act, 21 U.S.C. section 360bbb-3(b)(1), unless the authorization is terminated or revoked.  Performed at [facility], [place] [address] [place], [place] [address]  Urine Culture     Status: Abnormal   Collection Time: 02/25/21  1:37 AM   Specimen: Urine, Clean Catch  Result Value Ref Range Status   Specimen Description URINE, CLEAN CATCH  Final   Special Requests   Final    NONE Performed at [facility], [address] [place], [place] [address]    Culture >=100,000 COLONIES/mL KLEBSIELLA PNEUMONIAE (A)  Final   Report Status 02/27/2021 FINAL  Final   Organism ID, Bacteria KLEBSIELLA PNEUMONIAE (A)  Final      Susceptibility   Klebsiella pneumoniae - MIC*    AMPICILLIN RESISTANT Resistant     CEFAZOLIN <=4 SENSITIVE Sensitive     CEFEPIME <=0.12 SENSITIVE Sensitive     CEFTRIAXONE <=0.25 SENSITIVE Sensitive     CIPROFLOXACIN <=0.25 SENSITIVE Sensitive     GENTAMICIN <=1 SENSITIVE Sensitive     IMIPENEM <=0.25 SENSITIVE Sensitive     NITROFURANTOIN 32 SENSITIVE Sensitive     TRIMETH/SULFA <=20 SENSITIVE Sensitive     AMPICILLIN/SULBACTAM <=2 SENSITIVE Sensitive     PIP/TAZO <=4 SENSITIVE Sensitive     * >=100,000 COLONIES/mL KLEBSIELLA PNEUMONIAE       Radiology Studies: No results found.  Scheduled Meds:  sodium chloride   Intravenous Once   apixaban  10 mg Oral BID   Followed by   Derrill Memo ON 03/06/2021]  apixaban  5 mg Oral BID   calcium-vitamin D  1 tablet Oral Q breakfast   cephALEXin  500 mg Oral Q12H   feeding supplement  237 mL Oral BID BM   Continuous Infusions:     LOS: 5 days   Time spent: 45 minutes   Colen Eltzroth Loann Quill, MD [facility]  If 7PM-7AM, please contact night-coverage [email or web address] 02/28/2021, 2:44 PM

## 2021-02-28 NOTE — Progress Notes (Signed)
Palliative Medicine Inpatient Follow Up Note   Consulting Provider: Kayleen Memos, DO   Reason for consult:   [place] Palliative Medicine Consult  Reason for Consult? To assist with establishing goals of care.    HPI:  Per hospitalist note --> Patient is a 80 year old female with history of rectal GIST tumor diagnosed in 2014, not following with oncology /not taking any treatment, frequent hospitalization for severe blood loss anemia from lower GI bleed from the tumor, history of CVA, diastolic congestive heart failure, hypertension, CKD stage IIIb who presented here with complaints of generalized weakness, diarrhea.  She was weak, unable to ambulate, lightheaded.  On presentation her hemoglobin was 3.7K.  Patient was transfused with 3 units of PRBCs, hemoglobin now stable with no reported overt bleeding.  She does not want any further work-up for her rectal tumor/anemia and does not have plan to follow-up with her oncologist(Dr Ennever).    Hospital course complicated by medical noncompliance with refusal to eat or take her medications orally.  Also complicated by right upper extremity extensive DVT noted on right upper extremity Doppler ultrasound done on 02/25/2021 for which she was started on heparin drip.   Palliative care team has been consulted to assist with establishing goals of care.  [place] had been seen in 2017 and at that time transition to home on hospice, she did very well thereafter and graduated from hospice services.  On this admission she does appear to have worsening of her malignant processes as indicated by her thrombosis as well as failure to thrive and generalized weakness.  Today's Discussion (02/28/2021):  *Please note that this is a verbal dictation therefore any spelling or grammatical errors are due to the "[place]" system interpretation.  Chart reviewed.  I met with Areliz at bedside this morning.  She shared with me the hope  to leave the hospital soon.  We discussed that her daughter has family medical leave paperwork which is going to be completed by Dr. Doristine Bosworth and once approved she will be able to discharge.  Per medical team plan for [place] to discharge home with hospice as early as tomorrow if everything is arranged accordingly.  Palliative support provided as patient again endorses not wishing to be in the hospital any longer.  Objective Assessment: Vital Signs Vitals:   02/28/21 1228 02/28/21 1610  BP: (!) 109/96 102/69  Pulse: 88 86  Resp: 18   Temp: 97.7 F (36.5 C) (!) 97.3 F (36.3 C)  SpO2: 99% 100%   No intake or output data in the 24 hours ending 02/28/21 1654  Last Weight  Most recent update: 02/25/2021 12:25 AM    Weight  57.8 kg (127 lb 6.8 oz)            Gen: Elderly African-American female in no acute distress HEENT: moist mucous membranes CV: Regular rate and rhythm PULM: On room air ABD: soft/nontender EXT: No edema Neuro: Alert and oriented x3  SUMMARY OF RECOMMENDATIONS   DNAR/DNI  Dr. Doristine Bosworth completed FMLA paperwork for patient's daughter Webb Silversmith  Patient will need to be set up with outpatient hospice through [place] --> verified with liaison that inquiry was sent   Plan for possible discharge as soon as hospice is arranged  Time Spent: 25 Greater than 50% of the time was spent in counseling and coordination of care ______________________________________________________________________________________ [place] Team Team Cell Phone: [phone] Please utilize secure chat with additional questions, if  there is no response within 30 minutes please call the above phone number  Palliative Medicine Team providers are available by phone from 7am to 7pm daily and can be reached through the team cell phone.  Should this patient require assistance outside of these hours, please call the patient's attending physician.

## 2021-02-28 NOTE — Progress Notes (Signed)
PT Cancellation Note  Patient Details Name: Gerilyn Stargell MRN: [id number] DOB: 02/20/1941   Cancelled Treatment:    Reason Eval/Treat Not Completed: (P) Patient declined, no reason specified Pt reports she is not interested in any therapy today. PT will follow back tomorrow.  Lamyah Creed B. Migdalia Dk PT, DPT Acute Rehabilitation Services Pager [phone] Office [phone]   [place] 02/28/2021, 1:13 PM

## 2021-02-28 NOTE — Care Management Important Message (Signed)
Important Message  Patient Details  Name: Shannon Terry MRN: [id number] Date of Birth: 05-13-1940   Medicare Important Message Given:  Yes     Hannah Beat 02/28/2021, 1:07 PM

## 2021-02-28 NOTE — TOC Progression Note (Signed)
Transition of Care [facility]) - Progression Note    Patient Details  Name: Shannon Terry MRN: [id number] Date of Birth: 10-18-1940  Transition of Care [facility]) CM/SW [place], RN Phone Number: 02/28/2021, 3:14 PM  Clinical Narrative:    CM referral for home hospice. Choice offered to daughter , Webb Silversmith. [facility] notified of referral. Farrel Gordon will call daughter today. MD plans to send patient home with hospice tomorrow. Patient will need PTAR transportation home   Expected Discharge Plan: Home w Hospice Care Barriers to Discharge: Continued Medical Work up  Expected Discharge Plan and Services Expected Discharge Plan: [place]   Discharge Planning Services: CM Consult Post Acute Care Choice: Hospice Living arrangements for the past 2 months: [place]: Hospice and [place] Date [place]: 02/28/21 Time [place]: [id number] Representative spoke with at [place]: [place] Determinants of Health (SDOH) Interventions    Readmission Risk Interventions No flowsheet data found.

## 2021-02-28 NOTE — Plan of Care (Signed)

## 2021-02-28 NOTE — TOC Progression Note (Signed)
Transition of Care [facility]) - Progression Note    Patient Details  Name: Tomeko Scoville MRN: [id number] Date of Birth: 1940/10/12  Transition of Care [facility]) CM/SW [place], [place] Phone Number: 02/28/2021, 12:34 PM  Clinical Narrative:    CSW was updated by NP that a barrier to DC is FMLA paperwork needing to be confirmed so pt's daughter can care for her mother. MD noted she would sign paperwork. Per NP, daughter aware that she needs to fill out paperwork and have it signed. It is then the employers responsibility to confirm or deny the request. NP asks that [facility] continue to follow up with daughter to ensure that this is completed. TOC will continue to follow for DC needs.        Expected Discharge Plan and Services                                                 Social Determinants of Health (SDOH) Interventions    Readmission Risk Interventions No flowsheet data found.

## 2021-03-01 ENCOUNTER — Other Ambulatory Visit (HOSPITAL_COMMUNITY): Payer: Self-pay

## 2021-03-01 DIAGNOSIS — K922 Gastrointestinal hemorrhage, unspecified: Secondary | ICD-10-CM | POA: Diagnosis not present

## 2021-03-01 LAB — CBC
HCT: 39.4 % (ref 36.0–46.0)
Hemoglobin: 12.1 g/dL (ref 12.0–15.0)
MCH: 21.8 pg — ABNORMAL LOW (ref 26.0–34.0)
MCHC: 30.7 g/dL (ref 30.0–36.0)
MCV: 71 fL — ABNORMAL LOW (ref 80.0–100.0)
Platelets: 184 10*3/uL (ref 150–400)
RBC: 5.55 MIL/uL — ABNORMAL HIGH (ref 3.87–5.11)
WBC: 14 10*3/uL — ABNORMAL HIGH (ref 4.0–10.5)
nRBC: 0 % (ref 0.0–0.2)

## 2021-03-01 LAB — GLUCOSE, CAPILLARY
Glucose-Capillary: 82 mg/dL (ref 70–99)
Glucose-Capillary: 72 mg/dL (ref 70–99)
Glucose-Capillary: 95 mg/dL (ref 70–99)
Glucose-Capillary: 73 mg/dL (ref 70–99)

## 2021-03-01 LAB — PATHOLOGIST SMEAR REVIEW

## 2021-03-01 MED ORDER — ENSURE ENLIVE PO LIQD
237.0000 mL | Freq: Two times a day (BID) | ORAL | 0 refills | Status: AC
Start: 2021-03-01 — End: 2021-03-08

## 2021-03-01 MED ORDER — APIXABAN (ELIQUIS) VTE STARTER PACK (10MG AND 5MG): ORAL_TABLET | ORAL | 0 refills | Status: DC

## 2021-03-01 MED ORDER — OXYCODONE HCL 5 MG PO TABS: 5.0000 mg | ORAL_TABLET | Freq: Four times a day (QID) | ORAL | 0 refills | Status: AC | PRN

## 2021-03-01 MED ORDER — LORAZEPAM 0.5 MG PO TABS: 0.5000 mg | ORAL_TABLET | Freq: Three times a day (TID) | ORAL | 0 refills | Status: AC | PRN

## 2021-03-01 MED ORDER — COLCHICINE 0.3 MG HALF TABLET: 0.3000 mg | ORAL_TABLET | Freq: Every day | ORAL | Status: DC

## 2021-03-01 MED ORDER — COLCHICINE 0.6 MG PO TABS: 0.3000 mg | ORAL_TABLET | Freq: Every day | ORAL | 0 refills | Status: AC

## 2021-03-01 MED ORDER — COLCHICINE 0.6 MG PO TABS
0.6000 mg | ORAL_TABLET | ORAL | Status: AC
  Administered 2021-03-01: 12:00:00 0.6 mg via ORAL
  Filled 2021-03-01: qty 1

## 2021-03-01 MED ORDER — POLYETHYLENE GLYCOL 3350 17 G PO PACK
17.0000 g | PACK | Freq: Every day | ORAL | 0 refills | Status: DC | PRN
Start: 2021-03-01 — End: 2023-06-01

## 2021-03-01 MED ORDER — CEPHALEXIN 500 MG PO CAPS: 500.0000 mg | ORAL_CAPSULE | Freq: Two times a day (BID) | ORAL | 0 refills | Status: AC

## 2021-03-01 NOTE — Progress Notes (Signed)
Occupational Therapy Treatment Patient Details Name: Shannon Terry MRN: [id number] DOB: September 29, 1940 Today's Date: 03/01/2021   History of present illness 80 yo admitted 12/20 with weakness and diarrhea. Hgb 3.7 on admission with 3 units PRBC. +RUE DVT; +left foot/ankle tenderness PMhx: anemia, rectal CA, CVA, CHF, HTN, CKD   OT comments  Patient willing to participate with therapy but was limited by severe pain at LLE foot and ankle. Patient performed AAROM for BUE shoulders and AROM for BUE elbow flexion and extension. Patient was assisted to EOB with +2 due to pain and had limited sitting tolerance due to pain and ws returned to supine.  Patient is expected to return home under hospice.    Recommendations for follow up therapy are one component of a multi-disciplinary discharge planning process, led by the attending physician.  Recommendations may be updated based on patient status, additional functional criteria and insurance authorization.    Follow Up Recommendations  Other (comment) (home with hospice)    Assistance Recommended at Discharge Frequent or constant Supervision/Assistance  Equipment Recommendations  Hospital bed    Recommendations for Other Services      Precautions / Restrictions Precautions Precautions: Fall Restrictions Weight Bearing Restrictions: No       Mobility Bed Mobility Overal bed mobility: Needs Assistance Bed Mobility: Supine to Sit;Sit to Supine     Supine to sit: Max assist;+2 for physical assistance;HOB elevated Sit to supine: Max assist;+2 for physical assistance   General bed mobility comments: pt required assist to pivot to EOB, raising torso and moving LLE due to pain; pt using UEs and rail as able to assist    Transfers                   General transfer comment: deferred due to severe pain Lt foot     Balance Overall balance assessment: Needs assistance Sitting-balance support: Bilateral upper extremity supported;Feet  unsupported Sitting balance-Leahy Scale: Poor                                     ADL either performed or assessed with clinical judgement   ADL                                              Extremity/Trunk Assessment Upper Extremity Assessment RUE Deficits / Details: R UE edema            Vision       Perception     Praxis      Cognition Arousal/Alertness: Awake/alert Behavior During Therapy: WFL for tasks assessed/performed Overall Cognitive Status: Within Functional Limits for tasks assessed                                 General Comments: oriented to self, willing to participate with therapy but had little tolerance due to pain at LLE          Exercises Exercises: General Upper Extremity General Exercises - Upper Extremity Shoulder Flexion: AAROM;Both;10 reps;Supine Shoulder Extension: AAROM;Both;10 reps;Supine Elbow Flexion: AROM;Both;10 reps;Supine Elbow Extension: AROM;Both;10 reps;Supine   Shoulder Instructions       General Comments      Pertinent Vitals/ Pain       Pain Assessment: Faces  Faces Pain Scale: Hurts worst Pain Location: left foot/ankle Pain Descriptors / Indicators: Sore;Tender Pain Intervention(s): Limited activity within patient's tolerance;Monitored during session  Home Living                                          Prior Functioning/Environment              Frequency  Min 2X/week        Progress Toward Goals  OT Goals(current goals can now be found in the care plan section)  Progress towards OT goals: Not progressing toward goals - comment (due to pain at LLE)  Acute Rehab OT Goals Patient Stated Goal: get better OT Goal Formulation: With patient Time For Goal Achievement: 03/10/21 Potential to Achieve Goals: Fair ADL Goals Pt Will Perform Grooming: with set-up;with supervision;sitting Pt Will Perform Lower Body Dressing: with min  assist;sitting/lateral leans;sit to/from stand Pt Will Transfer to Toilet: with mod assist;stand pivot transfer;bedside commode Additional ADL Goal #1: Pt will demonstrate emergent awareness during ADL routine.  Plan Discharge plan remains appropriate    Co-evaluation    PT/OT/SLP Co-Evaluation/Treatment: Yes Reason for Co-Treatment: Complexity of the patient's impairments (multi-system involvement);For patient/therapist safety   OT goals addressed during session: Strengthening/ROM      AM-PAC OT "6 Clicks" Daily Activity     Outcome Measure   Help from another person eating meals?: A Little Help from another person taking care of personal grooming?: A Little Help from another person toileting, which includes using toliet, bedpan, or urinal?: Total Help from another person bathing (including washing, rinsing, drying)?: A Lot Help from another person to put on and taking off regular upper body clothing?: A Little Help from another person to put on and taking off regular lower body clothing?: Total 6 Click Score: 13    End of Session    OT Visit Diagnosis: Other abnormalities of gait and mobility (R26.89);Muscle weakness (generalized) (M62.81);Other symptoms and signs involving cognitive function   Activity Tolerance Patient limited by pain   Patient Left in bed;with call bell/phone within reach;with bed alarm set;with family/visitor present   Nurse Communication Mobility status        Time: 3646-8032 OT Time Calculation (min): 19 min  Charges: OT General Charges $OT Visit: 1 Visit OT Treatments $Therapeutic Exercise: 8-22 mins  Lodema Hong, [place]  Pager [phone] Office [place] 03/01/2021, 10:49 AM

## 2021-03-01 NOTE — Progress Notes (Signed)
Physical Therapy Treatment Patient Details Name: Shannon Terry MRN: [id number] DOB: [birth year] Today's Date: 03/01/2021   History of Present Illness 80 yo admitted 12/20 with weakness and diarrhea. Hgb 3.7 on admission with 3 units PRBC. +RUE DVT; +left foot/ankle tenderness PMhx: anemia, rectal CA, CVA, CHF, HTN, CKD    PT Comments    Patient willing to work with therapies and even sit on EOB. Session limited by severe pain in left foot/ankle--tender to touch with no redness or edema noted. Pt denies h/o gout. Reports pain began since she came to hospital. Secure chat to Dr. Nevada Crane to make her aware.     Recommendations for follow up therapy are one component of a multi-disciplinary discharge planning process, led by the attending physician.  Recommendations may be updated based on patient status, additional functional criteria and insurance authorization.  Follow Up Recommendations  Other (comment) (home with hospice with no f/u PT)     Assistance Recommended at Discharge Frequent or [facility] bed (family reports they do not have room for hospital bed)    Recommendations for Other Services       Precautions / Restrictions Precautions Precautions: Fall     Mobility  Bed Mobility Overal bed mobility: Needs Assistance Bed Mobility: Supine to Sit;Sit to Supine     Supine to sit: Max assist;+2 for physical assistance;HOB elevated Sit to supine: Max assist;+2 for physical assistance   General bed mobility comments: pt required assist to pivot to EOB, raising torso and moving LLE due to pain; pt using UEs and rail as able to assist    Transfers                   General transfer comment: deferred due to severe pain Lt foot    Ambulation/Gait                   Stairs             Wheelchair Mobility    Modified Rankin (Stroke Patients Only)       Balance Overall balance assessment: Needs  assistance Sitting-balance support: Bilateral upper extremity supported;Feet unsupported Sitting balance-Leahy Scale: Poor                                      Cognition Arousal/Alertness: Awake/alert Behavior During Therapy: WFL for tasks assessed/performed Overall Cognitive Status: Within Functional Limits for tasks assessed                                          Exercises General Exercises - Lower Extremity Ankle Circles/Pumps: AROM;Right;5 reps (could not tolerate on left due to pain) Heel Slides: AAROM;Right;5 reps Straight Leg Raises: AAROM;Right;5 reps    General Comments        Pertinent Vitals/Pain Pain Assessment: Faces Faces Pain Scale: Hurts worst Pain Location: left foot/ankle Pain Descriptors / Indicators: Sore;Tender Pain Intervention(s): Limited activity within patient's tolerance;Monitored during session    Home Living                          Prior Function            PT Goals (current goals can now be found in the care plan section) Acute Rehab PT  Goals Patient Stated Goal: return home Time For Goal Achievement: 03/10/21 Potential to Achieve Goals: Fair Progress towards PT goals: Progressing toward goals    Frequency           PT Plan Discharge plan needs to be updated;Frequency needs to be updated    Co-evaluation              AM-PAC PT "6 Clicks" Mobility   Outcome Measure  Help needed turning from your back to your side while in a flat bed without using bedrails?: A Little Help needed moving from lying on your back to sitting on the side of a flat bed without using bedrails?: Total Help needed moving to and from a bed to a chair (including a wheelchair)?: Total Help needed standing up from a chair using your arms (e.g., wheelchair or bedside chair)?: Total Help needed to walk in hospital room?: Total Help needed climbing 3-5 steps with a railing? : Total 6 Click Score: 8    End  of Session   Activity Tolerance: Patient limited by fatigue;Patient limited by pain Patient left: in bed;with call bell/phone within reach;with bed alarm set;with family/visitor present Nurse Communication: Mobility status;Other (comment) (tender Left foot) PT Visit Diagnosis: Other abnormalities of gait and mobility (R26.89);Difficulty in walking, not elsewhere classified (R26.2);Muscle weakness (generalized) (M62.81)     Time: 8381-8403 PT Time Calculation (min) (ACUTE ONLY): 19 min  Charges:    NO charge (co-treatment with OT billing pt)                     Arby Barrette, [place]  Pager [phone] Office [phone]    Rexanne Mano 03/01/2021, 10:13 AM

## 2021-03-01 NOTE — TOC Transition Note (Signed)
Transition of Care [facility]) - CM/SW Discharge Note   Patient Details  Name: Shannon Terry MRN: [id number] Date of Birth: 03-27-1940  Transition of Care [facility] CM/SW Contact:  Ninfa Meeker, RN Phone Number: 03/01/2021, 11:28 AM   Clinical Narrative:  Case manager has spoken with patient's daughter, [name] concerning discharge plan. She states the home is ready for her mom and [facility] is scheduled to see her on tomorrow. CM contacted [facility] to schedule transport home for patient, pickup time is 12:30pm. Patient's bedside RN and family are aware of transport time.      Barriers to Discharge: Continued Medical Work up   Patient Goals and CMS Choice Patient states their goals for this hospitalization and ongoing recovery are:: return home CMS Medicare.gov Compare Post Acute Care list provided to:: Patient Represenative (must comment) Choice offered to / list presented to : Adult Children  Discharge Placement                       Discharge Plan and Services   Discharge Planning Services: CM Consult Post Acute Care Choice: Hospice                      [facility] Agency: Hospice and [place] Date [place]: 02/28/21 Time [place]: 3825 Representative spoke with at [place]: Farrel Gordon  Social Determinants of Health ([place]) Interventions     Readmission Risk Interventions No flowsheet data found.

## 2021-03-01 NOTE — Discharge Summary (Addendum)
Discharge Summary  Shannon Terry WVP:[id number] DOB: July 04, 1940  PCP: Patient, No Pcp Per (Inactive)  Admit date: 02/22/2021 Discharge date: 03/01/2021  Time spent: 35 minutes  Recommendations for Outpatient Follow-up:  Continue with hospice care.  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    Discharge Condition: Stable.  Diet recommendation: Pleasure feedings.  Vitals:   03/01/21 0510 03/01/21 0753  BP: 96/68 100/72  Pulse: 100 93  Resp: 18 19  Temp: 98.2 F (36.8 C) (!) 97.5 F (36.4 C)  SpO2: 100% 99%    History of present illness:  Patient is a 80 year old female with history of rectal GIST tumor diagnosed in 2014, not following with oncology /not taking any treatment, frequent hospitalization for severe blood loss anemia from lower GI bleed from the tumor, history of CVA, chronic diastolic congestive heart failure, hypertension, CKD stage IIIb who presented to [facility] ED with complaints of generalized weakness, diarrhea.  She was weak, unable to ambulate, lightheaded.  On presentation her hemoglobin was 3.7K.  Patient was transfused with 3 units of PRBCs.  She has declined any further work-up for her rectal tumor/anemia and does not have plan to follow-up with her oncologist(Dr Ennever).    Hospital course complicated by medical noncompliance with refusal to eat or take her medications orally.  Also complicated by right upper extremity extensive DVT  noted on Doppler ultrasound done on 02/25/2021 for which she was started on heparin drip.  Heparin drip was switched to Eliquis.    Seen by palliative care team, DNR.  Goals of care discussion held with palliative care team and family, decision made to discharge to home with hospice care.   03/01/2021: Patient was seen at bedside.  Her daughter was present in the room.  She is resting peacefully.  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  Hemoglobin now stable 12.1 on 03/01/2021. -No overt bleeding   Rectal GIST tumor:  -Previously following with oncology, Dr. Marin Olp.  Diagnosed in 2016.  Was on imatinib in the past.  Does not follow with her oncologist anymore because she does not want to seek the treatment.   Continue outpatient palliative/hospice care.   Klebsiella  UTI: -Urinary culture positive for Klebsiella UTI.  Sensitive to Keflex -Continue Keflex twice a day for 7 days   Newly diagnosed right upper extremity DVT in the setting of recent lower GI bleed Continue Eliquis Previous provider discussed with daughter she would like to continue Eliquis  Resolved hypokalemia: Potassium 3.5.   Resolved hypocalcemia -Corrected calcium for albumin 9.1   Leukocytosis in the setting of malignancy and DVT:  -Patient remained afebrile.  Leukocytosis downtrending 14,000 from 16,000.   -Urine culture is positive for gram-negative rods.  Currently on Keflex, continue and  complete course.   AKI on CKD 3B, suspect prerenal in the setting of dehydration from poor oral intake -Baseline creatinine appears to be 1.5 with GFR 34 -Creatinine peaked at 2.21, GFR 22, slightly improved to 2.18/23.  Patient refusing IV fluids, has decreased p.o. intake as well   Suspected gout flare left lower extremity foot and ankle Colchicine 0.6 mg x 1 on 03/01/21 Colchicine 0.3 mg daily x 5 days from 03/02/21 Follow up with hospice care   Chronic diastolic congestive heart failure:  -As to the echo done on 05/05/2020 revealed LVEF 55% with grade 1 diastolic dysfunction   Severe protein calorie malnutrition: Albumin of less than 1.5. -Secondary to malignancy, poor nutrition -Dietitian consulted however patient did not want to discuss nutrition related history with dietitian and declined all nutrition intervention. -She is depressed.  Offered mirtazapine however she refused.   Disposition: Patient is not safe to go home as she needs 24/7 supportive care.  she does not want to go to SNF.  Patient's daughter Anne's FMLA paperwork completed today.  Daughter wishes to take patient home with hospice.  She would like to continue Eliquis for right upper extremity DVT. Oxycodone as needed for severe pain.  P.o. Ativan as needed for end-of-life related anxiety.  As needed MiraLAX for constipation.     DVT prophylaxis: Eliquis  Code Status: DNR  Disposition Plan: Home with home hospice    consultants:  Palliative care   Procedures:  None   Antimicrobials:  Keflex     Discharge Exam: BP 100/72 (BP Location: Left Arm)    Pulse 93    Temp (!) 97.5 F (36.4 C) (Oral)    Resp 19    Ht 5\' 4"  (1.626 m)    Wt 57.8 kg    SpO2 99%    BMI 21.87 kg/m  General: 80 y.o. year-old female well developed well nourished in no acute distress.  Sleeping resting peacefully. Cardiovascular: Regular rate and rhythm with no rubs or gallops.  No thyromegaly or JVD noted.   Respiratory: Clear to  auscultation with no wheezes or rales. Abdomen: Soft nontender nondistended  Musculoskeletal: No lower extremity edema Psychiatry: Asleep.  Discharge Instructions You were cared for by a hospitalist during your hospital stay. If you have any questions about your discharge medications or the care you received while you were in the hospital after you are discharged, you can call the unit and asked to speak with the hospitalist on call if the hospitalist that took care of you is not available. Once you are discharged, your primary care physician will handle any further medical issues. Please note that NO REFILLS for any discharge medications will be authorized once you are discharged, as it is imperative that you return to your primary care physician (or establish a relationship with a primary care physician if you do not have one) for your aftercare needs so that they can reassess your need for medications and monitor your lab values.   Allergies as of 03/01/2021   No Known Allergies      Medication List     STOP taking these medications    acetaminophen 325 MG tablet Commonly known as: TYLENOL   aspirin  EC 81 MG tablet   ferrous sulfate 325 (65 FE) MG EC tablet   pantoprazole 40 MG tablet Commonly known as: Protonix       TAKE these medications    Apixaban Starter Pack (10mg  and 5mg ) Commonly known as: ELIQUIS STARTER PACK Take as directed on package: start with two-5mg  tablets twice daily for 7 days. On day 8, switch to one-5mg  tablet twice daily. Notes to patient: Take tonight @ bedtime 03/01/21   cephALEXin 500 MG capsule Commonly known as: KEFLEX Take 1 capsule (500 mg total) by mouth every 12 (twelve) hours for 4 days. Notes to patient: Take tonight @ bedtime 03/01/21   colchicine 0.6 MG tablet Take 0.5 tablets (0.3 mg total) by mouth daily for 5 days. Start taking on: March 02, 2021   feeding supplement Liqd Take 237 mLs by mouth 2 (two) times daily between  meals for 7 days.   LORazepam 0.5 MG tablet Commonly known as: Ativan Take 1 tablet (0.5 mg total) by mouth every 8 (eight) hours as needed for up to 3 days for anxiety.   oxyCODONE 5 MG immediate release tablet Commonly known as: Roxicodone Take 1 tablet (5 mg total) by mouth every 6 (six) hours as needed for up to 3 days for severe pain.   polyethylene glycol 17 g packet Commonly known as: MIRALAX / GLYCOLAX Take 17 g by mouth daily as needed for mild constipation.               Durable Medical Equipment  (From admission, onward)           Start     Ordered   03/01/21 1117  For home use only DME Hospital bed  Once       Question Answer Comment  Length of Need 6 Months   Head must be elevated greater than: 30 degrees   Bed type Semi-electric   Hoyer Lift Yes   Support Surface: Gel Overlay      03/01/21 1116           No Known Allergies    The results of significant diagnostics from this hospitalization (including imaging, microbiology, ancillary and laboratory) are listed below for reference.    Significant Diagnostic Studies: VAS [place] UPPER EXTREMITY VENOUS DUPLEX  Result Date: 02/25/2021 UPPER VENOUS STUDY  Patient Name:  SHEARON CLONCH  Date of Exam:   02/25/2021 Medical Rec #: [id number]   Accession #:    [id number] Date of Birth: 1940-12-12   Patient Gender: F Patient Age:   39 years Exam Location:  [facility] Procedure:      VAS [place] UPPER EXTREMITY VENOUS DUPLEX Referring Phys: Irene Pap --------------------------------------------------------------------------------  Indications: Edema Risk Factors: Cancer malignant GIST. Comparison Study: No prior studies. Performing Technologist: Darlin Coco RDMS, RVT  Examination Guidelines: A complete evaluation includes B-mode imaging, spectral Doppler, color Doppler, and power Doppler as needed of all accessible portions of each vessel. Bilateral testing is considered an integral part of a complete examination.  Limited examinations for reoccurring indications may be performed as noted.  Right Findings: +----------+------------+---------+-----------+----------+---------------------+  RIGHT      Compressible Phasicity Spontaneous Properties        Summary         +----------+------------+---------+-----------+----------+---------------------+  IJV            Full        Yes        Yes                                       +----------+------------+---------+-----------+----------+---------------------+  Subclavian     None        No         No                         Acute          +----------+------------+---------+-----------+----------+---------------------+  Axillary       None        No         No                         Acute          +----------+------------+---------+-----------+----------+---------------------+  Brachial       None        No         No                         Acute          +----------+------------+---------+-----------+----------+---------------------+  Radial         Full                                                             +----------+------------+---------+-----------+----------+---------------------+  Ulnar          None        No         No                         Acute          +----------+------------+---------+-----------+----------+---------------------+  Cephalic       None        No         No                  Continuous flow in                                                                   upper arm,                                                                   non-compressible in                                                                    forearm         +----------+------------+---------+-----------+----------+---------------------+  Basilic        None        No  No                                        +----------+------------+---------+-----------+----------+---------------------+  Left Findings:  +----------+------------+---------+-----------+----------+-------+  LEFT       Compressible Phasicity Spontaneous Properties Summary  +----------+------------+---------+-----------+----------+-------+  Subclavian     Full        Yes        Yes                         +----------+------------+---------+-----------+----------+-------+  Summary:  Right: Findings consistent with extensive, occlusive acute deep vein thrombosis involving the right subclavian vein, right axillary vein, right brachial veins and right ulnar veins. Findings consistent with acute superficial vein thrombosis involving the right basilic vein and right cephalic vein.  Left: No evidence of thrombosis in the subclavian.  *See table(s) above for measurements and observations.  Diagnosing physician: Jamelle Haring Electronically signed by Jamelle Haring on 02/25/2021 at 2:43:42 PM.    Final     Microbiology: Recent Results (from the past 240 hour(s))  Resp Panel by RT-PCR (Flu A&B, Covid) Nasopharyngeal Swab     Status: None   Collection Time: 02/22/21  7:22 PM   Specimen: Nasopharyngeal Swab; Nasopharyngeal(NP) swabs in vial transport medium  Result Value Ref Range Status   SARS Coronavirus 2 by RT PCR NEGATIVE NEGATIVE Final    Comment: (NOTE) SARS-CoV-2 target nucleic acids are NOT DETECTED.  The SARS-CoV-2 RNA is generally detectable in upper respiratory specimens during the acute phase of infection. The lowest concentration of SARS-CoV-2 viral copies this assay can detect is 138 copies/mL. A negative result does not preclude SARS-Cov-2 infection and should not be used as the sole basis for treatment or other patient management decisions. A negative result may occur with  improper specimen collection/handling, submission of specimen other than nasopharyngeal swab, presence of viral mutation(s) within the areas targeted by this assay, and inadequate number of viral copies(<138 copies/mL). A negative result must be combined  with clinical observations, patient history, and epidemiological information. The expected result is Negative.  Fact Sheet for Patients:  EntrepreneurPulse.com.au  Fact Sheet for Healthcare Providers:  IncredibleEmployment.be  This test is no t yet approved or cleared by the [place] FDA and  has been authorized for detection and/or diagnosis of SARS-CoV-2 by FDA under an Emergency Use Authorization (EUA). This EUA will remain  in effect (meaning this test can be used) for the duration of the COVID-19 declaration under Section 564(b)(1) of the Act, 21 U.S.C.section 360bbb-3(b)(1), unless the authorization is terminated  or revoked sooner.       Influenza A by PCR NEGATIVE NEGATIVE Final   Influenza B by PCR NEGATIVE NEGATIVE Final    Comment: (NOTE) The Xpert Xpress SARS-CoV-2/FLU/RSV plus assay is intended as an aid in the diagnosis of influenza from Nasopharyngeal swab specimens and should not be used as a sole basis for treatment. Nasal washings and aspirates are unacceptable for Xpert Xpress SARS-CoV-2/FLU/RSV testing.  Fact Sheet for Patients: EntrepreneurPulse.com.au  Fact Sheet for Healthcare Providers: IncredibleEmployment.be  This test is not yet approved or cleared by the [place] FDA and has been authorized for detection and/or diagnosis of SARS-CoV-2 by FDA under an Emergency Use Authorization (EUA). This EUA will remain in effect (meaning this test can be used) for the duration of the COVID-19 declaration under Section 564(b)(1) of the  Act, 21 U.S.C. section 360bbb-3(b)(1), unless the authorization is terminated or revoked.  Performed at [facility], [place] [address], [place], [place] [address]   Urine Culture     Status: Abnormal   Collection Time: 02/25/21  1:37 AM   Specimen: Urine, Clean Catch  Result Value Ref Range Status   Specimen Description URINE, CLEAN CATCH   Final   Special Requests   Final    NONE Performed at [facility], [place] [address], [place], [place] [address]    Culture >=100,000 COLONIES/mL KLEBSIELLA PNEUMONIAE (A)  Final   Report Status 02/27/2021 FINAL  Final   Organism ID, Bacteria KLEBSIELLA PNEUMONIAE (A)  Final      Susceptibility   Klebsiella pneumoniae - MIC*    AMPICILLIN RESISTANT Resistant     CEFAZOLIN <=4 SENSITIVE Sensitive     CEFEPIME <=0.12 SENSITIVE Sensitive     CEFTRIAXONE <=0.25 SENSITIVE Sensitive     CIPROFLOXACIN <=0.25 SENSITIVE Sensitive     GENTAMICIN <=1 SENSITIVE Sensitive     IMIPENEM <=0.25 SENSITIVE Sensitive     NITROFURANTOIN 32 SENSITIVE Sensitive     TRIMETH/SULFA <=20 SENSITIVE Sensitive     AMPICILLIN/SULBACTAM <=2 SENSITIVE Sensitive     PIP/TAZO <=4 SENSITIVE Sensitive     * >=100,000 COLONIES/mL KLEBSIELLA PNEUMONIAE     Labs: Basic Metabolic Panel: Recent Labs  Lab 02/23/21 0500 02/24/21 0058 02/25/21 0058 02/27/21 0526 02/28/21 0133  NA 138 136 137 137 139  K 3.9 3.6 3.3* 3.9 3.5  CL 115* 111 106 103 101  CO2 17* 17* 24 25 26   GLUCOSE 102* 88 56* 86 69*  BUN 32* 33* 30* 39* 41*  CREATININE 2.21* 2.09* 2.02* 2.18* 2.18*  CALCIUM 7.0* 7.0* 6.7* 7.1* 7.3*  MG 2.0  --  1.9 1.8  --   PHOS  --   --  3.4  --   --    Liver Function Tests: Recent Labs  Lab 02/22/21 1909 02/23/21 0500 02/25/21 0058 02/27/21 0526  AST 23 21 22 16   ALT 25 28 23 21   ALKPHOS 75 87 91 102  BILITOT 0.9 1.8* 1.2 1.0  PROT 4.4* 4.6* 4.7* 4.4*  ALBUMIN <1.5* 1.6* 1.5* <1.5*   No results for input(s): LIPASE, AMYLASE in the last 168 hours. No results for input(s): AMMONIA in the last 168 hours. CBC: Recent Labs  Lab 02/22/21 1909 02/23/21 0500 02/24/21 0058 02/25/21 0058 02/26/21 0251 02/27/21 0526 02/28/21 0133 03/01/21 0125  WBC 13.4*   < > 16.5* 14.6* 16.7* 16.1* 16.1* 14.0*  NEUTROABS 9.8*  --  12.1* 10.3*  --   --   --   --   HGB 3.7*   < > 12.9 12.5 12.8 11.5* 12.3 12.1   HCT 15.0*   < > 40.0 39.8 39.7 38.8 41.4 39.4  MCV 51.2*   < > 67.7* 67.5* 68.3* 71.3* 71.8* 71.0*  PLT 338   < > 255 215 197 180 173 184   < > = values in this interval not displayed.   Cardiac Enzymes: No results for input(s): CKTOTAL, CKMB, CKMBINDEX, TROPONINI in the last 168 hours. BNP: BNP (last 3 results) No results for input(s): BNP in the last 8760 hours.  ProBNP (last 3 results) No results for input(s): PROBNP in the last 8760 hours.  CBG: Recent Labs  Lab 02/28/21 1609 02/28/21 2113 03/01/21 0247 03/01/21 0519 03/01/21 0808  GLUCAP 88 98 82 72 95  Signed:  Kayleen Memos, MD [facility] 03/01/2021, 11:32 AM

## 2021-03-01 NOTE — Procedures (Signed)
[facility]) Hospital Liaison Note  Please send signed and completed DNR home with patient/family. Please provide prescriptions at discharge as needed to ensure ongoing symptom management.    [facility] information and contact numbers given to Above information shared with TOC.   Please call with any questions/concerns.    Thank you for the opportunity to participate in this patient's care.   Daphene Calamity, MSW [facility] Liaison  [phone]

## 2021-05-04 DEATH — deceased
